# Patient Record
Sex: Male | Born: 1945 | Race: White | Hispanic: No | Marital: Married | Smoking: Former smoker
Health system: Southern US, Community
[De-identification: ages and names within clinical notes are randomized; demographics above are authoritative.]

## PROBLEM LIST (undated history)

## (undated) DIAGNOSIS — M1990 Unspecified osteoarthritis, unspecified site: Secondary | ICD-10-CM

## (undated) DIAGNOSIS — R351 Nocturia: Secondary | ICD-10-CM

## (undated) DIAGNOSIS — N210 Calculus in bladder: Secondary | ICD-10-CM

## (undated) DIAGNOSIS — N201 Calculus of ureter: Secondary | ICD-10-CM

## (undated) DIAGNOSIS — Z973 Presence of spectacles and contact lenses: Secondary | ICD-10-CM

## (undated) DIAGNOSIS — I10 Essential (primary) hypertension: Secondary | ICD-10-CM

## (undated) DIAGNOSIS — N400 Enlarged prostate without lower urinary tract symptoms: Secondary | ICD-10-CM

## (undated) DIAGNOSIS — I4719 Other supraventricular tachycardia: Secondary | ICD-10-CM

## (undated) DIAGNOSIS — Z87442 Personal history of urinary calculi: Secondary | ICD-10-CM

## (undated) DIAGNOSIS — R350 Frequency of micturition: Secondary | ICD-10-CM

## (undated) DIAGNOSIS — I4891 Unspecified atrial fibrillation: Secondary | ICD-10-CM

## (undated) DIAGNOSIS — F329 Major depressive disorder, single episode, unspecified: Secondary | ICD-10-CM

## (undated) DIAGNOSIS — F32A Depression, unspecified: Secondary | ICD-10-CM

## (undated) DIAGNOSIS — Z9189 Other specified personal risk factors, not elsewhere classified: Secondary | ICD-10-CM

## (undated) DIAGNOSIS — C61 Malignant neoplasm of prostate: Secondary | ICD-10-CM

## (undated) DIAGNOSIS — I471 Supraventricular tachycardia: Secondary | ICD-10-CM

## (undated) DIAGNOSIS — I714 Abdominal aortic aneurysm, without rupture, unspecified: Secondary | ICD-10-CM

## (undated) DIAGNOSIS — C3492 Malignant neoplasm of unspecified part of left bronchus or lung: Secondary | ICD-10-CM

## (undated) DIAGNOSIS — E785 Hyperlipidemia, unspecified: Secondary | ICD-10-CM

## (undated) HISTORY — DX: Hyperlipidemia, unspecified: E78.5

## (undated) HISTORY — DX: Abdominal aortic aneurysm, without rupture: I71.4

## (undated) HISTORY — DX: Major depressive disorder, single episode, unspecified: F32.9

## (undated) HISTORY — DX: Abdominal aortic aneurysm, without rupture, unspecified: I71.40

## (undated) HISTORY — DX: Depression, unspecified: F32.A

---

## 1990-12-12 HISTORY — PX: CHOLECYSTECTOMY OPEN: SUR202

## 2005-05-31 ENCOUNTER — Other Ambulatory Visit: Admission: RE | Admit: 2005-05-31 | Discharge: 2005-05-31 | Payer: Self-pay | Admitting: Family Medicine

## 2006-10-17 ENCOUNTER — Other Ambulatory Visit: Admission: RE | Admit: 2006-10-17 | Discharge: 2006-10-17 | Payer: Self-pay | Admitting: Family Medicine

## 2010-06-04 ENCOUNTER — Ambulatory Visit: Payer: Self-pay | Admitting: Vascular Surgery

## 2011-04-26 NOTE — Consult Note (Signed)
NEW PATIENT CONSULTATION   Russell [name]  DOB:  November 17, 1946                                       06/04/2010  [id number]#:[id number]   Patient presents today for evaluation of recent finding of above  abdominal aortic aneurysm.  He is a very active, pleasant gentleman who  was found to have hematuria.  He saw Dr. Bjorn Pippin for a workup of  this, and this included a CT scan which showed a 3.8 cm infrarenal  abdominal aortic aneurysm.  He has no known history of aneurysm, did  have a cerebral aneurysm in his father but otherwise no familial history  of aneurysm.   Past history is significant for depression, hypercholesterolemia.  He  denies any cardiac difficulty.   Surgical history is significant for a history of cholecystectomy.   Medications are listed in his chart.   He has no known allergies.   Review of systems is also documented in his chart.   SOCIAL HISTORY:  He does continue to drink alcohol.  He is a former  smoker who quit 24 years ago.   PHYSICAL EXAMINATION:  A well-nourished white male appearing stated age  of 88.  Blood pressure is 150/81, pulse 75, respirations 16.  He is in  no acute stress.  HEENT is normal.  Chest is clear bilaterally with no  rhonchi or wheezes.  Heart is a regular rate and rhythm without murmurs.  His carotid arteries are normal pulses without bruits bilaterally.  Abdominal exam is soft, nontender.  I do not palpate a palpable mass.  Musculoskeletal shows no major deformities or cyanosis.  Neurologic:  No  focal weakness or paresthesias.  Skin is without ulcers or rashes.   I reviewed his CT scan, and this does show some ectasia and mural  thrombus just at the level of the renal arteries and below.  He does  have a dilatation to 3.8 cm in the mid to distal aorta above the level  of the bifurcation.   I discussed this at length with patient, explaining that he is at  minimal, if any risk, for rupture with his very  small aneurysm.  I did  explain the natural history of aneurysms with their potential growth and  have recommended that we see him again in 1 year for ultrasound follow-  up.  I did review symptoms of aneurysm leak with him.  He knows to  report immediately to [facility] Room should this occur.     Larina Earthly, M.D.  Electronically Signed   TFE/MEDQ  D:  06/04/2010  T:  06/04/2010  Job:  [id number]   cc:   Russell [name], M.D.  Russell [name], M.D.

## 2011-06-07 ENCOUNTER — Ambulatory Visit: Payer: Self-pay | Admitting: Vascular Surgery

## 2011-06-07 ENCOUNTER — Encounter (INDEPENDENT_AMBULATORY_CARE_PROVIDER_SITE_OTHER): Payer: 59

## 2011-06-07 DIAGNOSIS — I714 Abdominal aortic aneurysm, without rupture: Secondary | ICD-10-CM

## 2011-06-13 NOTE — Procedures (Unsigned)
DUPLEX ULTRASOUND OF ABDOMINAL AORTA  INDICATION:  Follow up abdominal aortic aneurysm.  HISTORY: Diabetes:  No. Cardiac:  No. Hypertension:  No. Smoking:  Previous. Connective Tissue Disorder: Family History:  Brother. Previous Surgery:  No.  DUPLEX EXAM:         AP (cm)                   TRANSVERSE (cm) Proximal             1.79 cm                   1.77 cm Mid                  2.93 cm                   2.71 cm Distal               3.46 cm                   3.59 cm Right Iliac          1.01 cm                   0.90 cm Left Iliac           0.93 cm                   0.93 cm  PREVIOUS:  Date: 04/12/2010  (CT)  AP:  3.8  TRANSVERSE:  IMPRESSION: 1. No evidence of significant changes in abdominal aortic aneurysm     from CT done on 04/12/2010. 2. Aortic and common iliac artery velocities appear within normal     limits.  ___________________________________________ Larina Earthly, M.D.  AS/MEDQ  D:  06/07/2011  T:  06/07/2011  Job:  [id number]

## 2012-05-28 ENCOUNTER — Encounter: Payer: Self-pay | Admitting: Vascular Surgery

## 2012-06-11 ENCOUNTER — Encounter: Payer: Self-pay | Admitting: Vascular Surgery

## 2012-06-11 ENCOUNTER — Encounter: Payer: Self-pay | Admitting: Neurosurgery

## 2012-06-12 ENCOUNTER — Ambulatory Visit: Payer: 59 | Admitting: Neurosurgery

## 2012-06-18 ENCOUNTER — Encounter: Payer: Self-pay | Admitting: Neurosurgery

## 2012-06-19 ENCOUNTER — Ambulatory Visit (INDEPENDENT_AMBULATORY_CARE_PROVIDER_SITE_OTHER): Payer: PRIVATE HEALTH INSURANCE | Admitting: Neurosurgery

## 2012-06-19 ENCOUNTER — Encounter: Payer: Self-pay | Admitting: Neurosurgery

## 2012-06-19 ENCOUNTER — Ambulatory Visit (INDEPENDENT_AMBULATORY_CARE_PROVIDER_SITE_OTHER): Payer: PRIVATE HEALTH INSURANCE | Admitting: *Deleted

## 2012-06-19 VITALS — BP 153/83 | HR 66 | Resp 16 | Ht 72.0 in | Wt 197.2 lb

## 2012-06-19 DIAGNOSIS — I714 Abdominal aortic aneurysm, without rupture, unspecified: Secondary | ICD-10-CM

## 2012-06-19 NOTE — Progress Notes (Signed)
[facility] AAA/PAD/PVD Office Note  CC: Annual AAA duplex Referring Physician: Early  History of Present Illness: 66 year old male patient of Dr. Arbie Cookey followed for known AAA. Patient reports no unusual abdominal or back pain. He does state that he has some concern about his blood pressure which is 153/83 today. He will followup with his primary care regarding this issue. Patient reports no other medical diagnoses or recent surgeries, he also has no other vascular issues.  Past Medical History  Diagnosis Date  . AAA (abdominal aortic aneurysm)   . Hyperlipidemia   . Depression     ROS: [x]  Positive   [ ]  Denies    General: [ ]  Weight loss, [ ]  Fever, [ ]  chills Neurologic: [ ]  Dizziness, [ ]  Blackouts, [ ]  Seizure [ ]  Stroke, [ ]  "Mini stroke", [ ]  Slurred speech, [ ]  Temporary blindness; [ ]  weakness in arms or legs, [ ]  Hoarseness Cardiac: [ ]  Chest pain/pressure, [ ]  Shortness of breath at rest [ ]  Shortness of breath with exertion, [ ]  Atrial fibrillation or irregular heartbeat Vascular: [ ]  Pain in legs with walking, [ ]  Pain in legs at rest, [ ]  Pain in legs at night,  [ ]  Non-healing ulcer, [ ]  Blood clot in vein/DVT,   Pulmonary: [ ]  Home oxygen, [ ]  Productive cough, [ ]  Coughing up blood, [ ]  Asthma,  [ ]  Wheezing Musculoskeletal:  [ ]  Arthritis, [ ]  Low back pain, [ ]  Joint pain Hematologic: [ ]  Easy Bruising, [ ]  Anemia; [ ]  Hepatitis Gastrointestinal: [ ]  Blood in stool, [ ]  Gastroesophageal Reflux/heartburn, [ ]  Trouble swallowing Urinary: [ ]  chronic Kidney disease, [ ]  on HD - [ ]  MWF or [ ]  TTHS, [ ]  Burning with urination, [ ]  Difficulty urinating Skin: [ ]  Rashes, [ ]  Wounds Psychological: [ ]  Anxiety, [ ]  Depression   Social History History  Substance Use Topics  . Smoking status: Former Smoker    Types: Cigarettes    Quit date: 05/28/1986  . Smokeless tobacco: Not on file  . Alcohol Use: Yes    Family History Family  History  Problem Relation Age of Onset  . Anuerysm Father     No Known Allergies  Current Outpatient Prescriptions  Medication Sig Dispense Refill  . aspirin 81 MG tablet Take 81 mg by mouth daily.      Marland Kitchen FLUoxetine (PROZAC) 20 MG capsule Take 20 mg by mouth daily.      Marland Kitchen GLUCOSAMINE PO Take by mouth.      . Omega-3 Fatty Acids (FISH OIL) 1000 MG CAPS Take 1,000 mg by mouth daily.      . simvastatin (ZOCOR) 40 MG tablet Take 40 mg by mouth daily.        Physical Examination  Filed Vitals:   06/19/12 0908  BP: 153/83  Pulse: 66  Resp: 16    Body mass index is 26.75 kg/(m^2).  General:  WDWN in NAD Gait: Normal HEENT: WNL Eyes: Pupils equal Pulmonary: normal non-labored breathing , without Rales, rhonchi,  wheezing Cardiac: RRR, without  Murmurs, rubs or gallops; No carotid bruits Abdomen: soft, NT, no masses Skin: no rashes, ulcers noted Vascular Exam/Pulses: 2+ radial pulses bilaterally, femoral pulses are palpable no abdominal mass is palpated  Extremities without ischemic changes, no Gangrene , no cellulitis; no open wounds;  Musculoskeletal: no muscle wasting or atrophy  Neurologic: A&O X 3; Appropriate Affect ; SENSATION: normal; MOTOR FUNCTION:  moving all extremities equally. Speech is fluent/normal  Non-Invasive Vascular Imaging: Duplex of AAA today shows sac size to be 3.7 x 3.6 which is consistent with previous CT scan from 2011.  ASSESSMENT/PLAN: Asymptomatic AAA patient that will followup in one year with repeat AAA duplex. The patient knows signs and symptoms of rupture and knows to report to the nearest emergency department should that occur. The patient's questions were encouraged and answered. Next  Lauree Chandler ANP  Clinic M.D.: [facility] [place]

## 2012-06-19 NOTE — Addendum Note (Signed)
Addended by: Sharee Pimple on: 06/19/2012 10:45 AM   Modules accepted: Orders

## 2012-06-26 NOTE — Procedures (Unsigned)
DUPLEX ULTRASOUND OF ABDOMINAL AORTA  INDICATION:  Follow-up AAA.  HISTORY: Diabetes:  No Cardiac:  No Hypertension:  No Smoking:  Previous Connective Tissue Disorder: Family History:  Brother Previous Surgery:  No  DUPLEX EXAM:         AP (cm)                   TRANSVERSE (cm) Proximal             3.14 cm Mid                  See below Distal               1.54 cm                   1.60 cm Right Iliac          0.86 cm Left Iliac           0.95 cm  PREVIOUS:  Date: 06/07/2011  AP:  3.46  TRANSVERSE:  3.59  IMPRESSION:  Today's aortic diameter measures 3.7 cm x 3.6 cm; however, bowel gas is obstructing what is likely the largest segment of the aorta.  ___________________________________________ Larina Earthly, M.D.  LT/MEDQ  D:  06/19/2012  T:  06/19/2012  Job:  [id number]

## 2013-06-19 ENCOUNTER — Encounter (INDEPENDENT_AMBULATORY_CARE_PROVIDER_SITE_OTHER): Payer: Medicare Other | Admitting: *Deleted

## 2013-06-19 ENCOUNTER — Ambulatory Visit: Payer: PRIVATE HEALTH INSURANCE | Admitting: Neurosurgery

## 2013-06-19 DIAGNOSIS — I714 Abdominal aortic aneurysm, without rupture: Secondary | ICD-10-CM

## 2013-06-21 ENCOUNTER — Other Ambulatory Visit: Payer: Self-pay | Admitting: *Deleted

## 2013-06-21 DIAGNOSIS — I714 Abdominal aortic aneurysm, without rupture: Secondary | ICD-10-CM

## 2013-06-25 ENCOUNTER — Encounter: Payer: Self-pay | Admitting: Vascular Surgery

## 2014-06-30 ENCOUNTER — Encounter: Payer: Self-pay | Admitting: Family

## 2014-07-01 ENCOUNTER — Other Ambulatory Visit (HOSPITAL_COMMUNITY): Payer: Medicare Other

## 2014-07-01 ENCOUNTER — Ambulatory Visit: Payer: Medicare Other | Admitting: Family

## 2015-01-26 ENCOUNTER — Ambulatory Visit (INDEPENDENT_AMBULATORY_CARE_PROVIDER_SITE_OTHER): Payer: Medicare Other | Admitting: Internal Medicine

## 2015-01-26 ENCOUNTER — Other Ambulatory Visit (INDEPENDENT_AMBULATORY_CARE_PROVIDER_SITE_OTHER): Payer: Medicare Other

## 2015-01-26 ENCOUNTER — Encounter (INDEPENDENT_AMBULATORY_CARE_PROVIDER_SITE_OTHER): Payer: Self-pay

## 2015-01-26 ENCOUNTER — Encounter: Payer: Self-pay | Admitting: Internal Medicine

## 2015-01-26 VITALS — BP 152/88 | HR 72 | Temp 97.7°F | Ht 72.0 in | Wt 193.0 lb

## 2015-01-26 DIAGNOSIS — R918 Other nonspecific abnormal finding of lung field: Secondary | ICD-10-CM

## 2015-01-26 DIAGNOSIS — I714 Abdominal aortic aneurysm, without rupture, unspecified: Secondary | ICD-10-CM

## 2015-01-26 LAB — CREATININE, SERUM: Creatinine, Ser: 1.01 mg/dL (ref 0.40–1.50)

## 2015-01-26 LAB — BUN: BUN: 19 mg/dL (ref 6–23)

## 2015-01-26 NOTE — Progress Notes (Signed)
Subjective:     Patient ID: Russell [name], male   DOB: 1946-02-25, 69 y.o.   MRN: [id number]  HPI  89 yowm quit smoking 2009 referred by Dr Irine Seal 01/26/2015 to pulmonary clinic for eval of incidental lung mass on CT abd/ primary care per Theadore Nan   01/26/2015 [facility] office visit/ Wert   Chief Complaint  Patient presents with  . Advice Only    Ref by Dr. Jeffie Pollock. nodule seen on scan; not having any SOB, cough, f/n/v/d/  no symptoms and able to yardwork fine/ no problem with walks / hills Nodule in LLL not seen abd  CT 2011 (last xray per pt)  abd pain from stones resolved   No assoc chronic cough or cp or chest tightness, subjective wheeze overt sinus or hb symptoms. No unusual exp hx or h/o childhood pna/ asthma or knowledge of premature birth.  Sleeping ok without nocturnal  or early am exacerbation  of respiratory  c/o's or need for noct saba. Also denies any obvious fluctuation of symptoms with weather or environmental changes or other aggravating or alleviating factors except as outlined above   Current Medications, Allergies, Complete Past Medical History, Past Surgical History, Family History, and Social History were reviewed in [employer] record.  ROS  The following are not active complaints unless bolded sore throat, dysphagia, dental problems, itching, sneezing,  nasal congestion or excess/ purulent secretions, ear ache,   fever, chills, sweats, unintended wt loss, pleuritic or exertional cp, hemoptysis,  orthopnea pnd or leg swelling, presyncope, palpitations, heartburn, abdominal pain, anorexia, nausea, vomiting, diarrhea  or change in bowel or urinary habits, change in stools or urine, dysuria,hematuria,  rash, arthralgias, visual complaints, headache, numbness weakness or ataxia or problems with walking or coordination,  change in mood/affect or memory.          Review of Systems     Objective:   Physical Exam   Pleasant  amb wm   Wt Readings from Last 3 Encounters:  01/26/15 193 lb (87.544 kg)  06/19/12 197 lb 3.2 oz (89.449 kg)    Vital signs reviewed     HEENT: nl dentition, turbinates, and orophanx. Nl external ear canals without cough reflex   NECK :  without JVD/Nodes/TM/ nl carotid upstrokes bilaterally   LUNGS: no acc muscle use, clear to A and P bilaterally without cough on insp or exp maneuvers   CV:  RRR  no s3 or murmur or increase in P2, no edema   ABD:  soft and nontender with nl excursion in the supine position. No bruits or organomegaly, bowel sounds nl  MS:  warm without deformities, calf tenderness, cyanosis or clubbing  SKIN: warm and dry without lesions    NEURO:  alert, approp, no deficits    I personally reviewed images and agree with radiology impression as follows:  CT abd 01/23/15 3.8 x 3.5 cm LLL nodule plus 4.4 cm infrarenal AAA         Assessment:

## 2015-01-26 NOTE — Assessment & Plan Note (Addendum)
-   See CT abd 01/23/15 3.8 x 3.5 cm LLL nodule plus 4.4 cm infrarenal AAA  - Spirometry 01/26/2015 >  FEV1  3.14 (86%) ratio 83   I had an extended discussion with the patient reviewing all relevant studies completed to date and  lasting 40  minutes of a 60  minute visit on the following ongoing concerns:  This lesion is clearly new since 2011 prev abd CT and I agree with radiology it represents bronchogenic ca until proven otherwise in this former smoker.  On the other hand, it may well prove to be resectable for cure given his excellent lung function  Discussed in detail all the  indications, usual  risks and alternatives  relative to the benefits with patient who agrees to proceed with CT chest first then PET then T surgery next if proves resectable as suspected/ emphasizing the best way to firmly establish dx in this case is excisional bx which may also lead to curative resection in same operation and it this setting a neg bx by any other means would be more likely a false neg than a true negative and the posterior probability is quite high.    See instructions for specific recommendations which were reviewed directly with the patient who was given a copy with highlighter outlining the key components.

## 2015-01-26 NOTE — Assessment & Plan Note (Signed)
See CT abd 01/23/15  =  4 cm infrarenal AAA f/u by Dr Early last seen 06/19/12 with no def change in diameter since then

## 2015-01-26 NOTE — Patient Instructions (Signed)
Please see patient coordinator before you leave today  to schedule CT chest and we will call you with results and arrange PET next

## 2015-01-29 ENCOUNTER — Ambulatory Visit (INDEPENDENT_AMBULATORY_CARE_PROVIDER_SITE_OTHER)
Admission: RE | Admit: 2015-01-29 | Discharge: 2015-01-29 | Disposition: A | Discharge status: Home / Self Care | Payer: Medicare Other | Source: Ambulatory Visit | Attending: Internal Medicine | Admitting: Internal Medicine

## 2015-01-29 DIAGNOSIS — R918 Other nonspecific abnormal finding of lung field: Secondary | ICD-10-CM

## 2015-01-29 MED ORDER — IOHEXOL 300 MG/ML  SOLN
80.0000 mL | Freq: Once | INTRAMUSCULAR | Status: AC | PRN
  Administered 2015-01-29: 80 mL via INTRAVENOUS

## 2015-01-30 ENCOUNTER — Other Ambulatory Visit: Payer: Self-pay | Admitting: Internal Medicine

## 2015-01-30 DIAGNOSIS — R918 Other nonspecific abnormal finding of lung field: Secondary | ICD-10-CM

## 2015-02-04 ENCOUNTER — Other Ambulatory Visit: Payer: Self-pay | Admitting: Internal Medicine

## 2015-02-04 ENCOUNTER — Ambulatory Visit (HOSPITAL_COMMUNITY)
Admission: RE | Admit: 2015-02-04 | Discharge: 2015-02-04 | Disposition: A | Discharge status: Home / Self Care | Payer: Medicare Other | Source: Ambulatory Visit | Attending: Internal Medicine | Admitting: Internal Medicine

## 2015-02-04 DIAGNOSIS — R918 Other nonspecific abnormal finding of lung field: Secondary | ICD-10-CM

## 2015-02-04 LAB — GLUCOSE, CAPILLARY: Glucose-Capillary: 103 mg/dL — ABNORMAL HIGH (ref 70–99)

## 2015-02-04 MED ORDER — FLUDEOXYGLUCOSE F - 18 (FDG) INJECTION
9.5000 | Freq: Once | INTRAVENOUS | Status: AC | PRN
  Administered 2015-02-04: 9.5 via INTRAVENOUS

## 2015-02-04 NOTE — Progress Notes (Signed)
Quick Note:  Spoke with pt and notified of results per Dr. Wert. Pt verbalized understanding and denied any questions.  ______ 

## 2015-02-11 ENCOUNTER — Institutional Professional Consult (permissible substitution) (INDEPENDENT_AMBULATORY_CARE_PROVIDER_SITE_OTHER): Payer: Medicare Other | Admitting: Thoracic Surgery (Cardiothoracic Vascular Surgery)

## 2015-02-11 ENCOUNTER — Other Ambulatory Visit: Payer: Self-pay | Admitting: *Deleted

## 2015-02-11 ENCOUNTER — Encounter: Payer: Self-pay | Admitting: Thoracic Surgery (Cardiothoracic Vascular Surgery)

## 2015-02-11 VITALS — BP 132/73 | HR 72 | Resp 16 | Ht 72.0 in | Wt 190.0 lb

## 2015-02-11 DIAGNOSIS — R319 Hematuria, unspecified: Secondary | ICD-10-CM | POA: Insufficient documentation

## 2015-02-11 DIAGNOSIS — J984 Other disorders of lung: Secondary | ICD-10-CM

## 2015-02-11 DIAGNOSIS — R918 Other nonspecific abnormal finding of lung field: Secondary | ICD-10-CM

## 2015-02-11 DIAGNOSIS — I2510 Atherosclerotic heart disease of native coronary artery without angina pectoris: Secondary | ICD-10-CM

## 2015-02-11 NOTE — Progress Notes (Addendum)
PCP is MCNEILL,WENDY, MD Referring Provider is Tanda Rockers, MD  Urologist: Irine Seal, MD  Chief Complaint  Patient presents with  . Lung Mass    LLLobe....Marland KitchenCT CHEST 01/29/15.Marland KitchenPET 02/04/15...only spirometry done    HPI: 69 year old man sent for consultation regarding a left lower lobe mass.  Mr. 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 He has a history of kidney stones and hematuria. He recently had recurrent hematuria. As part of his workup he had a CT of the abdomen and pelvis. A left lower lobe mass was noted on the CT scan. He then had a CT of the chest which showed a 3.5 cm spiculated mass in the left lower lobe. There was some mild hilar adenopathy. There was pulmonary fibrosis noted. A small nodule in the right upper lobe showed calcification. He then had a PET/CT which showed the left lower lobe mass was hypermetabolic. The right upper lobe nodule was not hypermetabolic. There was no evidence of regional or distant metastases.  He says that he walks on a daily basis. He can walk about a mile without any shortness of breath chest pain, pressure, or tightness. He can walk up and down stairs without difficulty. He denies cough, hemoptysis, wheezing, shortness of breath, peripheral edema. His weight is unchanged over the past 3 in 6 months.  Zubrod Score: At the time of surgery this patient's most appropriate activity status/level should be described as: [x]     0    Normal activity, no symptoms []     1    Restricted in physical strenuous activity but ambulatory, able to do out light work []     2    Ambulatory and capable of self care, unable to do work activities, up and about >50 % of waking hours                              []     3    Only limited self care, in bed greater than 50% of waking hours []     4    Completely disabled, no self care, confined to bed or chair []     5     Moribund    Past Medical History  Diagnosis Date  . AAA (abdominal aortic aneurysm)   . Hyperlipidemia   . Depression     Past Surgical History  Procedure Laterality Date  . Cholecystectomy      Family History  Problem Relation Age of Onset  . Anuerysm Father     Social History History  Substance Use Topics  . Smoking status: Former Smoker -- 1.00 packs/day for 50 years    Types: Cigarettes    Quit date: 12/13/2007  . Smokeless tobacco: Never Used  . Alcohol Use: 0.0 oz/week    0 Standard drinks or equivalent per week    Current Outpatient Prescriptions  Medication Sig Dispense Refill  . aspirin 81 MG tablet Take 81 mg by mouth daily.    Marland Kitchen FLUoxetine (PROZAC) 20 MG capsule Take 20 mg by mouth daily.    Marland Kitchen GLUCOSAMINE PO Take by mouth.    . losartan (COZAAR) 50 MG tablet Take 50 mg by mouth daily.    . Omega-3 Fatty Acids (FISH OIL) 1000 MG CAPS Take 1,000 mg by mouth daily.    . simvastatin (ZOCOR) 40 MG tablet  Take 40 mg by mouth daily.     No current facility-administered medications for this visit.    No Known Allergies  Review of Systems  Constitutional: Negative for fever, chills, activity change, appetite change, fatigue and unexpected weight change.  Respiratory: Negative for cough, shortness of breath, wheezing and stridor.   Cardiovascular: Negative for chest pain and leg swelling.  Genitourinary: Positive for hematuria.  Hematological: Negative for adenopathy. Does not bruise/bleed easily.  Psychiatric/Behavioral: Positive for dysphoric mood.  All other systems reviewed and are negative.   BP 132/73 mmHg  Pulse 72  Resp 16  Ht 6' (1.829 m)  Wt 190 lb (86.183 kg)  BMI 25.76 kg/m2  SpO2 97% Physical Exam  Constitutional: He is oriented to person, place, and time. He appears well-developed and well-nourished. No distress.  HENT:  Head: Normocephalic and atraumatic.  Eyes: EOM are normal. Pupils are equal, round, and reactive to light.  Neck:  Neck supple. No thyromegaly present.  Cardiovascular: Normal rate, regular rhythm, normal heart sounds and intact distal pulses.  Exam reveals no gallop and no friction rub.   No murmur heard. Pulmonary/Chest: Effort normal and breath sounds normal. He has no wheezes. He has no rales.  Abdominal: Soft. There is no tenderness.  Musculoskeletal: He exhibits no edema.  Lymphadenopathy:    He has no cervical adenopathy.  Neurological: He is alert and oriented to person, place, and time. No cranial nerve deficit.  Skin: Skin is warm and dry.  Psychiatric: He has a normal mood and affect.  Vitals reviewed.    Diagnostic Tests: NUCLEAR MEDICINE PET SKULL BASE TO THIGH  TECHNIQUE: 9.5 mCi F-18 FDG was injected intravenously. Full-ring PET imaging was performed from the skull base to thigh after the radiotracer. CT data was obtained and used for attenuation correction and anatomic localization.  FASTING BLOOD GLUCOSE: Value: One hundred three mg/dl  COMPARISON: CT chest 01/29/2015 and CT abdomen pelvis 01/23/2015.  FINDINGS: NECK  No hypermetabolic lymph nodes in the neck. CT images show no acute findings.  CHEST  A 3.4 x 4.0 cm spiculated mass in the superior segment left lower lobe has an SUV max of 15.3. No hypermetabolic mediastinal, hilar or axillary lymph nodes. CT images show no acute findings. Coronary artery calcification. Heart is mildly enlarged. No pericardial effusion. Centrilobular emphysema.  ABDOMEN/PELVIS  No abnormal hypermetabolism in the liver, adrenal glands, spleen or pancreas. No hypermetabolic lymph nodes. There is focal hypermetabolism in the low central portion of the prostate. CT images show a 1.5 cm low-attenuation lesion in the left hepatic lobe, unchanged from 04/12/2010, favoring a cyst. Cholecystectomy. Adrenal glands and right kidney are unremarkable. Left renal stones. A 9 mm stone is again seen in the distal left ureter, at the  left ureterovesical junction. 10 mm stone is seen dependently in the bladder, stable. Spleen, pancreas, stomach and bowel are grossly unremarkable. No free fluid. Atherosclerotic calcification of the arterial vasculature. Infrarenal aortic aneurysm measures 4.3 cm.  SKELETON  No abnormal osseous hypermetabolism. Degenerative changes are seen in the spine.  IMPRESSION: 1. Hypermetabolic spiculated left lower lobe mass without evidence of metastatic disease. 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 2. Focal hypermetabolism in the low central prostate is nonspecific. Consider correlation with PSA, as clinically indicated. 3. Coronary artery calcification. 4. Left renal stones. Stones are again seen in the distal left ureter and bladder. 5. Infrarenal aortic aneurysm, as on 01/23/2015.   Electronically  Signed  By: Lorin Picket M.D.  On: 02/04/2015 10:32  Pulmonary function tests   FVC= 3.77 (78%) FEV1= 3.14(86%) FEV1/FVC= 83%  Impression: 69 year old man with a newly discovered 3.5 cm spiculated mass in the left lower lobe. This is hypermetabolic by PET CT. Given his age, smoking history, and appearance of the lesion this is a lung cancer until proven otherwise.  He has excellent functional status. His FEV1 is 86% predicted. He has coronary calcification on CT scan, but is completely asymptomatic. Overall he is an excellent surgical candidate.  I discussed an algorithm for diagnosis and treatment. I discussed possibility of doing a CT-guided biopsy or bronchoscopic biopsy. If those were positive it would not change management. Also they were negative I would not rely on that and would still recommend surgical resection. Therefore see no reason to do a preoperative biopsy.  I recommended that we proceed directly to left VATS, wedge resection, possible left lower lobectomy. I described the general nature of the  procedure to him including the need for general anesthesia, the incisions to be used, the intraoperative decision making, the expected hospital stay, and overall recovery. I reviewed the indications, risks, benefits, and alternatives. He understands the risk include, but are not limited to death, MI, DVT, PE, bleeding, possible need for transfusion, infection, prolonged air leak, cardiac arrhythmias, as well as possibility of unforeseeable competitions. We also discussed the possibility of cryo-analgesia intercostal nerves to assist with postoperative pain management. He does wish to have that procedure done as well.  He understands and accepts the risks and wishes to proceed.  Plan:  Left VATS, wedge resection, possible left lower lobectomy, cryo-analgesia intercostal nerves on Monday, March 14.  Revonda Standard Roxan Hockey, MD [facility] [phone]

## 2015-02-16 ENCOUNTER — Other Ambulatory Visit: Payer: Self-pay | Admitting: Urology

## 2015-02-16 ENCOUNTER — Encounter (HOSPITAL_BASED_OUTPATIENT_CLINIC_OR_DEPARTMENT_OTHER): Payer: Self-pay | Admitting: *Deleted

## 2015-02-16 ENCOUNTER — Ambulatory Visit: Payer: Self-pay | Admitting: Adult Health

## 2015-02-17 ENCOUNTER — Encounter (HOSPITAL_BASED_OUTPATIENT_CLINIC_OR_DEPARTMENT_OTHER): Payer: Self-pay | Admitting: *Deleted

## 2015-02-17 NOTE — Progress Notes (Addendum)
NPO AFTER MN. ARRIVE AT 0700. NEEDS ISTAT AND EKG. WILL TAKE PROZAC AM DOS W/ SIPS OF WATER AND DO FLEET ENEMA.

## 2015-02-18 NOTE — H&P (Signed)
Active Problems Problems  1. Aneurysm Of The Abdominal Aorta 2. Benign prostatic hyperplasia with urinary obstruction (N40.1,N13.8) 3. Bladder calculus (N21.0) 4. Calculus of left ureter (N20.1) 5. Elevated prostate specific antigen (PSA) (R97.2) 6. Mass of left lung (R91.8) 7. Microscopic hematuria (R31.2) 8. Nephrolithiasis (N20.0) 9. Nocturia (R35.1) 10. Nodular prostate with lower urinary tract symptoms (N40.3) 11. Pyuria (N39.0)  History of Present Illness Mr. Kerwin returns today in f/u. He was to have cystoscopy and a prostate biopsy. He was found on CT to have a lung mass and is slated for a resection on Monday. He had a 1cm bladder stone and 61mm non-obstructing left distal stone on the CT as well.  He is currently without complaints.   Past Medical History Problems  1. History of abdominal aortic aneurysm (Z86.79) 2. History of depression (Z86.59) 3. History of hypercholesterolemia (Z86.39) 4. History of hypertension (Z86.79)  Surgical History Problems  1. History of Cholecystectomy  Current Meds 1. Aspirin 81 MG Oral Tablet;  Therapy: (Recorded:16Feb2011) to Recorded 2. BuPROPion HCl ER (XL) 300 MG Oral Tablet Extended Release 24 Hour;  Therapy: (Recorded:16Feb2011) to Recorded 3. Fish Oil CAPS;  Therapy: (Recorded:16Feb2011) to Recorded 4. FLUoxetine HCl - 20 MG Oral Capsule;  Therapy: (Recorded:16Feb2011) to Recorded 5. Glucosamine CAPS;  Therapy: (Recorded:16Feb2011) to Recorded 6. Levofloxacin 500 MG Oral Tablet; Take 1 Tab the day before the procedure, Take 1 the day  of the procedure and 1 tab the day after procedure;  Therapy: [id number] to (Last Rx:08Feb2016)  Requested for: [id number] Ordered 7. Levofloxacin 500 MG Oral Tablet; Take one tablet daily starting day before procedure;  Therapy: [id number] to (Evaluate:11Feb2016)  Requested for: [id number]; Last  Rx:08Feb2016 Ordered 8. Losartan Potassium 50 MG Oral Tablet;  Therapy: (Recorded:08Feb2016) to  Recorded 9. Multi-Vitamin TABS;  Therapy: (Recorded:08Feb2016) to Recorded 10. Simvastatin 40 MG Oral Tablet;   Therapy: (Recorded:16Feb2011) to Recorded  Allergies No Known Allergies  1. No Known Allergies  Family History Problems  1. Family history of Aneurysm : Father 2. Family history of Bladder Cancer : Brother 3. Family history of Death In The Family Father   died age 66 aneurysm 12. Family history of Death In The Family Mother   died age 21-cause unknown 46. Family history of kidney stones (Z84.1) : Son 6. Family history of Prostate Cancer : Father  Social History Problems  1. Alcohol Use 2. Caffeine Use   1 per day 3. Former smoker [phone])   Smoked 1 ppd for 45 yrs and quit over 5 years ago 4. Marital History - Currently Married 5. Occupation:   [employer] 6. Two children  Review of Systems  Genitourinary: no hematuria.  Cardiovascular: no chest pain.  Respiratory: no shortness of breath.    Physical Exam Constitutional: Well nourished and well developed . No acute distress.  Pulmonary: No respiratory distress and normal respiratory rhythm and effort.  Cardiovascular: Heart rate and rhythm are normal . No peripheral edema.    Results/Data Urine [Data Includes: Last 1 Day]   [id number]  COLOR AMBER   APPEARANCE CLEAR   SPECIFIC GRAVITY 1.025   pH 6.5   GLUCOSE NEG mg/dL  BILIRUBIN NEG   KETONE TRACE mg/dL  BLOOD SMALL   PROTEIN TRACE mg/dL  UROBILINOGEN 1 mg/dL  NITRITE NEG   LEUKOCYTE ESTERASE NEG   SQUAMOUS EPITHELIAL/HPF RARE   WBC 0-2 WBC/hpf  RBC 0-2 RBC/hpf  BACTERIA RARE   CRYSTALS NONE SEEN   CASTS NONE SEEN    The  following images/tracing/specimen were independently visualized:  CT films and report reviewed. Chest Ct report reviewed. Selected Results  AU CT-HEMATURIA PROTOCOL [id number] 12:00AM Irine Seal   Test Name Result Flag Reference  AU CT-HEMATURIA PROTOCOL (Report)    ** RADIOLOGY REPORT BY [facility] **   CLINICAL DATA: Recurrent micro hematuria, initially discovered in 2011. History of renal calculi and BPH. No history of malignancy. Initial encounter.  EXAM: CT ABDOMEN AND PELVIS WITHOUT AND WITH CONTRAST  TECHNIQUE: Multidetector CT imaging of the abdomen and pelvis was performed following the standard protocol before and following the bolus administration of intravenous contrast.  CONTRAST: 125 ml Isovue-300.  COMPARISON: Abdominal pelvic CT 04/12/2010.  FINDINGS: Lower chest: Mild emphysema and subpleural reticulation at both lung bases. There is a macrolobulated and slightly spiculated left lower lobe mass which measures 3.8 x 3.5 cm, best seen on image 5 of series 8. This is highly worrisome for bronchogenic carcinoma.  Hepatobiliary: There are small cysts within the left hepatic lobe, measuring 13 mm on image 13 and 8 mm on image 16 of series 4. No suspicious liver lesions. No biliary dilatation status post cholecystectomy.  Pancreas: Unremarkable. No pancreatic ductal dilatation or surrounding inflammatory changes.  Spleen: Normal in size without focal abnormality.  Adrenals/Urinary Tract: Both adrenal glands appear normal.Pre contrast images demonstrate 2 small calculi in the interpolar region of the left kidney, the larger measuring 4 mm on image 26. There is no evidence of right-sided urinary tract calculus or hydronephrosis. However, there is a minimally obstructing large calculus in the distal left ureter, measuring 12 mm on image 69. There is also a 9 mm bladder calculus on the same image. Post-contrast, both kidneys enhance normally. There is no evidence of enhancing renal mass. Tiny low-density left renal lesion is grossly unchanged. Delayed images result in segmental visualization of the ureters. No urothelial abnormalities are identified. The bladder appears unremarkable.  Stomach/Bowel: No evidence of bowel wall thickening,  distention or surrounding inflammatory change.  Vascular/Lymphatic: There are no enlarged abdominal or pelvic lymph nodes. There is diffuse aortoiliac atherosclerosis with a 4.4 cm fusiform infrarenal abdominal aortic aneurysm. Suspected ostial stenosis of the superior mesenteric artery. There is also prominent calcification near the ostia of both renal arteries.  Reproductive: Mild heterogeneous enlargement of the prostate gland which protrudes into the bladder base. There are dystrophic calcifications centrally within the prostate gland.  Other: No evidence of abdominal wall mass or hernia.  Musculoskeletal: No acute or significant osseous findings.  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 No evidence of metastatic disease. Oncology referral and PET-CT recommended for further evaluation. *Large minimally obstructing distal left ureteral calculus. In addition, there are bladder and small left renal calculi. *4.4 cm infrarenal abdominal aortic aneurysm with possible ostial stenosis of the superior mesenteric and renal arteries. Recommend follow up by [place] (or CT) in 1year. This recommendation follows ACR consensus guidelines: White Paper of the ACR Incidental Findings Committee II on Vascular Findings. Joellyn Rued [id number]; 73:419-379. *Prostatomegaly. * These results will be called to the ordering clinician or representative by the Radiologist Assistant, and communication documented in the PACS or zVision Dashboard.   Electronically Signed  By: Richardean Sale M.D.  On: 01/23/2015 15:36   Procedure  Procedure: Cystoscopy   Indication: Hematuria.  Informed Consent: Risks, benefits, and potential adverse events were discussed and informed consent was obtained from the patient.  Prep: The patient was prepped with betadine.  Antibiotic prophylaxis:  Levofloxacin.  Procedure Note:  Urethral meatus:. No abnormalities.  Anterior  urethra: No abnormalities.  Prostatic urethra: No abnormalities . Estimated length was 3 cm. An enlarged intravesical median lobe was visualized. (small but he has a widely patent prostatic urethra distal to the middle lobe. There is some obstruction. ).  Bladder: Visulization was clear. The ureteral orifices were in the normal anatomic position bilaterally and had clear efflux of urine. The mucosa was smooth without abnormalities. Examination of the bladder demonstrated mild trabeculation. (There is a 1cm stone that looks like calcium oxalate and 2 tiny stones. ) The patient tolerated the procedure well.  Complications: None.    Assessment Assessed  1. Bladder calculus (N21.0) 2. Calculus of left ureter (N20.1) 3. Nodular prostate with lower urinary tract symptoms (N40.3) 4. Elevated prostate specific antigen (PSA) (R97.2)  He has no bladder tumors but has a 28mm bladder stone and an 11mm left distal stone without obstruction.   Plan Health Maintenance  1. UA With REFLEX; [Do Not Release]; Status:Resulted - Requires Verification;   Done:  [id number] 10:54AM Nodular prostate with lower urinary tract symptoms  2. Follow-up Schedule Surgery Office  Follow-up  Status: Hold For - Appointment   Requested for: [phone]  I am going to postpone the prostate biopsy today and will arrange to go to the OR this week for cystoscopy with bladder stone removal, left ureteroscopy with stone extraction and possible stent and prostate [place] and biopsy to try to get all of this done before his lung surgery.  I reviewed the risks of bleeding, infection, ureteral and bladder injury, need for a stent, thrombotic events and anesthetic risks.   I will have him break the levaquin in half and take 250mg  daily until the procedure on Thursday. .   Discussion/Summary CC: Dr. Theadore Nan and Dr. Merilynn Finland.

## 2015-02-18 NOTE — Anesthesia Preprocedure Evaluation (Addendum)
Anesthesia Evaluation  Patient identified by MRN, date of birth, ID band Patient awake    Reviewed: Allergy & Precautions, H&P , NPO status , Patient's Chart, lab work & pertinent test results  Airway Mallampati: II  TM Distance: >3 FB Neck ROM: full    Dental  (+) Dental Advisory Given, Caps All upper front are capped:   Pulmonary neg pulmonary ROS, former smoker,  Stop bang 5. Lung mass  Lower lobe left. breath sounds clear to auscultation  Pulmonary exam normal       Cardiovascular Exercise Tolerance: Good hypertension, Pt. on medications + CAD Rhythm:regular Rate:Normal  AAA 4.4 cm. Coronary artery calcification seen on CT   Neuro/Psych negative neurological ROS  negative psych ROS   GI/Hepatic negative GI ROS, Neg liver ROS,   Endo/Other  negative endocrine ROS  Renal/GU negative Renal ROS  negative genitourinary   Musculoskeletal   Abdominal   Peds  Hematology negative hematology ROS (+)   Anesthesia Other Findings   Reproductive/Obstetrics negative OB ROS                          Anesthesia Physical Anesthesia Plan  ASA: III  Anesthesia Plan: General   Post-op Pain Management:    Induction: Intravenous  Airway Management Planned: LMA  Additional Equipment:   Intra-op Plan:   Post-operative Plan:   Informed Consent: I have reviewed the patients History and Physical, chart, labs and discussed the procedure including the risks, benefits and alternatives for the proposed anesthesia with the patient or authorized representative who has indicated his/her understanding and acceptance.   Dental Advisory Given  Plan Discussed with: CRNA and Surgeon  Anesthesia Plan Comments:         Anesthesia Quick Evaluation

## 2015-02-19 ENCOUNTER — Encounter (HOSPITAL_BASED_OUTPATIENT_CLINIC_OR_DEPARTMENT_OTHER)
Admission: RE | Disposition: A | Discharge status: Home / Self Care | Payer: Self-pay | Source: Ambulatory Visit | Attending: Urology

## 2015-02-19 ENCOUNTER — Inpatient Hospital Stay (HOSPITAL_COMMUNITY): Admission: RE | Admit: 2015-02-19 | Payer: Medicare Other | Source: Ambulatory Visit

## 2015-02-19 ENCOUNTER — Ambulatory Visit (HOSPITAL_BASED_OUTPATIENT_CLINIC_OR_DEPARTMENT_OTHER): Payer: Medicare Other | Admitting: Anesthesiology

## 2015-02-19 ENCOUNTER — Other Ambulatory Visit: Payer: Self-pay

## 2015-02-19 ENCOUNTER — Ambulatory Visit (HOSPITAL_BASED_OUTPATIENT_CLINIC_OR_DEPARTMENT_OTHER)
Admission: RE | Admit: 2015-02-19 | Discharge: 2015-02-19 | Disposition: A | Discharge status: Home / Self Care | Payer: Medicare Other | Source: Ambulatory Visit | Attending: Urology | Admitting: Urology

## 2015-02-19 ENCOUNTER — Encounter (HOSPITAL_BASED_OUTPATIENT_CLINIC_OR_DEPARTMENT_OTHER): Payer: Self-pay | Admitting: *Deleted

## 2015-02-19 DIAGNOSIS — E780 Pure hypercholesterolemia: Secondary | ICD-10-CM | POA: Diagnosis not present

## 2015-02-19 DIAGNOSIS — Z7982 Long term (current) use of aspirin: Secondary | ICD-10-CM | POA: Insufficient documentation

## 2015-02-19 DIAGNOSIS — C61 Malignant neoplasm of prostate: Secondary | ICD-10-CM | POA: Insufficient documentation

## 2015-02-19 DIAGNOSIS — I10 Essential (primary) hypertension: Secondary | ICD-10-CM | POA: Diagnosis not present

## 2015-02-19 DIAGNOSIS — Z8679 Personal history of other diseases of the circulatory system: Secondary | ICD-10-CM | POA: Insufficient documentation

## 2015-02-19 DIAGNOSIS — N201 Calculus of ureter: Secondary | ICD-10-CM | POA: Diagnosis present

## 2015-02-19 DIAGNOSIS — Z87891 Personal history of nicotine dependence: Secondary | ICD-10-CM | POA: Insufficient documentation

## 2015-02-19 DIAGNOSIS — N3289 Other specified disorders of bladder: Secondary | ICD-10-CM | POA: Diagnosis not present

## 2015-02-19 DIAGNOSIS — F329 Major depressive disorder, single episode, unspecified: Secondary | ICD-10-CM | POA: Diagnosis not present

## 2015-02-19 DIAGNOSIS — Z9049 Acquired absence of other specified parts of digestive tract: Secondary | ICD-10-CM | POA: Insufficient documentation

## 2015-02-19 DIAGNOSIS — N403 Nodular prostate with lower urinary tract symptoms: Secondary | ICD-10-CM | POA: Diagnosis not present

## 2015-02-19 DIAGNOSIS — N210 Calculus in bladder: Secondary | ICD-10-CM | POA: Diagnosis not present

## 2015-02-19 DIAGNOSIS — Z792 Long term (current) use of antibiotics: Secondary | ICD-10-CM | POA: Insufficient documentation

## 2015-02-19 HISTORY — DX: Calculus of ureter: N20.1

## 2015-02-19 HISTORY — PX: CYSTOSCOPY/RETROGRADE/URETEROSCOPY/STONE EXTRACTION WITH BASKET: SHX5317

## 2015-02-19 HISTORY — DX: Nocturia: R35.1

## 2015-02-19 HISTORY — DX: Benign prostatic hyperplasia without lower urinary tract symptoms: N40.0

## 2015-02-19 HISTORY — DX: Frequency of micturition: R35.0

## 2015-02-19 HISTORY — DX: Essential (primary) hypertension: I10

## 2015-02-19 HISTORY — PX: PROSTATE BIOPSY: SHX241

## 2015-02-19 HISTORY — DX: Personal history of urinary calculi: Z87.442

## 2015-02-19 HISTORY — DX: Other specified personal risk factors, not elsewhere classified: Z91.89

## 2015-02-19 HISTORY — DX: Presence of spectacles and contact lenses: Z97.3

## 2015-02-19 HISTORY — DX: Calculus in bladder: N21.0

## 2015-02-19 HISTORY — DX: Unspecified osteoarthritis, unspecified site: M19.90

## 2015-02-19 LAB — POCT I-STAT 4, (NA,K, GLUC, HGB,HCT)
Glucose, Bld: 116 mg/dL — ABNORMAL HIGH (ref 70–99)
HCT: 44 % (ref 39.0–52.0)
HEMOGLOBIN: 15 g/dL (ref 13.0–17.0)
Potassium: 4.4 mmol/L (ref 3.5–5.1)
SODIUM: 143 mmol/L (ref 135–145)

## 2015-02-19 SURGERY — CYSTOSCOPY, WITH CALCULUS REMOVAL USING BASKET
Anesthesia: General | Site: Ureter

## 2015-02-19 MED ORDER — SODIUM CHLORIDE 0.9 % IV SOLN
2.0000 g | INTRAVENOUS | Status: DC | PRN
  Administered 2015-02-19: 2 g via INTRAVENOUS

## 2015-02-19 MED ORDER — SODIUM CHLORIDE 0.9 % IR SOLN
Status: DC | PRN
  Administered 2015-02-19: 6000 mL

## 2015-02-19 MED ORDER — CEFTRIAXONE SODIUM 2 G IJ SOLR
INTRAMUSCULAR | Status: AC
  Filled 2015-02-19: qty 2

## 2015-02-19 MED ORDER — PHENYLEPHRINE HCL 10 MG/ML IJ SOLN
INTRAMUSCULAR | Status: DC | PRN
  Administered 2015-02-19: 40 ug via INTRAVENOUS
  Administered 2015-02-19: 80 ug via INTRAVENOUS

## 2015-02-19 MED ORDER — HYDROMORPHONE HCL 1 MG/ML PO LIQD
1.0000 mg | Freq: Four times a day (QID) | ORAL | Status: DC | PRN
  Filled 2015-02-19: qty 1

## 2015-02-19 MED ORDER — SODIUM CHLORIDE 0.9 % IJ SOLN
3.0000 mL | Freq: Two times a day (BID) | INTRAMUSCULAR | Status: DC
  Filled 2015-02-19: qty 3

## 2015-02-19 MED ORDER — DEXAMETHASONE SODIUM PHOSPHATE 4 MG/ML IJ SOLN
INTRAMUSCULAR | Status: DC | PRN
  Administered 2015-02-19: 10 mg via INTRAVENOUS

## 2015-02-19 MED ORDER — LIDOCAINE HCL (CARDIAC) 20 MG/ML IV SOLN
INTRAVENOUS | Status: DC | PRN
  Administered 2015-02-19: 70 mg via INTRAVENOUS

## 2015-02-19 MED ORDER — ACETAMINOPHEN 10 MG/ML IV SOLN
INTRAVENOUS | Status: DC | PRN
  Administered 2015-02-19: 1000 mg via INTRAVENOUS

## 2015-02-19 MED ORDER — LACTATED RINGERS IV SOLN
INTRAVENOUS | Status: DC
  Administered 2015-02-19: 08:00:00 via INTRAVENOUS
  Filled 2015-02-19: qty 1000

## 2015-02-19 MED ORDER — CIPROFLOXACIN IN D5W 400 MG/200ML IV SOLN
400.0000 mg | INTRAVENOUS | Status: AC
  Administered 2015-02-19: 400 mg via INTRAVENOUS
  Filled 2015-02-19: qty 200

## 2015-02-19 MED ORDER — OXYCODONE HCL 5 MG PO TABS
5.0000 mg | ORAL_TABLET | ORAL | Status: DC | PRN
  Filled 2015-02-19: qty 2

## 2015-02-19 MED ORDER — FENTANYL CITRATE 0.05 MG/ML IJ SOLN
INTRAMUSCULAR | Status: AC
  Filled 2015-02-19: qty 4

## 2015-02-19 MED ORDER — FENTANYL CITRATE 0.05 MG/ML IJ SOLN
25.0000 ug | INTRAMUSCULAR | Status: DC | PRN
  Filled 2015-02-19: qty 1

## 2015-02-19 MED ORDER — ONDANSETRON HCL 4 MG/2ML IJ SOLN
INTRAMUSCULAR | Status: DC | PRN
  Administered 2015-02-19: 4 mg via INTRAVENOUS

## 2015-02-19 MED ORDER — ACETAMINOPHEN 650 MG RE SUPP
650.0000 mg | RECTAL | Status: DC | PRN
  Filled 2015-02-19: qty 1

## 2015-02-19 MED ORDER — HYDROCODONE-ACETAMINOPHEN 5-325 MG PO TABS: 1.0000 | ORAL_TABLET | Freq: Four times a day (QID) | ORAL | Status: DC | PRN

## 2015-02-19 MED ORDER — EPHEDRINE SULFATE 50 MG/ML IJ SOLN
INTRAMUSCULAR | Status: DC | PRN
  Administered 2015-02-19: 10 mg via INTRAVENOUS

## 2015-02-19 MED ORDER — SODIUM CHLORIDE 0.9 % IJ SOLN
3.0000 mL | INTRAMUSCULAR | Status: DC | PRN
  Filled 2015-02-19: qty 3

## 2015-02-19 MED ORDER — CIPROFLOXACIN IN D5W 400 MG/200ML IV SOLN
INTRAVENOUS | Status: AC
  Filled 2015-02-19: qty 200

## 2015-02-19 MED ORDER — STERILE WATER FOR IRRIGATION IR SOLN
Status: DC | PRN
  Administered 2015-02-19: 500 mL

## 2015-02-19 MED ORDER — ACETAMINOPHEN 325 MG PO TABS
650.0000 mg | ORAL_TABLET | ORAL | Status: DC | PRN
  Filled 2015-02-19: qty 2

## 2015-02-19 MED ORDER — FENTANYL CITRATE 0.05 MG/ML IJ SOLN
INTRAMUSCULAR | Status: DC | PRN
  Administered 2015-02-19: 50 ug via INTRAVENOUS

## 2015-02-19 MED ORDER — SODIUM CHLORIDE 0.9 % IV SOLN
250.0000 mL | INTRAVENOUS | Status: DC | PRN
  Filled 2015-02-19: qty 250

## 2015-02-19 MED ORDER — LACTATED RINGERS IV SOLN
INTRAVENOUS | Status: DC
  Filled 2015-02-19: qty 1000

## 2015-02-19 MED ORDER — PROPOFOL 10 MG/ML IV BOLUS
INTRAVENOUS | Status: DC | PRN
  Administered 2015-02-19: 150 mg via INTRAVENOUS
  Administered 2015-02-19: 50 mg via INTRAVENOUS

## 2015-02-19 SURGICAL SUPPLY — 39 items
BAG DRAIN URO-CYSTO SKYTR STRL (DRAIN) ×5 IMPLANT
BAG DRN UROCATH (DRAIN) ×3
BASKET LASER NITINOL 1.9FR (BASKET) IMPLANT
BASKET ZERO TIP NITINOL 2.4FR (BASKET) ×3 IMPLANT
BSKT STON RTRVL 120 1.9FR (BASKET)
BSKT STON RTRVL ZERO TP 2.4FR (BASKET) ×3
CANISTER SUCT LVC 12 LTR MEDI- (MISCELLANEOUS) ×3 IMPLANT
CATH URET 5FR 28IN CONE TIP (BALLOONS)
CATH URET 5FR 28IN OPEN ENDED (CATHETERS) ×3 IMPLANT
CATH URET 5FR 70CM CONE TIP (BALLOONS) IMPLANT
CLOTH BEACON ORANGE TIMEOUT ST (SAFETY) ×5 IMPLANT
DRESSING TELFA 8X3 (GAUZE/BANDAGES/DRESSINGS) ×5 IMPLANT
ELECT REM PT RETURN 9FT ADLT (ELECTROSURGICAL)
ELECTRODE REM PT RTRN 9FT ADLT (ELECTROSURGICAL) IMPLANT
FIBER LASER FLEXIVA 1000 (UROLOGICAL SUPPLIES) IMPLANT
FIBER LASER FLEXIVA 200 (UROLOGICAL SUPPLIES) IMPLANT
FIBER LASER FLEXIVA 365 (UROLOGICAL SUPPLIES) IMPLANT
FIBER LASER FLEXIVA 550 (UROLOGICAL SUPPLIES) IMPLANT
GLOVE BIO SURGEON STRL SZ 6.5 (GLOVE) ×2 IMPLANT
GLOVE BIO SURGEONS STRL SZ 6.5 (GLOVE) ×1
GLOVE INDICATOR 6.5 STRL GRN (GLOVE) ×6 IMPLANT
GLOVE SURG SS PI 8.0 STRL IVOR (GLOVE) ×8 IMPLANT
GOWN STRL REUS W/ TWL LRG LVL3 (GOWN DISPOSABLE) ×1 IMPLANT
GOWN STRL REUS W/ TWL XL LVL3 (GOWN DISPOSABLE) ×1 IMPLANT
GOWN STRL REUS W/TWL LRG LVL3 (GOWN DISPOSABLE) ×5
GOWN STRL REUS W/TWL XL LVL3 (GOWN DISPOSABLE) ×5
GUIDEWIRE 0.038 PTFE COATED (WIRE) IMPLANT
GUIDEWIRE ANG ZIPWIRE 038X150 (WIRE) IMPLANT
GUIDEWIRE STR DUAL SENSOR (WIRE) ×5 IMPLANT
IV NS IRRIG 3000ML ARTHROMATIC (IV SOLUTION) ×8 IMPLANT
KIT BALLIN UROMAX 15FX10 (LABEL) IMPLANT
KIT BALLN UROMAX 15FX4 (MISCELLANEOUS) IMPLANT
KIT BALLN UROMAX 26 75X4 (MISCELLANEOUS)
PACK CYSTO (CUSTOM PROCEDURE TRAY) ×5 IMPLANT
SET HIGH PRES BAL DIL (LABEL)
SHEATH ACCESS URETERAL 38CM (SHEATH) IMPLANT
SURGILUBE 2OZ TUBE FLIPTOP (MISCELLANEOUS) ×5 IMPLANT
TOWEL OR 17X24 6PK STRL BLUE (TOWEL DISPOSABLE) ×5 IMPLANT
UNDERPAD 30X30 INCONTINENT (UNDERPADS AND DIAPERS) ×2 IMPLANT

## 2015-02-19 NOTE — Brief Op Note (Signed)
02/19/2015  9:20 AM  PATIENT:  Russell [name]  69 y.o. male  PRE-OPERATIVE DIAGNOSIS:  LEFT DISTAL STONE/BLADDER STONE/INCREASED PSA  POST-OPERATIVE DIAGNOSIS:  LEFT DISTAL STONE/BLADDER STONE/INCREASED PSA  PROCEDURE:  Procedure(s): CYSTO WITH BLADDER STONE REMOVAL LEFT URETEROSCOPY STONE EXTRACTION (Left) BIOPSY TRANSRECTAL ULTRASONIC PROSTATE (TUBP) (N/A)  SURGEON:  Surgeon(s) and Role:    * Irine Seal, MD - Primary  PHYSICIAN ASSISTANT:   ASSISTANTS: none   ANESTHESIA:   none  EBL:  Total I/O In: 250 [I.V.:250] Out: -   BLOOD ADMINISTERED:none  DRAINS: none   LOCAL MEDICATIONS USED:  NONE  SPECIMEN:  Source of Specimen:  12 core prostate biopsy, bladder stone and left ureteral stone  DISPOSITION OF SPECIMEN:  prostate to path and stones to patient to bring to office  COUNTS:  YES  TOURNIQUET:  * No tourniquets in log *  DICTATION: .Other Dictation: Dictation Number [id number]  PLAN OF CARE: Discharge to home after PACU  PATIENT DISPOSITION:  PACU - hemodynamically stable.   Delay start of Pharmacological VTE agent (>24hrs) due to surgical blood loss or risk of bleeding: not applicable

## 2015-02-19 NOTE — Discharge Instructions (Addendum)
CYSTOSCOPY HOME CARE INSTRUCTIONS  Activity: Rest for the remainder of the day.  Do not drive or operate equipment today.  You may resume normal activities in one to two days as instructed by your physician.   Meals: Drink plenty of liquids and eat light foods such as gelatin or soup this evening.  You may return to a normal meal plan tomorrow.  Return to Work: You may return to work in one to two days or as instructed by your physician.  Special Instructions / Symptoms: Call your physician if any of these symptoms occur:   -persistent or heavy bleeding  -bleeding which continues after first few urination  -large blood clots that are difficult to pass  -urine stream diminishes or stops completely  -fever equal to or higher than 101 degrees Farenheit.  -cloudy urine with a strong, foul odor  -severe pain  Females should always wipe from front to back after elimination.  You may feel some burning pain when you urinate.  This should disappear with time.  Applying moist heat to the lower abdomen or a hot tub bath may help relieve the pain. \  Patient Signature:  ________________________________________________________  Nurse's Signature:  ________________________________________________________   Post Anesthesia Home Care Instructions  Activity: Get plenty of rest for the remainder of the day. A responsible adult should stay with you for 24 hours following the procedure.  For the next 24 hours, DO NOT: -Drive a car -Paediatric nurse -Drink alcoholic beverages -Take any medication unless instructed by your physician -Make any legal decisions or sign important papers.  Meals: Start with liquid foods such as gelatin or soup. Progress to regular foods as tolerated. Avoid greasy, spicy, heavy foods. If nausea and/or vomiting occur, drink only clear liquids until the nausea and/or vomiting subsides. Call your physician if vomiting continues.  Special Instructions/Symptoms: Your  throat may feel dry or sore from the anesthesia or the breathing tube placed in your throat during surgery. If this causes discomfort, gargle with warm salt water. The discomfort should disappear within 24 hours. [facility] [phone] Post Ureteroscopy With or Without Stent Instructions  Definitions:  Ureter: The duct that transports urine from the kidney to the bladder. Stent:   A plastic hollow tube that is placed into the ureter, from the kidney to the                 bladder to prevent the ureter from swelling shut.  GENERAL INSTRUCTIONS:  Despite the fact that no skin incisions were used, the area around the ureter and bladder is raw and irritated. The stent is a foreign body which will further irritate the bladder wall. This irritation is manifested by increased frequency of urination, both day and night, and by an increase in the urge to urinate. In some, the urge to urinate is present almost always. Sometimes the urge is strong enough that you may not be able to stop yourself from urinating. The only real cure is to remove the stent and then give time for the bladder wall to heal which can't be done until the danger of the ureter swelling shut has passed, which varies.  You may see some blood in your urine while the stent is in place and a few days afterwards. Do not be alarmed, even if the urine was clear for a while. Get off your feet and drink lots of fluids until clearing occurs. If you start to pass clots or don't improve, call us.  DIET:  You may return to your normal diet immediately. Because of the raw surface of your bladder, alcohol, spicy foods, acid type foods and drinks with caffeine may cause irritation or frequency and should be used in moderation. To keep your urine flowing freely and to avoid constipation, drink plenty of fluids during the day ( 8-10 glasses ). Tip: Avoid cranberry juice because it is very acidic.  ACTIVITY: Your physical activity  doesn't need to be restricted. However, if you are very active, you may see some blood in your urine. We suggest that you reduce your activity under these circumstances until the bleeding has stopped.  BOWELS: It is important to keep your bowels regular during the postoperative period. Straining with bowel movements can cause bleeding. A bowel movement every other day is reasonable. Use a mild laxative if needed, such as Milk of Magnesia 2-3 tablespoons, or 2 Dulcolax tablets. Call if you continue to have problems. If you have been taking narcotics for pain, before, during or after your surgery, you may be constipated. Take a laxative if necessary.   MEDICATION: You should resume your pre-surgery medications unless told not to. In addition you will often be given an antibiotic to prevent infection. These should be taken as prescribed until the bottles are finished unless you are having an unusual reaction to one of the drugs.  PROBLEMS YOU SHOULD REPORT TO [place]:  Fevers over 100.5 Fahrenheit.  Heavy bleeding, or clots ( See above notes about blood in urine ).  Inability to urinate.  Drug reactions ( hives, rash, nausea, vomiting, diarrhea ).  Severe burning or pain with urination that is not improving.  FOLLOW-UP: You will need a follow-up appointment to monitor your progress. Call for this appointment at the number listed above. Usually the first appointment will be about three to fourteen days after your surgery.

## 2015-02-19 NOTE — Anesthesia Postprocedure Evaluation (Signed)
  Anesthesia Post-op Note  Patient: Russell [name]  Procedure(s) Performed: Procedure(s) (LRB): CYSTO WITH BLADDER STONE REMOVAL/LEFT URETEROSCOPY STONE EXTRACTION  (Left) BIOPSY TRANSRECTAL ULTRASONIC PROSTATE (TUBP) (N/A)  Patient Location: PACU  Anesthesia Type: General  Level of Consciousness: awake and alert   Airway and Oxygen Therapy: Patient Spontanous Breathing  Post-op Pain: mild  Post-op Assessment: Post-op Vital signs reviewed, Patient's Cardiovascular Status Stable, Respiratory Function Stable, Patent Airway and No signs of Nausea or vomiting  Last Vitals:  Filed Vitals:   02/19/15 0957  BP:   Pulse: 68  Temp:   Resp: 11    Post-op Vital Signs: stable   Complications: No apparent anesthesia complications

## 2015-02-19 NOTE — Transfer of Care (Signed)
Immediate Anesthesia Transfer of Care Note  Patient: CHARLETON DEYOUNG  Procedure(s) Performed: Procedure(s): CYSTO WITH BLADDER STONE REMOVAL/LEFT URETEROSCOPY STONE EXTRACTION WITH POSSIBLE STENT (Left) BIOPSY TRANSRECTAL ULTRASONIC PROSTATE (TUBP) (N/A) HOLMIUM LASER APPLICATION (N/A)  Patient Location: PACU  Anesthesia Type:General  Level of Consciousness: awake and oriented  Airway & Oxygen Therapy: Patient Spontanous Breathing and Patient connected to nasal cannula oxygen  Post-op Assessment: Report given to RN  Post vital signs: Reviewed and stable  Last Vitals:  Filed Vitals:   02/19/15 0810  BP: 150/72  Pulse: 83  Temp: 36.3 C  Resp: 16    Complications: No apparent anesthesia complications

## 2015-02-19 NOTE — Interval H&P Note (Signed)
History and Physical Interval Note:  02/19/2015 8:22 AM  Russell [name]  has presented today for surgery, with the diagnosis of LEFT DISTAL STONE/BLADDER STONE/INCREASED PSA  The various methods of treatment have been discussed with the patient and family. After consideration of risks, benefits and other options for treatment, the patient has consented to  Procedure(s): CYSTO WITH BLADDER STONE REMOVAL/LEFT URETEROSCOPY STONE EXTRACTION WITH POSSIBLE STENT (Left) BIOPSY TRANSRECTAL ULTRASONIC PROSTATE (TUBP) (N/A) HOLMIUM LASER APPLICATION (N/A) as a surgical intervention .  The patient's history has been reviewed, patient examined, no change in status, stable for surgery.  I have reviewed the patient's chart and labs.  Questions were answered to the patient's satisfaction.     Dacia Capers J

## 2015-02-19 NOTE — Anesthesia Procedure Notes (Signed)
Procedure Name: LMA Insertion Date/Time: 02/19/2015 8:49 AM Performed by: Bethena Roys T Pre-anesthesia Checklist: Patient identified, Emergency Drugs available, Suction available and Patient being monitored Patient Re-evaluated:Patient Re-evaluated prior to inductionOxygen Delivery Method: Circle System Utilized Preoxygenation: Pre-oxygenation with 100% oxygen Intubation Type: IV induction Ventilation: Mask ventilation without difficulty LMA: LMA inserted LMA Size: 5.0 Number of attempts: 1 Airway Equipment and Method: bite block Placement Confirmation: positive ETCO2 Dental Injury: Teeth and Oropharynx as per pre-operative assessment

## 2015-02-20 ENCOUNTER — Encounter (HOSPITAL_COMMUNITY)
Admission: RE | Admit: 2015-02-20 | Discharge: 2015-02-20 | Disposition: A | Discharge status: Home / Self Care | Payer: Medicare Other | Source: Ambulatory Visit | Attending: Thoracic Surgery (Cardiothoracic Vascular Surgery) | Admitting: Thoracic Surgery (Cardiothoracic Vascular Surgery)

## 2015-02-20 ENCOUNTER — Encounter (HOSPITAL_COMMUNITY): Payer: Self-pay

## 2015-02-20 ENCOUNTER — Encounter (HOSPITAL_BASED_OUTPATIENT_CLINIC_OR_DEPARTMENT_OTHER): Payer: Self-pay | Admitting: Urology

## 2015-02-20 VITALS — BP 142/52 | HR 71 | Temp 98.7°F | Resp 16 | Ht 72.0 in | Wt 193.3 lb

## 2015-02-20 DIAGNOSIS — Z0183 Encounter for blood typing: Secondary | ICD-10-CM | POA: Insufficient documentation

## 2015-02-20 DIAGNOSIS — R918 Other nonspecific abnormal finding of lung field: Secondary | ICD-10-CM | POA: Insufficient documentation

## 2015-02-20 DIAGNOSIS — J984 Other disorders of lung: Secondary | ICD-10-CM

## 2015-02-20 DIAGNOSIS — Z01818 Encounter for other preprocedural examination: Secondary | ICD-10-CM | POA: Insufficient documentation

## 2015-02-20 DIAGNOSIS — Z01812 Encounter for preprocedural laboratory examination: Secondary | ICD-10-CM | POA: Insufficient documentation

## 2015-02-20 LAB — CBC
HEMATOCRIT: 39.5 % (ref 39.0–52.0)
Hemoglobin: 13.6 g/dL (ref 13.0–17.0)
MCH: 30.6 pg (ref 26.0–34.0)
MCHC: 34.4 g/dL (ref 30.0–36.0)
MCV: 89 fL (ref 78.0–100.0)
Platelets: 185 10*3/uL (ref 150–400)
RBC: 4.44 MIL/uL (ref 4.22–5.81)
RDW: 12.7 % (ref 11.5–15.5)
WBC: 10.6 10*3/uL — ABNORMAL HIGH (ref 4.0–10.5)

## 2015-02-20 LAB — BLOOD GAS, ARTERIAL
ACID-BASE EXCESS: 0.8 mmol/L (ref 0.0–2.0)
Bicarbonate: 24.9 mEq/L — ABNORMAL HIGH (ref 20.0–24.0)
Drawn by: 206361
FIO2: 0.21 %
O2 SAT: 93.3 %
PCO2 ART: 39.7 mmHg (ref 35.0–45.0)
Patient temperature: 98.6
TCO2: 26.1 mmol/L (ref 0–100)
pH, Arterial: 7.413 (ref 7.350–7.450)
pO2, Arterial: 68.7 mmHg — ABNORMAL LOW (ref 80.0–100.0)

## 2015-02-20 LAB — COMPREHENSIVE METABOLIC PANEL
ALK PHOS: 41 U/L (ref 39–117)
ALT: 30 U/L (ref 0–53)
ANION GAP: 8 (ref 5–15)
AST: 25 U/L (ref 0–37)
Albumin: 4 g/dL (ref 3.5–5.2)
BUN: 20 mg/dL (ref 6–23)
CO2: 25 mmol/L (ref 19–32)
CREATININE: 1.12 mg/dL (ref 0.50–1.35)
Calcium: 9 mg/dL (ref 8.4–10.5)
Chloride: 108 mmol/L (ref 96–112)
GFR calc Af Amer: 75 mL/min — ABNORMAL LOW (ref >90)
GFR calc non Af Amer: 65 mL/min — ABNORMAL LOW (ref >90)
Glucose, Bld: 105 mg/dL — ABNORMAL HIGH (ref 70–99)
POTASSIUM: 4.3 mmol/L (ref 3.5–5.1)
Sodium: 141 mmol/L (ref 135–145)
Total Bilirubin: 0.7 mg/dL (ref 0.3–1.2)
Total Protein: 6.3 g/dL (ref 6.0–8.3)

## 2015-02-20 LAB — URINALYSIS, ROUTINE W REFLEX MICROSCOPIC
GLUCOSE, UA: NEGATIVE mg/dL
KETONES UR: 15 mg/dL — AB
Nitrite: NEGATIVE
Protein, ur: 30 mg/dL — AB
Specific Gravity, Urine: 1.024 (ref 1.005–1.030)
Urobilinogen, UA: 1 mg/dL (ref 0.0–1.0)
pH: 5.5 (ref 5.0–8.0)

## 2015-02-20 LAB — PROTIME-INR
INR: 1.11 (ref 0.00–1.49)
Prothrombin Time: 14.4 seconds (ref 11.6–15.2)

## 2015-02-20 LAB — ABO/RH: ABO/RH(D): A POS

## 2015-02-20 LAB — URINE MICROSCOPIC-ADD ON

## 2015-02-20 LAB — APTT: aPTT: 31 seconds (ref 24–37)

## 2015-02-20 LAB — SURGICAL PCR SCREEN
MRSA, PCR: NEGATIVE
Staphylococcus aureus: NEGATIVE

## 2015-02-20 NOTE — Op Note (Signed)
NAME:  ANNIE, ROSEBOOM            ACCOUNT NO.:  [id number]  MEDICAL RECORD NO.:  [id number]  LOCATION:                                 FACILITY:  PHYSICIAN:  Marshall Cork. Jeffie Pollock, M.D.    DATE OF BIRTH:  [birth year]  DATE OF PROCEDURE:  02/19/2015 DATE OF DISCHARGE:  02/19/2015                              OPERATIVE REPORT   PROCEDURE: 1. Transrectal ultrasound of the prostate. 2. Transrectal ultrasound-guided prostate biopsy. 3. Cystoscopy with removal of bladder stone. 4. Left ureteroscopic stone extraction.  PREOPERATIVE DIAGNOSES: 1. Elevated PSA. 2. Bladder stone. 3. Left distal ureteral stone.  POSTOPERATIVE DIAGNOSES: 1. Elevated PSA. 2. Bladder stone. 3. Left distal ureteral stone.  SURGEON:  Marshall Cork. Jeffie Pollock, M.D.  ANESTHESIA:  General.  SPECIMEN: 1. A 12-core prostate biopsy, sent to Pathology. 2. Bladder stone and ureteral stones given to patient.  BLOOD LOSS:  Minimal.  DRAINS:  None.  COMPLICATIONS:  None.  INDICATIONS:  Mr. Blanda is a 69 year old white male with a history of an elevated PSA and microhematuria and he was found to have a bladder stone and a left distal ureteral stone on CT scan evaluation.  It was felt that prostate biopsy and removal of the bladder and ureteral stones were indicated.  He had been on Levaquin preoperatively.  FINDINGS AND PROCEDURE:  He was taken to the operating room, where he was given IV Cipro and Rocephin.  A general anesthetic was induced.  He was placed in the lithotomy position.  The 10 megahertz transrectal ultrasound probe was assembled and inserted and scanning was performed.  This revealed normal-appearing seminal vesicles.  The prostate ultrasound demonstrated an asymmetric peripheral zone with normal echo texture on the right.  On the left, there were some thickening of the peripheral zone with a modeled hypoechogenicity that extended from the base into the mid prostate.  The transitional zone was somewhat  asymmetric as well with the bulk of the tissue noted on the right side.  It had a very gated echotexture that was normal. There were calcifications at the junction of the peripheral and transitional zones.  The prostatic volume was 51.48 mL with a length of 4.72, height of 3.77 and a width of 5.53 cm.  After completion of the diagnostic scan, a 12-core biopsy was obtained in the standard configuration with lateral and medial cores taken on the right and left sides from the base, mid and apical prostate.  There were no complications and minimal bleeding during the biopsy portion of the procedure.  After completion of the biopsy, the patient was prepped with Betadine solution and draped in the usual sterile fashion.  The 22-French cystoscope with 30-degree lens was passed.  Examination revealed a normal urethra.  The external sphincter was intact.  The prostatic urethra was approximately 3-4 cm in length.  The lateral lobes were not terribly large, but there was a middle lobe that cause some obstruction. Examination of the bladder revealed mild trabeculation.  There was an approximately 10 mm stone in the base of the bladder that had been previously noted on CT and office cystoscopy, as well as some tiny less than 1 mm stones.  The stone was  grasped with a basket and removed intact without difficulty.  There were no other bladder wall abnormalities.  The right ureteral orifice was unremarkable.  The left ureteral orifice was slightly dilated consistent with this distal stone.  A guidewire was then passed up the left ureteral orifice to the kidney and the cystoscope was removed.  A 12-French introducer sheath inner core was then passed over the wire to the level of the stone without difficulty.  The 6.5-French semirigid ureteroscope was then passed alongside the wire.  The stone was visualized.  It was grasped with a Nitinol basket and surprisingly despite its size approximately  11 mm in length by about 8 mm in width, I was able to remove it intact without injury to the ureteral mucosa.  After the stone was removed, inspection revealed no significant mucosal tears or other abnormalities and it was not felt the stent was indicated.  The cystoscope was reinserted over the wire.  The wire was removed and the bladder was drained.  The patient was then taken down from lithotomy position.  His anesthetic was reversed.  He was moved to the recovery room in stable condition.  There were no complications.     Marshall Cork. Jeffie Pollock, M.D.     JJW/MEDQ  D:  02/19/2015  T:  02/20/2015  Job:  [id number]

## 2015-02-20 NOTE — Pre-Procedure Instructions (Signed)
AVANTAE BITHER  02/20/2015   Your procedure is scheduled on:  02/23/15  Report to [facility] Admitting at 830 AM.  Call this number if you have problems the morning of surgery: [phone]   Remember:   Do not eat food or drink liquids after midnight.   Take these medicines the morning of surgery with A SIP OF WATER: hydrocodone,wellbutrin   Do not wear jewelry, make-up or nail polish.  Do not wear lotions, powders, or perfumes. You may wear deodorant.  Do not shave 48 hours prior to surgery. Men may shave face and neck.  Do not bring valuables to the hospital.  [facility] is not responsible                  for any belongings or valuables.               Contacts, dentures or bridgework may not be worn into surgery.  Leave suitcase in the car. After surgery it may be brought to your room.  For patients admitted to the hospital, discharge time is determined by your                treatment team.               Patients discharged the day of surgery will not be allowed to drive  home.  Name and phone number of your driver: family  Special Instructions: Shower using CHG 2 nights before surgery and the night before surgery.  If you shower the day of surgery use CHG.  Use special wash - you have one bottle of CHG for all showers.  You should use approximately 1/3 of the bottle for each shower.   Please read over the following fact sheets that you were given: Pain Booklet, Coughing and Deep Breathing, Blood Transfusion Information, MRSA Information and Surgical Site Infection Prevention

## 2015-02-22 MED ORDER — DEXTROSE 5 % IV SOLN
1.5000 g | INTRAVENOUS | Status: AC
  Administered 2015-02-23: 1.5 g via INTRAVENOUS
  Filled 2015-02-22: qty 1.5

## 2015-02-23 ENCOUNTER — Encounter (HOSPITAL_COMMUNITY): Payer: Self-pay

## 2015-02-23 ENCOUNTER — Inpatient Hospital Stay (HOSPITAL_COMMUNITY): Payer: Medicare Other | Admitting: Anesthesiology

## 2015-02-23 ENCOUNTER — Encounter (HOSPITAL_COMMUNITY)
Admission: RE | Disposition: A | Discharge status: Home / Self Care | Payer: Self-pay | Source: Ambulatory Visit | Attending: Thoracic Surgery (Cardiothoracic Vascular Surgery)

## 2015-02-23 ENCOUNTER — Inpatient Hospital Stay (HOSPITAL_COMMUNITY): Payer: Medicare Other

## 2015-02-23 ENCOUNTER — Inpatient Hospital Stay (HOSPITAL_COMMUNITY)
Admission: RE | Admit: 2015-02-23 | Discharge: 2015-03-01 | DRG: 164 | Disposition: A | Discharge status: Home / Self Care | Payer: Medicare Other | Source: Ambulatory Visit | Attending: Thoracic Surgery (Cardiothoracic Vascular Surgery) | Admitting: Thoracic Surgery (Cardiothoracic Vascular Surgery)

## 2015-02-23 DIAGNOSIS — I97618 Postprocedural hemorrhage and hematoma of a circulatory system organ or structure following other circulatory system procedure: Secondary | ICD-10-CM

## 2015-02-23 DIAGNOSIS — Z87891 Personal history of nicotine dependence: Secondary | ICD-10-CM | POA: Diagnosis not present

## 2015-02-23 DIAGNOSIS — N400 Enlarged prostate without lower urinary tract symptoms: Secondary | ICD-10-CM | POA: Diagnosis present

## 2015-02-23 DIAGNOSIS — I719 Aortic aneurysm of unspecified site, without rupture: Secondary | ICD-10-CM | POA: Diagnosis present

## 2015-02-23 DIAGNOSIS — E785 Hyperlipidemia, unspecified: Secondary | ICD-10-CM | POA: Diagnosis present

## 2015-02-23 DIAGNOSIS — I2510 Atherosclerotic heart disease of native coronary artery without angina pectoris: Secondary | ICD-10-CM | POA: Diagnosis present

## 2015-02-23 DIAGNOSIS — J984 Other disorders of lung: Secondary | ICD-10-CM

## 2015-02-23 DIAGNOSIS — K5900 Constipation, unspecified: Secondary | ICD-10-CM | POA: Diagnosis not present

## 2015-02-23 DIAGNOSIS — I480 Paroxysmal atrial fibrillation: Secondary | ICD-10-CM | POA: Diagnosis not present

## 2015-02-23 DIAGNOSIS — J939 Pneumothorax, unspecified: Secondary | ICD-10-CM

## 2015-02-23 DIAGNOSIS — N200 Calculus of kidney: Secondary | ICD-10-CM | POA: Diagnosis present

## 2015-02-23 DIAGNOSIS — D62 Acute posthemorrhagic anemia: Secondary | ICD-10-CM | POA: Diagnosis not present

## 2015-02-23 DIAGNOSIS — E876 Hypokalemia: Secondary | ICD-10-CM | POA: Diagnosis not present

## 2015-02-23 DIAGNOSIS — I129 Hypertensive chronic kidney disease with stage 1 through stage 4 chronic kidney disease, or unspecified chronic kidney disease: Secondary | ICD-10-CM | POA: Diagnosis present

## 2015-02-23 DIAGNOSIS — E872 Acidosis: Secondary | ICD-10-CM | POA: Diagnosis present

## 2015-02-23 DIAGNOSIS — J95830 Postprocedural hemorrhage and hematoma of a respiratory system organ or structure following a respiratory system procedure: Secondary | ICD-10-CM | POA: Diagnosis not present

## 2015-02-23 DIAGNOSIS — F329 Major depressive disorder, single episode, unspecified: Secondary | ICD-10-CM | POA: Diagnosis present

## 2015-02-23 DIAGNOSIS — C3432 Malignant neoplasm of lower lobe, left bronchus or lung: Principal | ICD-10-CM | POA: Diagnosis present

## 2015-02-23 DIAGNOSIS — Z7982 Long term (current) use of aspirin: Secondary | ICD-10-CM

## 2015-02-23 DIAGNOSIS — R918 Other nonspecific abnormal finding of lung field: Secondary | ICD-10-CM

## 2015-02-23 DIAGNOSIS — I4891 Unspecified atrial fibrillation: Secondary | ICD-10-CM

## 2015-02-23 DIAGNOSIS — I714 Abdominal aortic aneurysm, without rupture, unspecified: Secondary | ICD-10-CM | POA: Diagnosis present

## 2015-02-23 DIAGNOSIS — Y838 Other surgical procedures as the cause of abnormal reaction of the patient, or of later complication, without mention of misadventure at the time of the procedure: Secondary | ICD-10-CM | POA: Diagnosis not present

## 2015-02-23 DIAGNOSIS — C61 Malignant neoplasm of prostate: Secondary | ICD-10-CM | POA: Diagnosis present

## 2015-02-23 DIAGNOSIS — J942 Hemothorax: Secondary | ICD-10-CM | POA: Diagnosis not present

## 2015-02-23 DIAGNOSIS — Z902 Acquired absence of lung [part of]: Secondary | ICD-10-CM

## 2015-02-23 DIAGNOSIS — N189 Chronic kidney disease, unspecified: Secondary | ICD-10-CM | POA: Diagnosis present

## 2015-02-23 DIAGNOSIS — J9382 Other air leak: Secondary | ICD-10-CM | POA: Diagnosis not present

## 2015-02-23 HISTORY — PX: LOBECTOMY: SHX5089

## 2015-02-23 HISTORY — PX: VIDEO ASSISTED THORACOSCOPY (VATS)/WEDGE RESECTION: SHX6174

## 2015-02-23 HISTORY — PX: VIDEO ASSISTED THORACOSCOPY (VATS)/ LOBECTOMY: SHX6169

## 2015-02-23 HISTORY — PX: LEAD REMOVAL: SHX5944

## 2015-02-23 LAB — GLUCOSE, CAPILLARY: GLUCOSE-CAPILLARY: 150 mg/dL — AB (ref 70–99)

## 2015-02-23 LAB — CBC
HCT: 35 % — ABNORMAL LOW (ref 39.0–52.0)
HEMATOCRIT: 29.7 % — AB (ref 39.0–52.0)
HEMOGLOBIN: 12.1 g/dL — AB (ref 13.0–17.0)
Hemoglobin: 10.2 g/dL — ABNORMAL LOW (ref 13.0–17.0)
MCH: 30.9 pg (ref 26.0–34.0)
MCH: 30.7 pg (ref 26.0–34.0)
MCHC: 34.6 g/dL (ref 30.0–36.0)
MCHC: 34.3 g/dL (ref 30.0–36.0)
MCV: 89.3 fL (ref 78.0–100.0)
MCV: 89.5 fL (ref 78.0–100.0)
Platelets: 212 10*3/uL (ref 150–400)
Platelets: 165 10*3/uL (ref 150–400)
RBC: 3.92 MIL/uL — ABNORMAL LOW (ref 4.22–5.81)
RBC: 3.32 MIL/uL — ABNORMAL LOW (ref 4.22–5.81)
RDW: 12.6 % (ref 11.5–15.5)
RDW: 12.5 % (ref 11.5–15.5)
WBC: 19.3 10*3/uL — ABNORMAL HIGH (ref 4.0–10.5)
WBC: 18.5 10*3/uL — ABNORMAL HIGH (ref 4.0–10.5)

## 2015-02-23 LAB — PROTIME-INR
INR: 1.36 (ref 0.00–1.49)
PROTHROMBIN TIME: 16.9 s — AB (ref 11.6–15.2)

## 2015-02-23 LAB — APTT: aPTT: 30 seconds (ref 24–37)

## 2015-02-23 LAB — PREPARE RBC (CROSSMATCH)

## 2015-02-23 SURGERY — VIDEO ASSISTED THORACOSCOPY (VATS)/ LOBECTOMY
Anesthesia: General | Site: Chest | Laterality: Left

## 2015-02-23 SURGERY — VIDEO ASSISTED THORACOSCOPY (VATS)/WEDGE RESECTION
Anesthesia: General | Site: Chest | Laterality: Left

## 2015-02-23 MED ORDER — GLYCOPYRROLATE 0.2 MG/ML IJ SOLN
INTRAMUSCULAR | Status: DC | PRN
  Administered 2015-02-23: 0.6 mg via INTRAVENOUS

## 2015-02-23 MED ORDER — NEOSTIGMINE METHYLSULFATE 10 MG/10ML IV SOLN
INTRAVENOUS | Status: DC | PRN
  Administered 2015-02-23: 3 mg via INTRAVENOUS

## 2015-02-23 MED ORDER — OXYCODONE HCL 5 MG PO TABS: 5.0000 mg | ORAL_TABLET | Freq: Once | ORAL | Status: AC | PRN

## 2015-02-23 MED ORDER — ACETAMINOPHEN 10 MG/ML IV SOLN
INTRAVENOUS | Status: AC
  Filled 2015-02-23: qty 100

## 2015-02-23 MED ORDER — GLYCOPYRROLATE 0.2 MG/ML IJ SOLN
INTRAMUSCULAR | Status: AC
  Filled 2015-02-23: qty 3

## 2015-02-23 MED ORDER — SODIUM CHLORIDE 0.9 % IV SOLN
Freq: Once | INTRAVENOUS | Status: AC
  Administered 2015-02-23: 500 mL via INTRAVENOUS

## 2015-02-23 MED ORDER — FENTANYL CITRATE 0.05 MG/ML IJ SOLN
INTRAMUSCULAR | Status: AC
  Filled 2015-02-23: qty 5

## 2015-02-23 MED ORDER — ARTIFICIAL TEARS OP OINT
TOPICAL_OINTMENT | OPHTHALMIC | Status: AC
  Filled 2015-02-23: qty 3.5

## 2015-02-23 MED ORDER — MEPERIDINE HCL 25 MG/ML IJ SOLN: 6.2500 mg | INTRAMUSCULAR | Status: DC | PRN

## 2015-02-23 MED ORDER — NEOSTIGMINE METHYLSULFATE 10 MG/10ML IV SOLN
INTRAVENOUS | Status: AC
  Filled 2015-02-23: qty 2

## 2015-02-23 MED ORDER — FENTANYL 10 MCG/ML IV SOLN
INTRAVENOUS | Status: DC
  Administered 2015-02-23: via INTRAVENOUS
  Filled 2015-02-23: qty 50

## 2015-02-23 MED ORDER — DEXTROSE 5 % IV SOLN
INTRAVENOUS | Status: AC
  Filled 2015-02-23: qty 1.5

## 2015-02-23 MED ORDER — SODIUM CHLORIDE 0.9 % IJ SOLN: 9.0000 mL | INTRAMUSCULAR | Status: DC | PRN

## 2015-02-23 MED ORDER — DEXTROSE 5 % IV SOLN
1.5000 g | INTRAVENOUS | Status: DC | PRN
  Administered 2015-02-23: 1.5 g via INTRAVENOUS

## 2015-02-23 MED ORDER — HYDROMORPHONE HCL 1 MG/ML IJ SOLN
INTRAMUSCULAR | Status: AC
  Filled 2015-02-23: qty 1

## 2015-02-23 MED ORDER — ONDANSETRON HCL 4 MG/2ML IJ SOLN
INTRAMUSCULAR | Status: AC
  Filled 2015-02-23: qty 2

## 2015-02-23 MED ORDER — SUCCINYLCHOLINE CHLORIDE 20 MG/ML IJ SOLN
INTRAMUSCULAR | Status: DC | PRN
  Administered 2015-02-23: 120 mg via INTRAVENOUS

## 2015-02-23 MED ORDER — PROPOFOL 10 MG/ML IV BOLUS
INTRAVENOUS | Status: AC
  Filled 2015-02-23: qty 20

## 2015-02-23 MED ORDER — DIPHENHYDRAMINE HCL 12.5 MG/5ML PO ELIX
12.5000 mg | ORAL_SOLUTION | Freq: Four times a day (QID) | ORAL | Status: DC | PRN
  Filled 2015-02-23: qty 5

## 2015-02-23 MED ORDER — ROCURONIUM BROMIDE 100 MG/10ML IV SOLN
INTRAVENOUS | Status: DC | PRN
  Administered 2015-02-23: 10 mg via INTRAVENOUS
  Administered 2015-02-23: 40 mg via INTRAVENOUS

## 2015-02-23 MED ORDER — ROCURONIUM BROMIDE 100 MG/10ML IV SOLN
INTRAVENOUS | Status: DC | PRN
  Administered 2015-02-23: 10 mg via INTRAVENOUS
  Administered 2015-02-23: 30 mg via INTRAVENOUS
  Administered 2015-02-23 (×3): 10 mg via INTRAVENOUS

## 2015-02-23 MED ORDER — ONDANSETRON HCL 4 MG/2ML IJ SOLN
INTRAMUSCULAR | Status: DC | PRN
  Administered 2015-02-23: 4 mg via INTRAVENOUS

## 2015-02-23 MED ORDER — NEOSTIGMINE METHYLSULFATE 10 MG/10ML IV SOLN
INTRAVENOUS | Status: AC
  Filled 2015-02-23: qty 1

## 2015-02-23 MED ORDER — FENTANYL CITRATE 0.05 MG/ML IJ SOLN
INTRAMUSCULAR | Status: DC | PRN
  Administered 2015-02-23: 125 ug via INTRAVENOUS
  Administered 2015-02-23: 100 ug via INTRAVENOUS
  Administered 2015-02-23: 25 ug via INTRAVENOUS
  Administered 2015-02-23: 100 ug via INTRAVENOUS
  Administered 2015-02-23: 150 ug via INTRAVENOUS

## 2015-02-23 MED ORDER — SUCCINYLCHOLINE CHLORIDE 20 MG/ML IJ SOLN
INTRAMUSCULAR | Status: AC
Start: 2015-02-23 — End: 2015-02-23
  Filled 2015-02-23: qty 1

## 2015-02-23 MED ORDER — HEMOSTATIC AGENTS (NO CHARGE) OPTIME
TOPICAL | Status: DC | PRN
  Administered 2015-02-23: 1 via TOPICAL

## 2015-02-23 MED ORDER — LIDOCAINE HCL (CARDIAC) 20 MG/ML IV SOLN
INTRAVENOUS | Status: AC
  Filled 2015-02-23: qty 5

## 2015-02-23 MED ORDER — NEOSTIGMINE METHYLSULFATE 10 MG/10ML IV SOLN
INTRAVENOUS | Status: DC | PRN
Start: 2015-02-23 — End: 2015-02-23
  Administered 2015-02-23: 4 mg via INTRAVENOUS

## 2015-02-23 MED ORDER — FENTANYL CITRATE 0.05 MG/ML IJ SOLN
INTRAMUSCULAR | Status: DC | PRN
  Administered 2015-02-23: 50 ug via INTRAVENOUS
  Administered 2015-02-23: 100 ug via INTRAVENOUS

## 2015-02-23 MED ORDER — MIDAZOLAM HCL 2 MG/2ML IJ SOLN
INTRAMUSCULAR | Status: AC
  Administered 2015-02-23: 1 mg
  Filled 2015-02-23: qty 2

## 2015-02-23 MED ORDER — LACTATED RINGERS IV SOLN
INTRAVENOUS | Status: DC | PRN
  Administered 2015-02-23: 12:00:00 via INTRAVENOUS

## 2015-02-23 MED ORDER — NALOXONE HCL 0.4 MG/ML IJ SOLN
INTRAMUSCULAR | Status: AC
  Filled 2015-02-23: qty 1

## 2015-02-23 MED ORDER — FENTANYL CITRATE 0.05 MG/ML IJ SOLN
INTRAMUSCULAR | Status: AC
  Administered 2015-02-23: 100 ug
  Filled 2015-02-23: qty 2

## 2015-02-23 MED ORDER — ARTIFICIAL TEARS OP OINT
TOPICAL_OINTMENT | OPHTHALMIC | Status: DC | PRN
Start: 2015-02-23 — End: 2015-02-23
  Administered 2015-02-23: 1 via OPHTHALMIC

## 2015-02-23 MED ORDER — 0.9 % SODIUM CHLORIDE (POUR BTL) OPTIME
TOPICAL | Status: DC | PRN
  Administered 2015-02-23: 1000 mL

## 2015-02-23 MED ORDER — LACTATED RINGERS IV SOLN
INTRAVENOUS | Status: DC | PRN
  Administered 2015-02-23 (×2): via INTRAVENOUS

## 2015-02-23 MED ORDER — PHENYLEPHRINE HCL 10 MG/ML IJ SOLN
10.0000 mg | INTRAVENOUS | Status: DC | PRN
  Administered 2015-02-23: 40 ug/min via INTRAVENOUS

## 2015-02-23 MED ORDER — HYDROMORPHONE HCL 1 MG/ML IJ SOLN
0.2500 mg | INTRAMUSCULAR | Status: DC | PRN
  Administered 2015-02-23 (×2): 0.5 mg via INTRAVENOUS
  Administered 2015-02-23: 0.25 mg via INTRAVENOUS

## 2015-02-23 MED ORDER — ONDANSETRON HCL 4 MG/2ML IJ SOLN
4.0000 mg | Freq: Four times a day (QID) | INTRAMUSCULAR | Status: DC | PRN
  Filled 2015-02-23: qty 2

## 2015-02-23 MED ORDER — EPHEDRINE SULFATE 50 MG/ML IJ SOLN
INTRAMUSCULAR | Status: DC | PRN
  Administered 2015-02-23: 10 mg via INTRAVENOUS
  Administered 2015-02-23: 5 mg via INTRAVENOUS

## 2015-02-23 MED ORDER — DEXTROSE-NACL 5-0.9 % IV SOLN
INTRAVENOUS | Status: DC
  Administered 2015-02-24 (×2): 125 mL/h via INTRAVENOUS
  Administered 2015-02-25: 04:00:00 via INTRAVENOUS

## 2015-02-23 MED ORDER — PHENYLEPHRINE HCL 10 MG/ML IJ SOLN
10.0000 mg | INTRAMUSCULAR | Status: DC | PRN
  Administered 2015-02-23: 40 ug/min via INTRAVENOUS

## 2015-02-23 MED ORDER — HYDROMORPHONE HCL 1 MG/ML IJ SOLN
0.2500 mg | INTRAMUSCULAR | Status: DC | PRN
  Administered 2015-02-23 (×2): 0.25 mg via INTRAVENOUS
  Administered 2015-02-23: 0.5 mg via INTRAVENOUS

## 2015-02-23 MED ORDER — ONDANSETRON HCL 4 MG/2ML IJ SOLN
4.0000 mg | Freq: Four times a day (QID) | INTRAMUSCULAR | Status: DC | PRN
Start: 2015-02-23 — End: 2015-02-28
  Filled 2015-02-23: qty 2

## 2015-02-23 MED ORDER — MIDAZOLAM HCL 2 MG/2ML IJ SOLN
INTRAMUSCULAR | Status: AC
  Filled 2015-02-23: qty 2

## 2015-02-23 MED ORDER — LIDOCAINE HCL (CARDIAC) 20 MG/ML IV SOLN
INTRAVENOUS | Status: DC | PRN
  Administered 2015-02-23: 80 mg via INTRAVENOUS

## 2015-02-23 MED ORDER — ACETAMINOPHEN 10 MG/ML IV SOLN
INTRAVENOUS | Status: DC | PRN
  Administered 2015-02-23: 1000 mg via INTRAVENOUS

## 2015-02-23 MED ORDER — 0.9 % SODIUM CHLORIDE (POUR BTL) OPTIME
TOPICAL | Status: DC | PRN
  Administered 2015-02-23: 3000 mL

## 2015-02-23 MED ORDER — OXYCODONE HCL 5 MG/5ML PO SOLN: 5.0000 mg | Freq: Once | ORAL | Status: AC | PRN

## 2015-02-23 MED ORDER — DIPHENHYDRAMINE HCL 50 MG/ML IJ SOLN
12.5000 mg | Freq: Four times a day (QID) | INTRAMUSCULAR | Status: DC | PRN
  Filled 2015-02-23: qty 0.25

## 2015-02-23 MED ORDER — ROCURONIUM BROMIDE 50 MG/5ML IV SOLN
INTRAVENOUS | Status: AC
  Filled 2015-02-23: qty 1

## 2015-02-23 MED ORDER — BUPIVACAINE HCL (PF) 0.5 % IJ SOLN
INTRAMUSCULAR | Status: AC
  Filled 2015-02-23: qty 10

## 2015-02-23 MED ORDER — BUPIVACAINE 0.5 % ON-Q PUMP SINGLE CATH 400 ML
400.0000 mL | INJECTION | Status: DC
  Filled 2015-02-23: qty 400

## 2015-02-23 MED ORDER — LACTATED RINGERS IV SOLN
INTRAVENOUS | Status: DC
  Administered 2015-02-23 (×2): via INTRAVENOUS

## 2015-02-23 MED ORDER — NALOXONE HCL 0.4 MG/ML IJ SOLN
0.4000 mg | INTRAMUSCULAR | Status: DC | PRN
  Administered 2015-02-23: 0.08 mg via INTRAVENOUS
  Filled 2015-02-23: qty 1

## 2015-02-23 MED ORDER — FENTANYL CITRATE 0.05 MG/ML IJ SOLN
INTRAMUSCULAR | Status: AC
Start: 2015-02-23 — End: 2015-02-23
  Filled 2015-02-23: qty 5

## 2015-02-23 MED ORDER — LACTATED RINGERS IV SOLN
INTRAVENOUS | Status: DC | PRN
  Administered 2015-02-23: 19:00:00 via INTRAVENOUS

## 2015-02-23 MED ORDER — PROPOFOL 10 MG/ML IV BOLUS
INTRAVENOUS | Status: DC | PRN
  Administered 2015-02-23: 160 mg via INTRAVENOUS

## 2015-02-23 MED ORDER — SODIUM CHLORIDE 0.9 % IV SOLN
INTRAVENOUS | Status: DC | PRN
  Administered 2015-02-23 (×2): via INTRAVENOUS

## 2015-02-23 MED ORDER — DEXTROSE-NACL 5-0.45 % IV SOLN: INTRAVENOUS | Status: DC

## 2015-02-23 MED ORDER — GLYCOPYRROLATE 0.2 MG/ML IJ SOLN
INTRAMUSCULAR | Status: AC
  Filled 2015-02-23: qty 2

## 2015-02-23 MED ORDER — ETOMIDATE 2 MG/ML IV SOLN
INTRAVENOUS | Status: DC | PRN
  Administered 2015-02-23: 16 mg via INTRAVENOUS

## 2015-02-23 MED ORDER — GLYCOPYRROLATE 0.2 MG/ML IJ SOLN
INTRAMUSCULAR | Status: DC | PRN
  Administered 2015-02-23: 0.4 mg via INTRAVENOUS

## 2015-02-23 MED ORDER — PROMETHAZINE HCL 25 MG/ML IJ SOLN: 6.2500 mg | INTRAMUSCULAR | Status: DC | PRN

## 2015-02-23 MED ORDER — NALOXONE HCL 0.4 MG/ML IJ SOLN
0.4000 mg | INTRAMUSCULAR | Status: DC | PRN
  Filled 2015-02-23: qty 1

## 2015-02-23 MED ORDER — PHENYLEPHRINE HCL 10 MG/ML IJ SOLN
INTRAMUSCULAR | Status: DC | PRN
  Administered 2015-02-23: 80 ug via INTRAVENOUS

## 2015-02-23 MED ORDER — BUPIVACAINE HCL (PF) 0.5 % IJ SOLN: INTRAMUSCULAR | Status: DC | PRN

## 2015-02-23 MED ORDER — ALBUMIN HUMAN 5 % IV SOLN
INTRAVENOUS | Status: DC | PRN
  Administered 2015-02-23: 15:00:00 via INTRAVENOUS

## 2015-02-23 MED ORDER — FENTANYL 10 MCG/ML IV SOLN
INTRAVENOUS | Status: AC
  Administered 2015-02-24: 10 ug via INTRAVENOUS
  Administered 2015-02-24: 30 ug via INTRAVENOUS
  Administered 2015-02-24: 10 ug via INTRAVENOUS
  Administered 2015-02-24: 80 ug via INTRAVENOUS
  Administered 2015-02-24: 160 ug via INTRAVENOUS
  Administered 2015-02-24: 60 ug via INTRAVENOUS
  Administered 2015-02-24: 10 ug via INTRAVENOUS
  Administered 2015-02-25: 120 ug via INTRAVENOUS
  Administered 2015-02-25 (×3): 50 ug via INTRAVENOUS
  Administered 2015-02-25: 90 ug via INTRAVENOUS
  Administered 2015-02-25: 100 ug via INTRAVENOUS
  Administered 2015-02-26: 50 ug via INTRAVENOUS
  Administered 2015-02-26: 80 ug via INTRAVENOUS
  Administered 2015-02-26: 90 ug via INTRAVENOUS
  Administered 2015-02-26: 80 ug via INTRAVENOUS
  Administered 2015-02-26: 150 ug via INTRAVENOUS
  Administered 2015-02-27: 02:00:00 via INTRAVENOUS
  Administered 2015-02-27: 80 ug via INTRAVENOUS
  Administered 2015-02-27: 110 ug via INTRAVENOUS
  Administered 2015-02-27: 100 ug via INTRAVENOUS
  Filled 2015-02-23 (×4): qty 50

## 2015-02-23 MED ORDER — OXYCODONE HCL 5 MG PO TABS: 5.0000 mg | ORAL_TABLET | ORAL | Status: DC | PRN

## 2015-02-23 MED ORDER — LABETALOL HCL 5 MG/ML IV SOLN
INTRAVENOUS | Status: AC
  Filled 2015-02-23: qty 4

## 2015-02-23 MED ORDER — ONDANSETRON HCL 4 MG/2ML IJ SOLN: 4.0000 mg | Freq: Once | INTRAMUSCULAR | Status: AC | PRN

## 2015-02-23 SURGICAL SUPPLY — 80 items
APPLIER CLIP 5 13 M/L LIGAMAX5 (MISCELLANEOUS) ×3
APPLIER CLIP ROT 10 11.4 M/L (STAPLE)
APR CLP MED LRG 11.4X10 (STAPLE)
APR CLP MED LRG 5 ANG JAW (MISCELLANEOUS) ×1
BAG SPEC RTRVL LRG 6X4 10 (ENDOMECHANICALS)
CANISTER SUCTION 2500CC (MISCELLANEOUS) ×3 IMPLANT
CATH KIT ON Q 5IN SLV (PAIN MANAGEMENT) IMPLANT
CATH THORACIC 28FR (CATHETERS) ×2 IMPLANT
CATH THORACIC 28FR RT ANG (CATHETERS) IMPLANT
CATH THORACIC 36FR (CATHETERS) IMPLANT
CATH THORACIC 36FR RT ANG (CATHETERS) IMPLANT
CLIP APPLIE 5 13 M/L LIGAMAX5 (MISCELLANEOUS) IMPLANT
CLIP APPLIE ROT 10 11.4 M/L (STAPLE) IMPLANT
CLIP TI MEDIUM 6 (CLIP) ×3 IMPLANT
CONN ST 1/4X3/8  BEN (MISCELLANEOUS) ×2
CONN ST 1/4X3/8 BEN (MISCELLANEOUS) IMPLANT
CONN Y 3/8X3/8X3/8  BEN (MISCELLANEOUS) ×2
CONN Y 3/8X3/8X3/8 BEN (MISCELLANEOUS) IMPLANT
CONT SPEC 4OZ CLIKSEAL STRL BL (MISCELLANEOUS) ×6 IMPLANT
COVER SURGICAL LIGHT HANDLE (MISCELLANEOUS) IMPLANT
DRAIN CHANNEL 28F RND 3/8 FF (WOUND CARE) ×2 IMPLANT
DRAPE LAPAROSCOPIC ABDOMINAL (DRAPES) ×3 IMPLANT
DRAPE WARM FLUID 44X44 (DRAPE) ×3 IMPLANT
ELECT BLADE 6.5 EXT (BLADE) ×3 IMPLANT
ELECT REM PT RETURN 9FT ADLT (ELECTROSURGICAL) ×3
ELECTRODE REM PT RTRN 9FT ADLT (ELECTROSURGICAL) ×1 IMPLANT
GAUZE SPONGE 4X4 12PLY STRL (GAUZE/BANDAGES/DRESSINGS) ×3 IMPLANT
GLOVE SURG SIGNA 7.5 PF LTX (GLOVE) ×6 IMPLANT
GOWN STRL REUS W/ TWL LRG LVL3 (GOWN DISPOSABLE) ×2 IMPLANT
GOWN STRL REUS W/ TWL XL LVL3 (GOWN DISPOSABLE) ×1 IMPLANT
GOWN STRL REUS W/TWL LRG LVL3 (GOWN DISPOSABLE) ×6
GOWN STRL REUS W/TWL XL LVL3 (GOWN DISPOSABLE) ×3
HEMOSTAT SURGICEL 2X14 (HEMOSTASIS) ×4 IMPLANT
KIT BASIN OR (CUSTOM PROCEDURE TRAY) ×3 IMPLANT
KIT ROOM TURNOVER OR (KITS) ×3 IMPLANT
KIT SUCTION CATH 14FR (SUCTIONS) IMPLANT
NS IRRIG 1000ML POUR BTL (IV SOLUTION) ×6 IMPLANT
PACK CHEST (CUSTOM PROCEDURE TRAY) ×3 IMPLANT
PAD ARMBOARD 7.5X6 YLW CONV (MISCELLANEOUS) ×6 IMPLANT
POUCH ENDO CATCH II 15MM (MISCELLANEOUS) IMPLANT
POUCH SPECIMEN RETRIEVAL 10MM (ENDOMECHANICALS) IMPLANT
SCISSORS ENDO CVD 5DCS (MISCELLANEOUS) IMPLANT
SEALANT PROGEL (MISCELLANEOUS) IMPLANT
SEALANT SURG COSEAL 4ML (VASCULAR PRODUCTS) IMPLANT
SEALANT SURG COSEAL 8ML (VASCULAR PRODUCTS) IMPLANT
SOLUTION ANTI FOG 6CC (MISCELLANEOUS) ×3 IMPLANT
SPECIMEN JAR MEDIUM (MISCELLANEOUS) IMPLANT
SPONGE INTESTINAL PEANUT (DISPOSABLE) IMPLANT
SUT PROLENE 4 0 RB 1 (SUTURE)
SUT PROLENE 4-0 RB1 .5 CRCL 36 (SUTURE) IMPLANT
SUT SILK  1 MH (SUTURE) ×4
SUT SILK 1 MH (SUTURE) ×2 IMPLANT
SUT SILK 1 TIES 10X30 (SUTURE) ×3 IMPLANT
SUT SILK 2 0 SH (SUTURE) IMPLANT
SUT SILK 2 0SH CR/8 30 (SUTURE) IMPLANT
SUT SILK 3 0 SH 30 (SUTURE) IMPLANT
SUT SILK 3 0SH CR/8 30 (SUTURE) ×2 IMPLANT
SUT VIC AB 1 CTX 36 (SUTURE) ×6
SUT VIC AB 1 CTX36XBRD ANBCTR (SUTURE) IMPLANT
SUT VIC AB 2-0 CTX 36 (SUTURE) ×4 IMPLANT
SUT VIC AB 2-0 UR6 27 (SUTURE) IMPLANT
SUT VIC AB 3-0 MH 27 (SUTURE) IMPLANT
SUT VIC AB 3-0 SH 27 (SUTURE) ×3
SUT VIC AB 3-0 SH 27X BRD (SUTURE) IMPLANT
SUT VIC AB 3-0 X1 27 (SUTURE) ×5 IMPLANT
SUT VICRYL 2 TP 1 (SUTURE) IMPLANT
SWAB COLLECTION DEVICE MRSA (MISCELLANEOUS) IMPLANT
SYSTEM SAHARA CHEST DRAIN ATS (WOUND CARE) ×3 IMPLANT
TAPE CLOTH 4X10 WHT NS (GAUZE/BANDAGES/DRESSINGS) ×3 IMPLANT
TIP APPLICATOR SPRAY EXTEND 16 (VASCULAR PRODUCTS) IMPLANT
TOWEL OR 17X24 6PK STRL BLUE (TOWEL DISPOSABLE) ×3 IMPLANT
TOWEL OR 17X26 10 PK STRL BLUE (TOWEL DISPOSABLE) ×3 IMPLANT
TRAP SPECIMEN MUCOUS 40CC (MISCELLANEOUS) IMPLANT
TRAY FOLEY CATH 16FRSI W/METER (SET/KITS/TRAYS/PACK) ×3 IMPLANT
TROCAR BLADELESS 5MM (ENDOMECHANICALS) ×2 IMPLANT
TROCAR XCEL BLADELESS 5X75MML (TROCAR) ×3 IMPLANT
TUBE ANAEROBIC SPECIMEN COL (MISCELLANEOUS) IMPLANT
TUNNELER SHEATH ON-Q 11GX8 DSP (PAIN MANAGEMENT) IMPLANT
WATER STERILE IRR 1000ML POUR (IV SOLUTION) ×6 IMPLANT
YANKAUER SUCT BULB TIP NO VENT (SUCTIONS) ×2 IMPLANT

## 2015-02-23 SURGICAL SUPPLY — 97 items
ADH SKN CLS APL DERMABOND .7 (GAUZE/BANDAGES/DRESSINGS)
APL SKNCLS STERI-STRIP NONHPOA (GAUZE/BANDAGES/DRESSINGS) ×1
BAG SPEC RTRVL LRG 6X4 10 (ENDOMECHANICALS)
BENZOIN TINCTURE PRP APPL 2/3 (GAUZE/BANDAGES/DRESSINGS) ×3 IMPLANT
CANISTER SUCTION 2500CC (MISCELLANEOUS) ×6 IMPLANT
CATH KIT ON Q 5IN SLV (PAIN MANAGEMENT) IMPLANT
CATH THORACIC 28FR (CATHETERS) ×2 IMPLANT
CATH THORACIC 36FR (CATHETERS) IMPLANT
CATH THORACIC 36FR RT ANG (CATHETERS) IMPLANT
CLIP TI MEDIUM 6 (CLIP) ×3 IMPLANT
CONN ST 1/4X3/8  BEN (MISCELLANEOUS)
CONN ST 1/4X3/8 BEN (MISCELLANEOUS) IMPLANT
CONN Y 3/8X3/8X3/8  BEN (MISCELLANEOUS)
CONN Y 3/8X3/8X3/8 BEN (MISCELLANEOUS) IMPLANT
CONT SPEC 4OZ CLIKSEAL STRL BL (MISCELLANEOUS) ×26 IMPLANT
COVER SURGICAL LIGHT HANDLE (MISCELLANEOUS) ×3 IMPLANT
DERMABOND ADVANCED (GAUZE/BANDAGES/DRESSINGS)
DERMABOND ADVANCED .7 DNX12 (GAUZE/BANDAGES/DRESSINGS) IMPLANT
DRAIN CHANNEL 28F RND 3/8 FF (WOUND CARE) ×2 IMPLANT
DRAIN CHANNEL 32F RND 10.7 FF (WOUND CARE) IMPLANT
DRAPE LAPAROSCOPIC ABDOMINAL (DRAPES) ×3 IMPLANT
DRAPE WARM FLUID 44X44 (DRAPE) ×3 IMPLANT
ELECT BLADE 6.5 EXT (BLADE) ×3 IMPLANT
ELECT REM PT RETURN 9FT ADLT (ELECTROSURGICAL) ×3
ELECTRODE REM PT RTRN 9FT ADLT (ELECTROSURGICAL) ×1 IMPLANT
GAUZE SPONGE 4X4 12PLY STRL (GAUZE/BANDAGES/DRESSINGS) ×3 IMPLANT
GLOVE BIO SURGEON STRL SZ 6.5 (GLOVE) ×1 IMPLANT
GLOVE BIO SURGEONS STRL SZ 6.5 (GLOVE) ×1
GLOVE BIOGEL PI IND STRL 6 (GLOVE) IMPLANT
GLOVE BIOGEL PI INDICATOR 6 (GLOVE) ×2
GLOVE SURG SIGNA 7.5 PF LTX (GLOVE) ×6 IMPLANT
GLOVE SURG SS PI 7.0 STRL IVOR (GLOVE) ×2 IMPLANT
GOWN STRL REUS W/ TWL LRG LVL3 (GOWN DISPOSABLE) ×2 IMPLANT
GOWN STRL REUS W/ TWL XL LVL3 (GOWN DISPOSABLE) ×2 IMPLANT
GOWN STRL REUS W/TWL LRG LVL3 (GOWN DISPOSABLE) ×6
GOWN STRL REUS W/TWL XL LVL3 (GOWN DISPOSABLE) ×9
HANDLE STAPLE ENDO GIA SHORT (STAPLE) ×2
HEMOSTAT SURGICEL 2X14 (HEMOSTASIS) ×2 IMPLANT
KIT BASIN OR (CUSTOM PROCEDURE TRAY) ×3 IMPLANT
KIT ROOM TURNOVER OR (KITS) ×3 IMPLANT
KIT SUCTION CATH 14FR (SUCTIONS) ×3 IMPLANT
LIQUID BAND (GAUZE/BANDAGES/DRESSINGS) ×2 IMPLANT
NS IRRIG 1000ML POUR BTL (IV SOLUTION) ×12 IMPLANT
PACK CHEST (CUSTOM PROCEDURE TRAY) ×3 IMPLANT
PAD ARMBOARD 7.5X6 YLW CONV (MISCELLANEOUS) ×6 IMPLANT
POUCH ENDO CATCH II 15MM (MISCELLANEOUS) ×2 IMPLANT
POUCH SPECIMEN RETRIEVAL 10MM (ENDOMECHANICALS) IMPLANT
PROBE CRYO2-ABLATION MALLABLE (MISCELLANEOUS) ×2 IMPLANT
RELOAD EGIA 45 MED/THCK PURPLE (STAPLE) ×10 IMPLANT
RELOAD EGIA 45 TAN VASC (STAPLE) ×2 IMPLANT
RELOAD EGIA 60 MED/THCK PURPLE (STAPLE) ×6 IMPLANT
RELOAD EGIA TRIS TAN 45 CVD (STAPLE) ×15 IMPLANT
RELOAD STAPLE 45 TAN MED CVD (STAPLE) IMPLANT
RELOAD STAPLE 60 MED/THCK ART (STAPLE) IMPLANT
SCISSORS ENDO CVD 5DCS (MISCELLANEOUS) IMPLANT
SEALANT PROGEL (MISCELLANEOUS) ×2 IMPLANT
SEALANT SURG COSEAL 4ML (VASCULAR PRODUCTS) IMPLANT
SEALANT SURG COSEAL 8ML (VASCULAR PRODUCTS) IMPLANT
SOLUTION ANTI FOG 6CC (MISCELLANEOUS) ×3 IMPLANT
SPECIMEN JAR MEDIUM (MISCELLANEOUS) ×3 IMPLANT
SPONGE GAUZE 4X4 12PLY STER LF (GAUZE/BANDAGES/DRESSINGS) ×2 IMPLANT
SPONGE INTESTINAL PEANUT (DISPOSABLE) ×2 IMPLANT
STAPLER ENDO GIA 12 SHRT THIN (STAPLE) IMPLANT
STAPLER ENDO GIA 12MM SHORT (STAPLE) ×1 IMPLANT
SUT PROLENE 4 0 RB 1 (SUTURE)
SUT PROLENE 4-0 RB1 .5 CRCL 36 (SUTURE) IMPLANT
SUT SILK  1 MH (SUTURE) ×4
SUT SILK 1 MH (SUTURE) ×1 IMPLANT
SUT SILK 1 TIES 10X30 (SUTURE) ×1 IMPLANT
SUT SILK 2 0 SH (SUTURE) IMPLANT
SUT SILK 2 0SH CR/8 30 (SUTURE) IMPLANT
SUT SILK 3 0 SH 30 (SUTURE) IMPLANT
SUT SILK 3 0SH CR/8 30 (SUTURE) IMPLANT
SUT VIC AB 0 CTX 27 (SUTURE) IMPLANT
SUT VIC AB 1 CTX 27 (SUTURE) ×2 IMPLANT
SUT VIC AB 2-0 CT1 27 (SUTURE)
SUT VIC AB 2-0 CT1 TAPERPNT 27 (SUTURE) IMPLANT
SUT VIC AB 2-0 CTX 36 (SUTURE) IMPLANT
SUT VIC AB 3-0 MH 27 (SUTURE) IMPLANT
SUT VIC AB 3-0 SH 27 (SUTURE)
SUT VIC AB 3-0 SH 27X BRD (SUTURE) IMPLANT
SUT VIC AB 3-0 X1 27 (SUTURE) ×4 IMPLANT
SUT VICRYL 0 UR6 27IN ABS (SUTURE) ×6 IMPLANT
SUT VICRYL 2 TP 1 (SUTURE) IMPLANT
SWAB COLLECTION DEVICE MRSA (MISCELLANEOUS) IMPLANT
SYSTEM SAHARA CHEST DRAIN ATS (WOUND CARE) ×3 IMPLANT
TAPE CLOTH SURG 4X10 WHT LF (GAUZE/BANDAGES/DRESSINGS) ×2 IMPLANT
TIP APPLICATOR SPRAY EXTEND 16 (VASCULAR PRODUCTS) IMPLANT
TOWEL OR 17X24 6PK STRL BLUE (TOWEL DISPOSABLE) ×3 IMPLANT
TOWEL OR 17X26 10 PK STRL BLUE (TOWEL DISPOSABLE) ×6 IMPLANT
TRAP SPECIMEN MUCOUS 40CC (MISCELLANEOUS) IMPLANT
TRAY FOLEY CATH 16FRSI W/METER (SET/KITS/TRAYS/PACK) ×3 IMPLANT
TROCAR XCEL BLADELESS 5X75MML (TROCAR) ×3 IMPLANT
TROCAR XCEL NON-BLD 5MMX100MML (ENDOMECHANICALS) IMPLANT
TUBE ANAEROBIC SPECIMEN COL (MISCELLANEOUS) IMPLANT
TUNNELER SHEATH ON-Q 11GX8 DSP (PAIN MANAGEMENT) IMPLANT
WATER STERILE IRR 1000ML POUR (IV SOLUTION) ×4 IMPLANT

## 2015-02-23 NOTE — Brief Op Note (Addendum)
02/23/2015  9:05 PM  PATIENT:  Russell [name]  69 y.o. male  PRE-OPERATIVE DIAGNOSIS:  Postoperative Bleeding  POST-OPERATIVE DIAGNOSIS:  Postoperative Bleeding  PROCEDURE:   Left Video Assisted Thoracoscopy Exploration Left Chest for post operative bleeding.   FINDINGS: Most prominent bleeding was from chest tube site. Multiple other minor bleeding sites.  SURGEON:  Surgeon(s) and Role:    * Melrose Nakayama, MD - Primary  PHYSICIAN ASSISTANT: Lars Pinks PA-C  ANESTHESIA:   general  EBL:  Total I/O In: 9024 [I.V.:2500; Blood:1516] Out: 1975 [Urine:475; Blood:1500]  BLOOD ADMINISTERED:3 CC PRBC and 2 FFP  DRAINS: 28 Blake and 28 [place] straigh chest tube placed in the left pleural space   COUNTS CORRECT:  YES  PLAN OF CARE: Admit to inpatient   PATIENT DISPOSITION:  PACU - hemodynamically stable.   Delay start of Pharmacological VTE agent (>24hrs) due to surgical blood loss or risk of bleeding: yes

## 2015-02-23 NOTE — Progress Notes (Signed)
REport to [employer]

## 2015-02-23 NOTE — Progress Notes (Signed)
      [address]       [place] [address]             [phone]        Russell [name] has been bleeding since coming to the PACU.  He initially put out about 800 ml. The bleeding then slowed significantly and it appeared we would be able to avoid a return to the OR. However over the past 30 minutes his bleeding has increased dramatically (400 ml in 30 minutes) and we need to take him back for a re-exploration.  OR notified.  Mrs. Georgiou informed of the situation

## 2015-02-23 NOTE — Anesthesia Preprocedure Evaluation (Signed)
Anesthesia Evaluation  Patient identified by MRN, date of birth, ID band Patient awake    Reviewed: Allergy & Precautions, H&P , NPO status , Patient's Chart, lab work & pertinent test results  Airway Mallampati: II  TM Distance: >3 FB Neck ROM: full    Dental  (+) Dental Advisory Given, Caps All upper front are capped:   Pulmonary neg pulmonary ROS, former smoker,  Stop bang 5. Lung mass  Lower lobe left. breath sounds clear to auscultation  Pulmonary exam normal       Cardiovascular Exercise Tolerance: Good hypertension, Pt. 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 Coronary artery calcification seen on CT   Neuro/Psych PSYCHIATRIC DISORDERS Depression negative neurological ROS     GI/Hepatic negative GI ROS, Neg liver ROS,   Endo/Other  negative endocrine ROS  Renal/GU Renal disease     Musculoskeletal  (+) Arthritis -,   Abdominal   Peds  Hematology negative hematology ROS (+)   Anesthesia Other Findings   Reproductive/Obstetrics negative OB ROS                             Anesthesia Physical  Anesthesia Plan  ASA: III  Anesthesia Plan: General   Post-op Pain Management:    Induction: Intravenous  Airway Management Planned: Oral ETT and Double Lumen EBT  Additional Equipment: Arterial line and CVP  Intra-op Plan:   Post-operative Plan: Extubation in OR  Informed Consent: I have reviewed the patients History and Physical, chart, labs and discussed the procedure including the risks, benefits and alternatives for the proposed anesthesia with the patient or authorized representative who has indicated his/her understanding and acceptance.   Dental Advisory Given  Plan Discussed with: CRNA  Anesthesia Plan Comments:         Anesthesia Quick Evaluation

## 2015-02-23 NOTE — Transfer of Care (Signed)
Immediate Anesthesia Transfer of Care Note  Patient: Russell [name]  Procedure(s) Performed: Procedure(s): Video assisted thorocoscopy, exploration left chest for post operative bleeding. (Left)  Patient Location: PACU  Anesthesia Type:General  Level of Consciousness: sedated  Airway & Oxygen Therapy: Patient connected to face mask oxygen  Post-op Assessment: Report given to RN, Post -op Vital signs reviewed and stable and Patient moving all extremities X 4  Post vital signs: Reviewed and stable  Last Vitals:  Filed Vitals:   02/23/15 2212  BP: 112/48  Pulse: 72  Temp:   Resp: 16    Complications: No apparent anesthesia complications

## 2015-02-23 NOTE — Transfer of Care (Signed)
Immediate Anesthesia Transfer of Care Note  Patient: Russell [name]  Procedure(s) Performed: Procedure(s): LEFT VIDEO ASSISTED THORACOSCOPY  (Left)  LEFT LOWER LOBECTOMY WITH NODE DISSECTION (Left) CRYO INTERCOSTAL NERVE BLOCK (Left)  Patient Location: PACU  Anesthesia Type:General  Level of Consciousness: awake, sedated, patient cooperative and confused  Airway & Oxygen Therapy: Patient Spontanous Breathing and Patient connected to face mask oxygen  Post-op Assessment: Report given to RN, Post -op Vital signs reviewed and stable and Patient moving all extremities  Post vital signs: Reviewed and stable  Last Vitals:  Filed Vitals:   02/23/15 1107  BP: 146/60  Pulse:   Temp:   Resp:     Complications: No apparent anesthesia complications

## 2015-02-23 NOTE — Progress Notes (Signed)
700cc bloody drng quickly draining into pleuravac. Dr.Hendrickson at bedside and aware. CBC ordered. 500 cc NS IVFB ordered. Will give and cont to monitor.

## 2015-02-23 NOTE — Anesthesia Preprocedure Evaluation (Signed)
Anesthesia Evaluation  Patient identified by MRN, date of birth, ID band Patient awake    Reviewed: Allergy & Precautions, NPO status , Patient's Chart, lab work & pertinent test results  Airway Mallampati: II  TM Distance: >3 FB   Mouth opening: Limited Mouth Opening  Dental  (+) Teeth Intact, Dental Advisory Given   Pulmonary former smoker,  breath sounds clear to auscultation        Cardiovascular hypertension, Pt. on medications + CAD Rhythm:Regular Rate:Normal     Neuro/Psych    GI/Hepatic   Endo/Other    Renal/GU      Musculoskeletal   Abdominal   Peds  Hematology   Anesthesia Other Findings   Reproductive/Obstetrics                             Anesthesia Physical Anesthesia Plan  ASA: III and emergent  Anesthesia Plan: General   Post-op Pain Management:    Induction: Intravenous  Airway Management Planned: Double Lumen EBT  Additional Equipment:   Intra-op Plan:   Post-operative Plan: Possible Post-op intubation/ventilation  Informed Consent: I have reviewed the patients History and Physical, chart, labs and discussed the procedure including the risks, benefits and alternatives for the proposed anesthesia with the patient or authorized representative who has indicated his/her understanding and acceptance.   Dental advisory given  Plan Discussed with: CRNA, Anesthesiologist and Surgeon  Anesthesia Plan Comments:         Anesthesia Quick Evaluation

## 2015-02-23 NOTE — Anesthesia Procedure Notes (Signed)
Procedure Name: Intubation Date/Time: 02/23/2015 12:20 PM Performed by: Izora Gala Pre-anesthesia Checklist: Patient identified, Emergency Drugs available, Suction available, Patient being monitored and Timeout performed Patient Re-evaluated:Patient Re-evaluated prior to inductionOxygen Delivery Method: Circle system utilized Preoxygenation: Pre-oxygenation with 100% oxygen Intubation Type: IV induction Ventilation: Two handed mask ventilation required and Oral airway inserted - appropriate to patient size Laryngoscope Size: Mac and 3 Grade View: Grade I Endobronchial tube: Double lumen EBT, EBT position confirmed by auscultation and EBT position confirmed by fiberoptic bronchoscope and 39 Fr Number of attempts: 1 Airway Equipment and Method: Stylet and Fiberoptic brochoscope (DLT placement confirmed with fiberoptic) Placement Confirmation: ETT inserted through vocal cords under direct vision,  positive ETCO2,  CO2 detector and breath sounds checked- equal and bilateral Secured at: 32 cm Tube secured with: Tape Dental Injury: Teeth and Oropharynx as per pre-operative assessment  Comments: Intubation by Ainsley Spinner SRNA

## 2015-02-23 NOTE — Anesthesia Postprocedure Evaluation (Signed)
  Anesthesia Post-op Note  Patient: Russell [name]  Procedure(s) Performed: Procedure(s): LEFT VIDEO ASSISTED THORACOSCOPY  (Left)  LEFT LOWER LOBECTOMY WITH NODE DISSECTION (Left) CRYO INTERCOSTAL NERVE BLOCK (Left)  Patient Location: PACU  Anesthesia Type:General  Level of Consciousness: awake, alert  and oriented  Airway and Oxygen Therapy: Patient Spontanous Breathing and Patient connected to nasal cannula oxygen  Post-op Pain: moderate  Post-op Assessment: Post-op Vital signs reviewed  Post-op Vital Signs: Reviewed  Last Vitals:  Filed Vitals:   02/23/15 1845  BP:   Pulse: 70  Temp:   Resp: 18    Complications: No apparent anesthesia complications but has post op bleeding and will return to OR for exploration.

## 2015-02-23 NOTE — Interval H&P Note (Signed)
History and Physical Interval Note:  02/23/2015 11:23 AM  Russell [name]  has presented today for surgery, with the diagnosis of LLL MASS  The various methods of treatment have been discussed with the patient and family. After consideration of risks, benefits and other options for treatment, the patient has consented to  Procedure(s): VIDEO ASSISTED THORACOSCOPY (VATS)/WEDGE RESECTION (Left) POSSIBLE LEFT LOWER LOBECTOMY (Left) CRYO INTERCOSTAL NERVE BLOCK (N/A) as a surgical intervention .  The patient's history has been reviewed, patient examined, no change in status, stable for surgery.  I have reviewed the patient's chart and labs.  Questions were answered to the patient's satisfaction.     Melrose Nakayama

## 2015-02-23 NOTE — Anesthesia Procedure Notes (Signed)
Procedure Name: Intubation Date/Time: 02/23/2015 7:21 PM Performed by: Valetta Fuller Pre-anesthesia Checklist: Patient identified, Emergency Drugs available, Suction available and Patient being monitored Patient Re-evaluated:Patient Re-evaluated prior to inductionOxygen Delivery Method: Circle system utilized Preoxygenation: Pre-oxygenation with 100% oxygen Intubation Type: IV induction Ventilation: Mask ventilation without difficulty Laryngoscope Size: Miller and 2 Grade View: Grade I Endobronchial tube: Double lumen EBT, Left, EBT position confirmed by auscultation and EBT position confirmed by fiberoptic bronchoscope and 39 Fr Number of attempts: 1 Airway Equipment and Method: Stylet Placement Confirmation: ETT inserted through vocal cords under direct vision,  positive ETCO2 and breath sounds checked- equal and bilateral Tube secured with: Tape Dental Injury: Teeth and Oropharynx as per pre-operative assessment

## 2015-02-23 NOTE — Progress Notes (Signed)
Dr. Roxan Hockey also aware of BP when ordering NS 500cc

## 2015-02-23 NOTE — H&P (View-Only) (Signed)
PCP is MCNEILL,WENDY, MD Referring Provider is Tanda Rockers, MD  Urologist: Irine Seal, MD  Chief Complaint  Patient presents with  . Lung Mass    LLLobe....Marland KitchenCT CHEST 01/29/15.Marland KitchenPET 02/04/15...only spirometry done    HPI: 69 year old man sent for consultation regarding a left lower lobe mass.  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 He has a history of kidney stones and hematuria. He recently had recurrent hematuria. As part of his workup he had a CT of the abdomen and pelvis. A left lower lobe mass was noted on the CT scan. He then had a CT of the chest which showed a 3.5 cm spiculated mass in the left lower lobe. There was some mild hilar adenopathy. There was pulmonary fibrosis noted. A small nodule in the right upper lobe showed calcification. He then had a PET/CT which showed the left lower lobe mass was hypermetabolic. The right upper lobe nodule was not hypermetabolic. There was no evidence of regional or distant metastases.  He says that he walks on a daily basis. He can walk about a mile without any shortness of breath chest pain, pressure, or tightness. He can walk up and down stairs without difficulty. He denies cough, hemoptysis, wheezing, shortness of breath, peripheral edema. His weight is unchanged over the past 3 in 6 months.  Zubrod Score: At the time of surgery this patient's most appropriate activity status/level should be described as: [x]     0    Normal activity, no symptoms []     1    Restricted in physical strenuous activity but ambulatory, able to do out light work []     2    Ambulatory and capable of self care, unable to do work activities, up and about >50 % of waking hours                              []     3    Only limited self care, in bed greater than 50% of waking hours []     4    Completely disabled, no self care, confined to bed or chair []     5     Moribund    Past Medical History  Diagnosis Date  . AAA (abdominal aortic aneurysm)   . Hyperlipidemia   . Depression     Past Surgical History  Procedure Laterality Date  . Cholecystectomy      Family History  Problem Relation Age of Onset  . Anuerysm Father     Social History History  Substance Use Topics  . Smoking status: Former Smoker -- 1.00 packs/day for 50 years    Types: Cigarettes    Quit date: 12/13/2007  . Smokeless tobacco: Never Used  . Alcohol Use: 0.0 oz/week    0 Standard drinks or equivalent per week    Current Outpatient Prescriptions  Medication Sig Dispense Refill  . aspirin 81 MG tablet Take 81 mg by mouth daily.    Marland Kitchen FLUoxetine (PROZAC) 20 MG capsule Take 20 mg by mouth daily.    Marland Kitchen GLUCOSAMINE PO Take by mouth.    . losartan (COZAAR) 50 MG tablet Take 50 mg by mouth daily.    . Omega-3 Fatty Acids (FISH OIL) 1000 MG CAPS Take 1,000 mg by mouth daily.    . simvastatin (ZOCOR) 40 MG tablet  Take 40 mg by mouth daily.     No current facility-administered medications for this visit.    No Known Allergies  Review of Systems  Constitutional: Negative for fever, chills, activity change, appetite change, fatigue and unexpected weight change.  Respiratory: Negative for cough, shortness of breath, wheezing and stridor.   Cardiovascular: Negative for chest pain and leg swelling.  Genitourinary: Positive for hematuria.  Hematological: Negative for adenopathy. Does not bruise/bleed easily.  Psychiatric/Behavioral: Positive for dysphoric mood.  All other systems reviewed and are negative.   BP 132/73 mmHg  Pulse 72  Resp 16  Ht 6' (1.829 m)  Wt 190 lb (86.183 kg)  BMI 25.76 kg/m2  SpO2 97% Physical Exam  Constitutional: He is oriented to person, place, and time. He appears well-developed and well-nourished. No distress.  HENT:  Head: Normocephalic and atraumatic.  Eyes: EOM are normal. Pupils are equal, round, and reactive to light.  Neck:  Neck supple. No thyromegaly present.  Cardiovascular: Normal rate, regular rhythm, normal heart sounds and intact distal pulses.  Exam reveals no gallop and no friction rub.   No murmur heard. Pulmonary/Chest: Effort normal and breath sounds normal. He has no wheezes. He has no rales.  Abdominal: Soft. There is no tenderness.  Musculoskeletal: He exhibits no edema.  Lymphadenopathy:    He has no cervical adenopathy.  Neurological: He is alert and oriented to person, place, and time. No cranial nerve deficit.  Skin: Skin is warm and dry.  Psychiatric: He has a normal mood and affect.  Vitals reviewed.    Diagnostic Tests: NUCLEAR MEDICINE PET SKULL BASE TO THIGH  TECHNIQUE: 9.5 mCi F-18 FDG was injected intravenously. Full-ring PET imaging was performed from the skull base to thigh after the radiotracer. CT data was obtained and used for attenuation correction and anatomic localization.  FASTING BLOOD GLUCOSE: Value: One hundred three mg/dl  COMPARISON: CT chest 01/29/2015 and CT abdomen pelvis 01/23/2015.  FINDINGS: NECK  No hypermetabolic lymph nodes in the neck. CT images show no acute findings.  CHEST  A 3.4 x 4.0 cm spiculated mass in the superior segment left lower lobe has an SUV max of 15.3. No hypermetabolic mediastinal, hilar or axillary lymph nodes. CT images show no acute findings. Coronary artery calcification. Heart is mildly enlarged. No pericardial effusion. Centrilobular emphysema.  ABDOMEN/PELVIS  No abnormal hypermetabolism in the liver, adrenal glands, spleen or pancreas. No hypermetabolic lymph nodes. There is focal hypermetabolism in the low central portion of the prostate. CT images show a 1.5 cm low-attenuation lesion in the left hepatic lobe, unchanged from 04/12/2010, favoring a cyst. Cholecystectomy. Adrenal glands and right kidney are unremarkable. Left renal stones. A 9 mm stone is again seen in the distal left ureter, at the  left ureterovesical junction. 10 mm stone is seen dependently in the bladder, stable. Spleen, pancreas, stomach and bowel are grossly unremarkable. No free fluid. Atherosclerotic calcification of the arterial vasculature. Infrarenal aortic aneurysm measures 4.3 cm.  SKELETON  No abnormal osseous hypermetabolism. Degenerative changes are seen in the spine.  IMPRESSION: 1. Hypermetabolic spiculated left lower lobe mass without evidence of metastatic disease. 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 2. Focal hypermetabolism in the low central prostate is nonspecific. Consider correlation with PSA, as clinically indicated. 3. Coronary artery calcification. 4. Left renal stones. Stones are again seen in the distal left ureter and bladder. 5. Infrarenal aortic aneurysm, as on 01/23/2015.   Electronically  Signed  By: Russell [name] M.D.  On: 02/04/2015 10:32  Pulmonary function tests   FVC= 3.77 (78%) FEV1= 3.14(86%) FEV1/FVC= 83%  Impression: 68 year old man with a newly discovered 3.5 cm spiculated mass in the left lower lobe. This is hypermetabolic by PET CT. Given his age, smoking history, and appearance of the lesion this is a lung cancer until proven otherwise.  He has excellent functional status. His FEV1 is 86% predicted. He has coronary calcification on CT scan, but is completely asymptomatic. Overall he is an excellent surgical candidate.  I discussed an algorithm for diagnosis and treatment. I discussed possibility of doing a CT-guided biopsy or bronchoscopic biopsy. If those were positive it would not change management. Also they were negative I would not rely on that and would still recommend surgical resection. Therefore see no reason to do a preoperative biopsy.  I recommended that we proceed directly to left VATS, wedge resection, possible left lower lobectomy. I described the general nature of the  procedure to him including the need for general anesthesia, the incisions to be used, the intraoperative decision making, the expected hospital stay, and overall recovery. I reviewed the indications, risks, benefits, and alternatives. He understands the risk include, but are not limited to death, MI, DVT, PE, bleeding, possible need for transfusion, infection, prolonged air leak, cardiac arrhythmias, as well as possibility of unforeseeable competitions. We also discussed the possibility of cryo-analgesia intercostal nerves to assist with postoperative pain management. He does wish to have that procedure done as well.  He understands and accepts the risks and wishes to proceed.  Plan:  Left VATS, wedge resection, possible left lower lobectomy, cryo-analgesia intercostal nerves on Monday, March 14.  Russell Standard Roxan Hockey, MD [facility] [phone]

## 2015-02-23 NOTE — Brief Op Note (Addendum)
02/23/2015  4:07 PM  PATIENT:  Russell [name]  69 y.o. 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  PROCEDURE: LEFT VIDEO ASSISTED THORACOSCOPY THORACOSCOPIC LEFT LOWER LOBECTOMY WITH MEDIASTINAL NODE DISSECTION, CRYOABLATION INTERCOSTAL NERVE BLOCK   SURGEON:  Surgeon(s) and Role:    * Melrose Nakayama, MD - Primary  PHYSICIAN ASSISTANT: Lars Pinks PA-C  ANESTHESIA:   general  EBL:  Total I/O In: 1250 [I.V.:1000; IV Piggyback:250] Out: 4128 [Urine:525; Blood:500]  BLOOD ADMINISTERED:none  DRAINS: 36 Blake and 28 straight French chest tube placed in the left pleural space   SPECIMEN:  Source of Specimen:  LLL, multiple lymph nodes  DISPOSITION OF SPECIMEN:  PATHOLOGY  COUNTS CORRECT:  YES  PLAN OF CARE: Admit to inpatient   PATIENT DISPOSITION:  PACU - hemodynamically stable.   Delay start of Pharmacological VTE agent (>24hrs) due to surgical blood loss or risk of bleeding: yes  FINDINGS:  ~ 4 cm mass left lower lobe. No way to perform wedge resection. Multiple enlarged nodes.  Frozen showed Non-small cell lung cancer, bronchial margin negative.

## 2015-02-23 NOTE — OR Nursing (Signed)
Patient arrived emergently to OR.  Foley intact, compression hose on, Pleuravac noted with approx 1800cc blood.

## 2015-02-24 ENCOUNTER — Inpatient Hospital Stay (HOSPITAL_COMMUNITY): Payer: Medicare Other

## 2015-02-24 ENCOUNTER — Encounter (HOSPITAL_COMMUNITY): Payer: Self-pay | Admitting: Thoracic Surgery (Cardiothoracic Vascular Surgery)

## 2015-02-24 LAB — GLUCOSE, CAPILLARY
Glucose-Capillary: 142 mg/dL — ABNORMAL HIGH (ref 70–99)
Glucose-Capillary: 119 mg/dL — ABNORMAL HIGH (ref 70–99)
Glucose-Capillary: 148 mg/dL — ABNORMAL HIGH (ref 70–99)
Glucose-Capillary: 131 mg/dL — ABNORMAL HIGH (ref 70–99)

## 2015-02-24 LAB — BASIC METABOLIC PANEL
ANION GAP: 3 — AB (ref 5–15)
BUN: 17 mg/dL (ref 6–23)
CHLORIDE: 107 mmol/L (ref 96–112)
CO2: 27 mmol/L (ref 19–32)
CREATININE: 0.94 mg/dL (ref 0.50–1.35)
Calcium: 7.3 mg/dL — ABNORMAL LOW (ref 8.4–10.5)
GFR calc Af Amer: >90 mL/min (ref >90)
GFR calc non Af Amer: 83 mL/min — ABNORMAL LOW (ref >90)
GLUCOSE: 129 mg/dL — AB (ref 70–99)
Potassium: 4.5 mmol/L (ref 3.5–5.1)
Sodium: 137 mmol/L (ref 135–145)

## 2015-02-24 LAB — POCT I-STAT 3, ART BLOOD GAS (G3+)
Acid-base deficit: 2 mmol/L (ref 0.0–2.0)
BICARBONATE: 24.3 meq/L — AB (ref 20.0–24.0)
O2 Saturation: 96 %
PH ART: 7.314 — AB (ref 7.350–7.450)
Patient temperature: 98.2
TCO2: 26 mmol/L (ref 0–100)
pCO2 arterial: 47.6 mmHg — ABNORMAL HIGH (ref 35.0–45.0)
pO2, Arterial: 92 mmHg (ref 80.0–100.0)

## 2015-02-24 LAB — PREPARE FRESH FROZEN PLASMA
Unit division: 0
Unit division: 0

## 2015-02-24 LAB — CBC
HCT: 32.1 % — ABNORMAL LOW (ref 39.0–52.0)
HEMOGLOBIN: 10.9 g/dL — AB (ref 13.0–17.0)
MCH: 29.6 pg (ref 26.0–34.0)
MCHC: 34 g/dL (ref 30.0–36.0)
MCV: 87.2 fL (ref 78.0–100.0)
PLATELETS: 153 10*3/uL (ref 150–400)
RBC: 3.68 MIL/uL — AB (ref 4.22–5.81)
RDW: 13.2 % (ref 11.5–15.5)
WBC: 12.5 10*3/uL — ABNORMAL HIGH (ref 4.0–10.5)

## 2015-02-24 MED ORDER — ACETAMINOPHEN 500 MG PO TABS
1000.0000 mg | ORAL_TABLET | Freq: Four times a day (QID) | ORAL | Status: AC
  Administered 2015-02-24 – 2015-02-28 (×15): 1000 mg via ORAL
  Filled 2015-02-24 (×19): qty 2

## 2015-02-24 MED ORDER — ACETAMINOPHEN 160 MG/5ML PO SOLN
1000.0000 mg | Freq: Four times a day (QID) | ORAL | Status: AC
  Filled 2015-02-24: qty 40

## 2015-02-24 MED ORDER — OMEGA-3-ACID ETHYL ESTERS 1 G PO CAPS
1.0000 g | ORAL_CAPSULE | Freq: Every day | ORAL | Status: DC
  Administered 2015-02-24 – 2015-03-01 (×6): 1 g via ORAL
  Filled 2015-02-24 (×6): qty 1

## 2015-02-24 MED ORDER — BUPROPION HCL 75 MG PO TABS
150.0000 mg | ORAL_TABLET | Freq: Two times a day (BID) | ORAL | Status: DC
  Administered 2015-02-24 – 2015-03-01 (×11): 150 mg via ORAL
  Filled 2015-02-24 (×12): qty 2

## 2015-02-24 MED ORDER — PANTOPRAZOLE SODIUM 40 MG PO TBEC
40.0000 mg | DELAYED_RELEASE_TABLET | Freq: Every day | ORAL | Status: DC
  Administered 2015-02-24 – 2015-02-28 (×5): 40 mg via ORAL
  Filled 2015-02-24 (×5): qty 1

## 2015-02-24 MED ORDER — FISH OIL 1000 MG PO CAPS: 1000.0000 mg | ORAL_CAPSULE | Freq: Every day | ORAL | Status: DC

## 2015-02-24 MED ORDER — INSULIN ASPART 100 UNIT/ML ~~LOC~~ SOLN
0.0000 [IU] | Freq: Four times a day (QID) | SUBCUTANEOUS | Status: DC
  Administered 2015-02-24 – 2015-02-26 (×5): 2 [IU] via SUBCUTANEOUS

## 2015-02-24 MED ORDER — SIMVASTATIN 40 MG PO TABS
40.0000 mg | ORAL_TABLET | Freq: Every evening | ORAL | Status: DC
  Administered 2015-02-24: 40 mg via ORAL
  Filled 2015-02-24 (×2): qty 1

## 2015-02-24 MED ORDER — BISACODYL 5 MG PO TBEC
10.0000 mg | DELAYED_RELEASE_TABLET | Freq: Every day | ORAL | Status: DC
  Administered 2015-02-24 – 2015-02-27 (×3): 10 mg via ORAL
  Filled 2015-02-24 (×3): qty 2

## 2015-02-24 MED ORDER — DIPHENHYDRAMINE HCL 50 MG/ML IJ SOLN: 12.5000 mg | Freq: Four times a day (QID) | INTRAMUSCULAR | Status: DC | PRN

## 2015-02-24 MED ORDER — DEXTROSE 5 % IV SOLN
1.5000 g | Freq: Two times a day (BID) | INTRAVENOUS | Status: AC
  Administered 2015-02-24 (×2): 1.5 g via INTRAVENOUS
  Filled 2015-02-24 (×2): qty 1.5

## 2015-02-24 MED ORDER — OXYCODONE HCL 5 MG PO TABS
5.0000 mg | ORAL_TABLET | ORAL | Status: DC | PRN
  Administered 2015-02-25: 10 mg via ORAL
  Administered 2015-03-01: 5 mg via ORAL
  Filled 2015-02-24: qty 1
  Filled 2015-02-24: qty 2

## 2015-02-24 MED ORDER — CETYLPYRIDINIUM CHLORIDE 0.05 % MT LIQD
7.0000 mL | Freq: Two times a day (BID) | OROMUCOSAL | Status: DC
  Administered 2015-02-24 – 2015-02-28 (×8): 7 mL via OROMUCOSAL

## 2015-02-24 MED ORDER — ONDANSETRON HCL 4 MG/2ML IJ SOLN: 4.0000 mg | Freq: Four times a day (QID) | INTRAMUSCULAR | Status: DC | PRN

## 2015-02-24 MED ORDER — FLUOXETINE HCL 20 MG PO CAPS
20.0000 mg | ORAL_CAPSULE | Freq: Every morning | ORAL | Status: DC
  Administered 2015-02-24 – 2015-03-01 (×6): 20 mg via ORAL
  Filled 2015-02-24 (×6): qty 1

## 2015-02-24 MED ORDER — POTASSIUM CHLORIDE 10 MEQ/50ML IV SOLN
10.0000 meq | Freq: Every day | INTRAVENOUS | Status: DC | PRN
  Filled 2015-02-24 (×3): qty 50

## 2015-02-24 MED ORDER — ASPIRIN EC 81 MG PO TBEC
81.0000 mg | DELAYED_RELEASE_TABLET | Freq: Every day | ORAL | Status: DC
  Administered 2015-02-25 – 2015-03-01 (×5): 81 mg via ORAL
  Filled 2015-02-24 (×5): qty 1

## 2015-02-24 MED ORDER — PHENYLEPHRINE HCL 10 MG/ML IJ SOLN
0.0000 ug/min | INTRAVENOUS | Status: DC
  Administered 2015-02-24: 10 ug/min via INTRAVENOUS
  Administered 2015-02-24: 20 ug/min via INTRAVENOUS
  Filled 2015-02-24 (×3): qty 1

## 2015-02-24 MED ORDER — METOCLOPRAMIDE HCL 5 MG/ML IJ SOLN
10.0000 mg | Freq: Four times a day (QID) | INTRAMUSCULAR | Status: AC
  Administered 2015-02-24 (×4): 10 mg via INTRAVENOUS
  Filled 2015-02-24 (×4): qty 2

## 2015-02-24 MED ORDER — SENNOSIDES-DOCUSATE SODIUM 8.6-50 MG PO TABS
1.0000 | ORAL_TABLET | Freq: Every day | ORAL | Status: DC
Start: 2015-02-24 — End: 2015-03-01
  Administered 2015-02-24 – 2015-02-27 (×4): 1 via ORAL
  Filled 2015-02-24 (×6): qty 1

## 2015-02-24 MED ORDER — TRAMADOL HCL 50 MG PO TABS
50.0000 mg | ORAL_TABLET | Freq: Four times a day (QID) | ORAL | Status: DC | PRN
  Administered 2015-02-24 – 2015-02-25 (×2): 100 mg via ORAL
  Filled 2015-02-24 (×2): qty 2

## 2015-02-24 MED ORDER — DIPHENHYDRAMINE HCL 12.5 MG/5ML PO ELIX
12.5000 mg | ORAL_SOLUTION | Freq: Four times a day (QID) | ORAL | Status: DC | PRN
  Filled 2015-02-24: qty 5

## 2015-02-24 NOTE — Op Note (Signed)
NAME:  Russell [name], Russell [name]            ACCOUNT NO.:  [id number]  MEDICAL RECORD NO.:  [id number]  LOCATION:  2S01C                        FACILITY:  [facility]  PHYSICIAN:  Revonda Standard. Roxan Hockey, M.D.DATE OF BIRTH:  1946/10/19  DATE OF PROCEDURE:  02/23/2015 DATE OF DISCHARGE:                              OPERATIVE REPORT   PREOPERATIVE DIAGNOSIS:  Postoperative bleeding following thoracoscopic left lower lobectomy. Hemorrhagic shock.  POSTOPERATIVE DIAGNOSIS:  Postoperative bleeding following thoracoscopic left lower lobectomy. Hemorrhagic shock.  PROCEDURE:  Left video-assisted thoracoscopy for re-exploration for bleeding, evacuation of hemothorax, control of multiple bleeding sites.  SURGEON:  Revonda Standard. Roxan Hockey, MD  ASSISTANT:  Lars Pinks, PA-C  ANESTHESIA:  General.  FINDINGS:  Large, partially clotted, hemothorax .  Primary bleeding site appeared to be the anterior chest tube site.  Multiple other minor sites including incision and sites of lymph node dissection.   No active bleeding at time of closure.  CLINICAL NOTE:  Russell [name] is a 69 year old gentleman, who earlier in the day had undergone a left video-assisted thoracoscopic lower lobectomy.  In the early postoperative period, he had bleeding from his chest tube.  This initially appeared to slow, but then he had marked bleeding and hypotension.  He and his wife were advised that he needed to go back to the operating room for control of bleeding.  The indications, risks, benefits, and alternatives were discussed with the patient, but primarily with his wife, since the patient was in the post anesthetic phase.  She accepted the risks and gave consent.  OPERATIVE NOTE:  Russell [name] was brought back to the operating room emergently on February 23, 2015.  He had induction of general anesthesia. Intravenous antibiotics were administered.  Sequential compressive devices were in place for DVT prophylaxis.  He was  placed in a right lateral decubitus position and the left chest was prepped and draped in the usual sterile fashion after removing the chest tubes.  A port was inserted through the posterior stab incision site where the [place] drain had been, and then there was a large hemothorax.  The utility incision was reopened.  There was some oozing from the incision, but no major bleeding.  The hemothorax was evacuated.  There was a total of approximately 1500 mL of blood and clot in the chest. The aortopulmonary window lymph node site was not oozing but not bleeding significantly. The lung was retracted anteriorly exposing the pulmonary artery staple lines.  Those were intact and there was no bleeding. There was some bleeding from the edges of the bronchial staple line consistent with bronchial arterial bleeding.  These areas were cauterized taking care to avoid the bronchus itself.  A clip was applied on 1 side.  There was some bleeding from the Subcarinal lymph node station as well.  All of these bleeding sites appeared rather minor.  There was no major definitive bleeding noted in these areas. The scope then was redirected to view the anterior chest tube site and there was significant bleeding from this and by far was the most notable bleeding that was encountered during the procedure.  This incision was lengthened.  The muscle was divided exposing the intercostal muscle and this area was  cauterized.  Surgicel was applied.  After controlling the bleeding at this site, there was no more ongoing bleeding of note seen anywhere in the chest.  The chest was copiously irrigated with 1 L of saline.  Over the next 30-45 minutes, all the sites were reinspected on multiple occasions and no significant bleeding was noted.  A new incision was made for placement of the anterior chest tube.  This was anterior to the previous incision.  After the tube was placed, the site was visualized with the thoracoscope  and there was no bleeding.  A 28- [place] Blake drain was placed through the posterior port site.  The thoracoscope was moved over to the incision and there was no bleeding around the [place] drain.  The utility incision was closed in standard fashion as was the original chest tube site that had bled.  The chest tubes were placed to suction.  The patient was placed back in the supine position. The patient was observed in the OR with no significant bleeding, so was felt safe to move him. He was taken to the postanesthetic care unit intubated and in stable condition. All sponge, needle, and instrument counts were correct at the end of the procedure.     Revonda Standard Roxan Hockey, M.D.     SCH/MEDQ  D:  02/23/2015  T:  02/24/2015  Job:  [id number]

## 2015-02-24 NOTE — Anesthesia Postprocedure Evaluation (Signed)
  Anesthesia Post-op Note  Patient: Russell [name]  Procedure(s) Performed: Procedure(s): Video assisted thorocoscopy, exploration left chest for post operative bleeding. (Left)  Patient Location: PACU  Anesthesia Type: General   Level of Consciousness: awake, alert  and oriented  Airway and Oxygen Therapy: Patient Spontanous Breathing  Post-op Pain: mild  Post-op Assessment: Post-op Vital signs reviewed  Post-op Vital Signs: Reviewed  Last Vitals:  Filed Vitals:   02/23/15 2345  BP:   Pulse: 84  Temp:   Resp: 19    Complications: No apparent anesthesia complications. However, pt became somnolent with shallow breathing that required Narcan 0.08mg  to improve ventilation.

## 2015-02-24 NOTE — Progress Notes (Addendum)
1 Day Post-Op Procedure(s) (LRB): Video assisted thorocoscopy, exploration left chest for post operative bleeding. (Left) Subjective: Some incisional pain Denies nausea  Objective: Vital signs in last 24 hours: Temp:  [97.5 F (36.4 C)-98.5 F (36.9 C)] 98.5 F (36.9 C) (03/15 0400) Pulse Rate:  [65-96] 82 (03/15 0800) Cardiac Rhythm:  [-] Normal sinus rhythm (03/15 0800) Resp:  [8-29] 20 (03/15 0800) BP: (75-168)/(40-76) 84/48 mmHg (03/15 0800) SpO2:  [90 %-100 %] 95 % (03/15 0800) Arterial Line BP: (73-191)/(35-76) 77/35 mmHg (03/15 0800) FiO2 (%):  [30 %-50 %] 30 % (03/15 0742) Weight:  [193 lb 5 oz (87.686 kg)-193 lb 8 oz (87.771 kg)] 193 lb 8 oz (87.771 kg) (03/15 0500)  Hemodynamic parameters for last 24 hours:    Intake/Output from previous day: 03/14 0701 - 03/15 0700 In: 6555.6 [I.V.:4789.6; Blood:1516; IV Piggyback:250] Out: 5730 [Urine:2040; Blood:2000; Chest Tube:1690] Intake/Output this shift: Total I/O In: 175 [I.V.:125; IV Piggyback:50] Out: 80 [Urine:40; Chest Tube:40]  General appearance: alert, cooperative and no distress Neurologic: intact Heart: regular rate and rhythm Lungs: diminished breath sounds bibasilar and L>R Abdomen: distended, nontender, hypoactive BS small air leak serosanguinous drainage  Lab Results:  Recent Labs  02/23/15 1957 02/24/15 0400  WBC 18.5* 12.5*  HGB 10.2* 10.9*  HCT 29.7* 32.1*  PLT 165 153   BMET:  Recent Labs  02/24/15 0400  NA 137  K 4.5  CL 107  CO2 27  GLUCOSE 129*  BUN 17  CREATININE 0.94  CALCIUM 7.3*    PT/INR:  Recent Labs  02/23/15 1957  LABPROT 16.9*  INR 1.36   ABG    Component Value Date/Time   PHART 7.314* 02/24/2015 0405   HCO3 24.3* 02/24/2015 0405   TCO2 26 02/24/2015 0405   ACIDBASEDEF 2.0 02/24/2015 0405   O2SAT 96.0 02/24/2015 0405   CBG (last 3)   Recent Labs  02/23/15 1645 02/24/15 0153 02/24/15 0540  GLUCAP 150* 142* 119*    Assessment/Plan: S/P  Procedure(s) (LRB): Video assisted thorocoscopy, exploration left chest for post operative bleeding. (Left) POD # 1  CV- BP a little low. His Hgb is OK and is in SR  He has a mild respiratory acidosis and has some gastric dilatation on x-ray which may be contributing  Will use neo drip to maintain MAP of 60  RESP- s/p lobectomy, good sats with O2  IS, OOB  RENAL- lytes and creatinine OK  Will need diuresis when BP allows  ENDO- CBG OK  GI- gastric dilatation- will limit to sips of clears, reglan x 24 hours  Acute blood loss anemia- required transfusion during re-exploration  Hct= 32- follow  DVT prophylaxis- SCD, will hold enoxaparin due to bleeding yesterday  mobilize   LOS: 1 day    Melrose Nakayama 02/24/2015

## 2015-02-24 NOTE — Op Note (Signed)
NAME:  Russell [name], Russell [name]            ACCOUNT NO.:  [id number]  MEDICAL RECORD NO.:  [id number]  LOCATION:  2S01C                        FACILITY:  [facility]  PHYSICIAN:  Revonda Standard. Roxan Hockey, M.D.DATE OF BIRTH:  02-27-1946  DATE OF PROCEDURE:  02/23/2015 DATE OF DISCHARGE:                              OPERATIVE REPORT   PREOPERATIVE DIAGNOSIS:  Left lower lobe mass.  POSTOPERATIVE DIAGNOSIS:  Non-small cell carcinoma.  PROCEDURE:   Left video-assisted thoracoscopy Thoracoscopic left lower lobectomy Mediastinal lymph node dissection Cryoanalgesia of intercostal nerves.  SURGEON:  Revonda Standard. Roxan Hockey, M.D.  ASSISTANT:  Lars Pinks, PA-C  ANESTHESIA:  General.  FINDINGS:  A 3.5 to 4 cm mass in the posterior aspect left lower lobe.  No way to perform a wedge resection without leaving residual nonfunctional lung.  Therefore, left lower lobectomy performed. Multiple enlarged lymph nodes.  Frozen section revealed non-small cell carcinoma, bronchial margins negative for tumor.  CLINICAL NOTE:  Russell [name] is a 69 year old gentleman with a history of tobacco abuse.  He recently had a CT scan for workup of hematuria. He was noted to have a left lower lobe mass.  A CT of the chest was done and it showed a 3.5 cm spiculated mass in the left lower lobe.  There was mild hilar adenopathy.  There was a small nodule in the right upper lobe which was calcified.  A PET-CT showed the left lower lobe mass was hypermetabolic.  There was no evidence of regional or distant metastasis.  The patient was advised to undergo surgical resection.  The indications, risks, benefits, and alternatives were discussed in detail with the patient.  He understood and accepted the risks and agreed to proceed.  OPERATIVE NOTE:  Russell [name] was brought to the preoperative holding area on February 23, 2015.  Anesthesia Service placed a central line and an arterial blood pressure monitoring line.  He was taken  to the operating room, anesthetized, and intubated with a double-lumen endotracheal tube. Intravenous antibiotics were administered.  A Foley catheter was placed.  Sequential compressive devices were placed on the calves for DVT prophylaxis.  He was placed in a right lateral decubitus position. The left chest was prepped and draped in the usual sterile fashion. Single lung ventilation of the right lung was initiated and was tolerated well throughout the procedure.  An incision was made in the seventh intercostal space in the midaxillary line.  It was carried through the skin and subcutaneous tissue.  The chest was entered bluntly using a 5 mm port.  The thoracoscope was advanced into the chest.  There was good isolation of the left lung. There was no pleural effusion and no abnormality of the parietal pleura was noted.  A small utility incision was made in the fifth interspace anterolaterally.  The serratus fibers were split.  The intercostal muscles were divided with cautery.  No rib spreading was performed during the procedure.  Cryoanalgesia of the intercostal nerves then was performed beginning at the third interspace and progressing to the seventh interspace.  A 2 cm segment of the cryoprobe was placed over the nerve posterolaterally and was taken to -70 degrees Celsius for 2 minutes.  This process was repeated  at each interspace.  The fissure was inspected.  It was incomplete.  The mass was present in the lower lobe likely arising in the superior segment, but could not rule out involvement of the posterior basilar segment as well.  There was some dimpling of the visceral pleura.  This was highly suspicious for a lung cancer.  There was no way to perform a wedge resection incorporating the entire mass without likelihood of leaving residual nonfunctional lobe.  Therefore, the decision was made to proceed with a left lower lobectomy.  The inferior ligament was divided.  A small  node was identified and resected.  All lymph nodes that were encountered during the operation were resected and sent for pathology as separate specimens.  The pleural reflection was divided anteriorly and posteriorly at the hilum.  The inferior pulmonary vein was dissected out.  Adjacent nodes were resected.  The inferior vein was encircled and then divided with an endoscopic vascular stapler.  Next, attention was turned to the fissure.  An attempt was made to dissect out the pulmonary artery in the fissure.  However, the fissure was incomplete and there really was no clear plane in many areas.  The portion of the fissure between the lingula and the lower lobe was divided with an endoscopic stapler dissecting out from the hilum.  The lower lobe bronchus was clearly identifiable, but it took a long time to identify the pulmonary artery.  Multiple nodes had to be taken to identify the PA.  Once the pulmonary artery was identified, the dissection was carried out above the pulmonary artery dividing the incomplete fissure with sequential firings of an endoscopic GIA stapler.  There was a relatively large node between the bronchus and the lower lobe PA.  This was dissected out.  On the posterior aspect of the pulmonary artery, there was a large node between the basilar segmental branches and the superior segmental branch.  This node also was dissected out.  Once that was done, the superior segmental artery was encircled and divided with an endoscopic vascular stapler.  The basilar segmental branches now were able to be dissected out and encircled.  These were also divided with an endoscopic vascular stapler.  There were multiple enlarged nodes around the bronchus.  Some of these nodes had been taken during the dissection. The remainder were taken with the specimen.  The stapler was placed across the bronchus and closed, but not fired.  A test inflation of the upper lobe showed good aeration of  all segments of the upper lobe. The stapler then was fired transecting the bronchus.  The left lower lobe was placed into an endoscopic retrieval bag and removed through the working incision.  It was sent for frozen section of the mass and bronchus.  While awaiting the results of the frozen section, the pleura overlying the aortopulmonary window was incised.  A relatively large level 5 lymph node was encountered.  This was taken.  Next, the lung was retracted anteriorly.  The level 7 nodes were dissected out and again there was a relatively enlarged level 7 node as well as a smaller node that appeared more normal in size.  Surgicel was applied to these areas after removing the lymph nodes. The chest was copiously irrigated with warm saline.  Hemostasis was achieved.  A 28-French Blake drain was placed via the original port incision and directed posteriorly.  A 28-French chest tube was placed through a separate stab incision more anteriorly and directed anteriorly into  the apex.  Both were secured at the skin with #1 silk sutures.  A test inflation of the left upper lobe revealed some air leak along the fissure.  ProGEL was applied to this area.  The frozen section returned showing non-small cell carcinoma with a clear bronchial margin.  Dual lung ventilation was resumed.  The incision was closed with a running #1 Vicryl fascial suture followed by a 2-0 Vicryl subcutaneous suture and a 3-0 Vicryl subcuticular suture.  All sponge, needle, and instrument counts were correct at the end of the procedure.  The patient was taken from the operating room to the postanesthetic care unit, extubated and in good condition.     Revonda Standard Roxan Hockey, M.D.     SCH/MEDQ  D:  02/23/2015  T:  02/24/2015  Job:  [id number]

## 2015-02-24 NOTE — Progress Notes (Signed)
Patient ID: Russell [name], male   DOB: [birth year], 69 y.o.   MRN: [id number]  SICU Evening Rounds:  Hemodynamically stable on neo 20 mcg.  sats 97%  Urine output good  CT output low.

## 2015-02-24 NOTE — Clinical Documentation Improvement (Signed)
Possible Clinical Conditions?   Acute Blood Loss Anemia Acute on chronic blood loss anemia Expected Acute Blood Loss Anemia Precipitous drop in Hematocrit Other Condition Cannot Clinically Determine   Supporting Information: Risk Factors: (As per notes) VATS with postop bleeding  Signs and Symptoms: "He initially put out about 800 ml. The bleeding then slowed significantly and it appeared we would be able to avoid a return to the OR. However over the past 30 minutes his bleeding has increased dramatically (400 ml in 30 minutes) and we need to take him back for a re-exploration."  "700cc bloody drng quickly draining into pleuravac"   Diagnostics: Component     Latest Ref Rng 02/23/2015 02/23/2015 02/24/2015         5:36 PM  7:57 PM   WBC     4.0 - 10.5 K/uL 19.3 (H) 18.5 (H) 12.5 (H)  RBC     4.22 - 5.81 MIL/uL 3.92 (L) 3.32 (L) 3.68 (L)  Hemoglobin     13.0 - 17.0 g/dL 12.1 (L) 10.2 (L) 10.9 (L)  HCT     39.0 - 52.0 % 35.0 (L) 29.7 (L) 32.1 (L)   Thank You, Alessandra Grout, RN, BSN, CCDS,Clinical Documentation Specialist:  [phone]  [phone]=[facility]- Health Information Management

## 2015-02-24 NOTE — Progress Notes (Signed)
Utilization Review Completed.  

## 2015-02-25 ENCOUNTER — Inpatient Hospital Stay (HOSPITAL_COMMUNITY): Payer: Medicare Other

## 2015-02-25 LAB — GLUCOSE, CAPILLARY
GLUCOSE-CAPILLARY: 119 mg/dL — AB (ref 70–99)
GLUCOSE-CAPILLARY: 102 mg/dL — AB (ref 70–99)
Glucose-Capillary: 146 mg/dL — ABNORMAL HIGH (ref 70–99)
Glucose-Capillary: 113 mg/dL — ABNORMAL HIGH (ref 70–99)

## 2015-02-25 LAB — COMPREHENSIVE METABOLIC PANEL
ALBUMIN: 2.6 g/dL — AB (ref 3.5–5.2)
ALT: 17 U/L (ref 0–53)
AST: 17 U/L (ref 0–37)
Alkaline Phosphatase: 28 U/L — ABNORMAL LOW (ref 39–117)
Anion gap: 3 — ABNORMAL LOW (ref 5–15)
BUN: 8 mg/dL (ref 6–23)
CALCIUM: 7.4 mg/dL — AB (ref 8.4–10.5)
CHLORIDE: 109 mmol/L (ref 96–112)
CO2: 26 mmol/L (ref 19–32)
Creatinine, Ser: 0.84 mg/dL (ref 0.50–1.35)
GFR calc Af Amer: >90 mL/min (ref >90)
GFR calc non Af Amer: 87 mL/min — ABNORMAL LOW (ref >90)
Glucose, Bld: 166 mg/dL — ABNORMAL HIGH (ref 70–99)
Potassium: 3.1 mmol/L — ABNORMAL LOW (ref 3.5–5.1)
Sodium: 138 mmol/L (ref 135–145)
Total Bilirubin: 0.7 mg/dL (ref 0.3–1.2)
Total Protein: 4.4 g/dL — ABNORMAL LOW (ref 6.0–8.3)

## 2015-02-25 LAB — CBC
HEMATOCRIT: 28.5 % — AB (ref 39.0–52.0)
HEMOGLOBIN: 9.9 g/dL — AB (ref 13.0–17.0)
MCH: 30.4 pg (ref 26.0–34.0)
MCHC: 34.7 g/dL (ref 30.0–36.0)
MCV: 87.4 fL (ref 78.0–100.0)
Platelets: 133 10*3/uL — ABNORMAL LOW (ref 150–400)
RBC: 3.26 MIL/uL — ABNORMAL LOW (ref 4.22–5.81)
RDW: 13.3 % (ref 11.5–15.5)
WBC: 10.9 10*3/uL — AB (ref 4.0–10.5)

## 2015-02-25 LAB — POCT I-STAT 4, (NA,K, GLUC, HGB,HCT)
GLUCOSE: 126 mg/dL — AB (ref 70–99)
HEMATOCRIT: 27 % — AB (ref 39.0–52.0)
Hemoglobin: 9.2 g/dL — ABNORMAL LOW (ref 13.0–17.0)
POTASSIUM: 4 mmol/L (ref 3.5–5.1)
SODIUM: 139 mmol/L (ref 135–145)

## 2015-02-25 MED ORDER — ENOXAPARIN SODIUM 40 MG/0.4ML ~~LOC~~ SOLN
40.0000 mg | SUBCUTANEOUS | Status: DC
  Administered 2015-02-25 – 2015-02-27 (×3): 40 mg via SUBCUTANEOUS
  Filled 2015-02-25 (×4): qty 0.4

## 2015-02-25 MED ORDER — AMIODARONE HCL IN DEXTROSE 360-4.14 MG/200ML-% IV SOLN
60.0000 mg/h | INTRAVENOUS | Status: AC
  Administered 2015-02-25 (×2): 60 mg/h via INTRAVENOUS
  Filled 2015-02-25: qty 200

## 2015-02-25 MED ORDER — AMIODARONE LOAD VIA INFUSION
150.0000 mg | Freq: Once | INTRAVENOUS | Status: AC
  Administered 2015-02-25: 150 mg via INTRAVENOUS
  Filled 2015-02-25: qty 83.34

## 2015-02-25 MED ORDER — ATORVASTATIN CALCIUM 20 MG PO TABS
20.0000 mg | ORAL_TABLET | Freq: Every day | ORAL | Status: DC
  Administered 2015-02-25 – 2015-02-28 (×4): 20 mg via ORAL
  Filled 2015-02-25 (×5): qty 1

## 2015-02-25 MED ORDER — AMIODARONE HCL IN DEXTROSE 360-4.14 MG/200ML-% IV SOLN
30.0000 mg/h | INTRAVENOUS | Status: AC
  Administered 2015-02-25 – 2015-02-26 (×2): 30 mg/h via INTRAVENOUS
  Filled 2015-02-25 (×12): qty 200

## 2015-02-25 MED ORDER — POTASSIUM CHLORIDE 10 MEQ/50ML IV SOLN
10.0000 meq | INTRAVENOUS | Status: AC
  Administered 2015-02-25 (×5): 10 meq via INTRAVENOUS

## 2015-02-25 NOTE — Progress Notes (Signed)
2 Days Post-Op Procedure(s) (LRB): Video assisted thorocoscopy, exploration left chest for post operative bleeding. (Left) Subjective: Feels better today Pain less No nausea  Objective: Vital signs in last 24 hours: Temp:  [97.4 F (36.3 C)-98.8 F (37.1 C)] 97.4 F (36.3 C) (03/16 0721) Pulse Rate:  [61-130] 88 (03/16 0815) Cardiac Rhythm:  [-] Atrial fibrillation (03/16 0815) Resp:  [12-33] 12 (03/16 0815) BP: (79-132)/(32-71) 92/52 mmHg (03/16 0800) SpO2:  [86 %-100 %] 95 % (03/16 0815) Arterial Line BP: (60-146)/(34-87) 71/49 mmHg (03/16 0815)  Hemodynamic parameters for last 24 hours:    Intake/Output from previous day: 03/15 0701 - 03/16 0700 In: 4232 [P.O.:1080; I.V.:3102; IV Piggyback:50] Out: 0375 [Urine:3590; Chest Tube:720] Intake/Output this shift: Total I/O In: 112.1 [I.V.:112.1] Out: -   General appearance: alert and no distress Neurologic: intact Heart: irregularly irregular rhythm Lungs: diminished breath sounds bibasilar and rhonchi bilaterally Abdomen: soft , +BS no air leak, serous drainage from CT  Lab Results:  Recent Labs  02/24/15 0400 02/25/15 0430  WBC 12.5* 10.9*  HGB 10.9* 9.9*  HCT 32.1* 28.5*  PLT 153 133*   BMET:  Recent Labs  02/24/15 0400 02/25/15 0430  NA 137 138  K 4.5 3.1*  CL 107 109  CO2 27 26  GLUCOSE 129* 166*  BUN 17 8  CREATININE 0.94 0.84  CALCIUM 7.3* 7.4*    PT/INR:  Recent Labs  02/23/15 1957  LABPROT 16.9*  INR 1.36   ABG    Component Value Date/Time   PHART 7.314* 02/24/2015 0405   HCO3 24.3* 02/24/2015 0405   TCO2 26 02/24/2015 0405   ACIDBASEDEF 2.0 02/24/2015 0405   O2SAT 96.0 02/24/2015 0405   CBG (last 3)   Recent Labs  02/24/15 1757 02/24/15 2329 02/25/15 0557  GLUCAP 131* 119* 146*    Assessment/Plan: S/P Procedure(s) (LRB): Video assisted thorocoscopy, exploration left chest for post operative bleeding. (Left) POD # 2  CV- BP still relatively low, back on very low dose  neo this AM  Went into atrial fibrillation overnight  Currently in rate controlled a fib with controlled rate on amiodarone drip  RESP- no air leak- dc CT  Continue IS, bronchdilators  RENAL- creatinine OK  Hypokalemia- replete K  ENDO- CBG well controlled  DVT prophylaxis- SCD, ambulating well, I think it is safe to add enoxaparin at this point particularly in light of atrial fib  Anemia secondary to ABL- suspect the drop from yesterday is just re-equilibration  Mobilize- ambulated twice yesterday and has ambulated already today   LOS: 2 days    Melrose Nakayama 02/25/2015

## 2015-02-25 NOTE — Progress Notes (Signed)
Patient ID: Russell [name], male   DOB: [birth year], 69 y.o.   MRN: [id number] EVENING ROUNDS NOTE :     [address]       [place] [address]             [phone]                 2 Days Post-Op Procedure(s) (LRB): Video assisted thorocoscopy, exploration left chest for post operative bleeding. (Left)  Total Length of Stay:  LOS: 2 days  BP 122/51 mmHg  Pulse 80  Temp(Src) 97.7 F (36.5 C) (Oral)  Resp 16  Ht 6' (1.829 m)  Wt 193 lb 8 oz (87.771 kg)  BMI 26.24 kg/m2  SpO2 97%  .Intake/Output      03/15 0701 - 03/16 0700 03/16 0701 - 03/17 0700   P.O. 1080 600   I.V. (mL/kg) 3102 (35.3) 449.6 (5.1)   Blood     IV Piggyback 50 250   Total Intake(mL/kg) 4232 (48.2) 1299.6 (14.8)   Urine (mL/kg/hr) 3590 (1.7) 300 (0.3)   Blood     Chest Tube 720 (0.3)    Total Output 4310 300   Net -78 +999.6          . amiodarone 30 mg/hr (02/25/15 1600)  . dextrose 5 % and 0.9% NaCl 50 mL/hr at 02/25/15 0826  . phenylephrine (NEO-SYNEPHRINE) Adult infusion Stopped (02/25/15 1345)     Lab Results  Component Value Date   WBC 10.9* 02/25/2015   HGB 9.9* 02/25/2015   HCT 28.5* 02/25/2015   PLT 133* 02/25/2015   GLUCOSE 166* 02/25/2015   ALT 17 02/25/2015   AST 17 02/25/2015   NA 138 02/25/2015   K 3.1* 02/25/2015   CL 109 02/25/2015   CREATININE 0.84 02/25/2015   BUN 8 02/25/2015   CO2 26 02/25/2015   INR 1.36 02/23/2015   Stable day, no air leak 310 on bladder scan, foley out  Grace Isaac MD  Beeper [phone] Office [phone] 02/25/2015 5:01 PM

## 2015-02-26 ENCOUNTER — Inpatient Hospital Stay (HOSPITAL_COMMUNITY): Payer: Medicare Other

## 2015-02-26 LAB — BASIC METABOLIC PANEL
Anion gap: 5 (ref 5–15)
BUN: 10 mg/dL (ref 6–23)
CHLORIDE: 105 mmol/L (ref 96–112)
CO2: 27 mmol/L (ref 19–32)
Calcium: 8 mg/dL — ABNORMAL LOW (ref 8.4–10.5)
Creatinine, Ser: 0.81 mg/dL (ref 0.50–1.35)
GFR calc Af Amer: >90 mL/min (ref >90)
GFR, EST NON AFRICAN AMERICAN: 88 mL/min — AB (ref >90)
GLUCOSE: 147 mg/dL — AB (ref 70–99)
Potassium: 3.7 mmol/L (ref 3.5–5.1)
Sodium: 137 mmol/L (ref 135–145)

## 2015-02-26 LAB — CBC
HCT: 29.1 % — ABNORMAL LOW (ref 39.0–52.0)
Hemoglobin: 9.9 g/dL — ABNORMAL LOW (ref 13.0–17.0)
MCH: 30.1 pg (ref 26.0–34.0)
MCHC: 34 g/dL (ref 30.0–36.0)
MCV: 88.4 fL (ref 78.0–100.0)
Platelets: 151 10*3/uL (ref 150–400)
RBC: 3.29 MIL/uL — ABNORMAL LOW (ref 4.22–5.81)
RDW: 13.4 % (ref 11.5–15.5)
WBC: 11.6 10*3/uL — AB (ref 4.0–10.5)

## 2015-02-26 LAB — GLUCOSE, CAPILLARY
GLUCOSE-CAPILLARY: 142 mg/dL — AB (ref 70–99)
GLUCOSE-CAPILLARY: 110 mg/dL — AB (ref 70–99)
Glucose-Capillary: 125 mg/dL — ABNORMAL HIGH (ref 70–99)

## 2015-02-26 MED ORDER — INSULIN ASPART 100 UNIT/ML ~~LOC~~ SOLN
0.0000 [IU] | Freq: Three times a day (TID) | SUBCUTANEOUS | Status: DC
  Administered 2015-02-26: 2 [IU] via SUBCUTANEOUS

## 2015-02-26 MED ORDER — SODIUM CHLORIDE 0.9 % IV SOLN
INTRAVENOUS | Status: DC
  Administered 2015-02-26: 10:00:00 via INTRAVENOUS

## 2015-02-26 MED ORDER — POTASSIUM CHLORIDE CRYS ER 20 MEQ PO TBCR
20.0000 meq | EXTENDED_RELEASE_TABLET | Freq: Two times a day (BID) | ORAL | Status: AC
  Administered 2015-02-26 – 2015-02-28 (×6): 20 meq via ORAL
  Filled 2015-02-26 (×7): qty 1

## 2015-02-26 MED ORDER — AMIODARONE IV BOLUS ONLY 150 MG/100ML
150.0000 mg | Freq: Once | INTRAVENOUS | Status: AC
  Administered 2015-02-26: 150 mg via INTRAVENOUS

## 2015-02-26 MED ORDER — METOPROLOL TARTRATE 12.5 MG HALF TABLET
12.5000 mg | ORAL_TABLET | Freq: Two times a day (BID) | ORAL | Status: DC
  Administered 2015-02-26 – 2015-03-01 (×7): 12.5 mg via ORAL
  Filled 2015-02-26 (×8): qty 1

## 2015-02-26 MED ORDER — FUROSEMIDE 40 MG PO TABS
40.0000 mg | ORAL_TABLET | Freq: Every day | ORAL | Status: AC
  Administered 2015-02-26 – 2015-02-28 (×3): 40 mg via ORAL
  Filled 2015-02-26 (×3): qty 1

## 2015-02-26 NOTE — Progress Notes (Signed)
Patient ID: Russell [name], male   DOB: Jan 25, 1946, 69 y.o.   MRN: [id number]       I came by to speak to Russell [name] and spent approximately 10 minutes discussing his prostate biopsy results.      He has a T2a N0 M0 Gleason 8 prostate cancer on his recent biopsy with 10/12 cores positive including 2 with Gleason 8 and the remainder both 7(4+3) and 6.  His PSA was 5.5.     He had a CT and a PET that showed no evidence of prostate cancer mets.     He would generally need to be considered for either radical prostatectomy or combination EXRT and androgen ablation for management of the high risk prostate cancer, but the management of the lung cancer will need to take precedence at this time.       He should f/u with me in 2-3 weeks.

## 2015-02-26 NOTE — Care Management Note (Signed)
    Page 1 of 1   02/26/2015     2:43:55 PM CARE MANAGEMENT NOTE 02/26/2015  Patient:  Russell [name], Russell [name]   Account Number:  [id number]  Date Initiated:  02/26/2015  Documentation initiated by:  Cheryll Keisler  Subjective/Objective Assessment:   s/p VATS/LL Lobectomy; lives with spouse    PCP  Cari Caraway     Anticipated DC Date:  03/01/2015   Anticipated DC Plan:  [facility]  CM consult      Medicare Important Message given?  YES (If response is "NO", the following Medicare IM given date fields will be blank) Date Medicare IM given:  02/26/2015 Medicare IM given by:  Danzel Marszalek Date Additional Medicare IM given:   Additional Medicare IM given by:    Per UR Regulation:  Reviewed for med. necessity/level of care/duration of stay  Comments:  02/26/15 [place] to chair, states he feels much better today, has ambulated with staff.  On amiodarone gtt for Afib w/RVR.

## 2015-02-26 NOTE — Progress Notes (Signed)
3 Days Post-Op Procedure(s) (LRB): Video assisted thorocoscopy, exploration left chest for post operative bleeding. (Left) Subjective: Feels better today  Objective: Vital signs in last 24 hours: Temp:  [97.6 F (36.4 C)-98 F (36.7 C)] 97.9 F (36.6 C) (03/17 1133) Pulse Rate:  [73-123] 101 (03/17 1100) Cardiac Rhythm:  [-] Atrial fibrillation (03/17 1000) Resp:  [11-27] 14 (03/17 1100) BP: (92-142)/(43-104) 122/64 mmHg (03/17 1100) SpO2:  [93 %-100 %] 95 % (03/17 1100)  Hemodynamic parameters for last 24 hours:    Intake/Output from previous day: 03/16 0701 - 03/17 0700 In: 2680.1 [P.O.:980; I.V.:1450.1; IV Piggyback:250] Out: 2120 [Urine:1600; Chest Tube:520] Intake/Output this shift: Total I/O In: 190.1 [I.V.:190.1] Out: 300 [Urine:200; Chest Tube:100]  General appearance: alert, cooperative and no distress Neurologic: intact Heart: regular rate and rhythm Lungs: diminished breath sounds left base Abdomen: normal findings: soft, non-tender no air leak, serous drainage from CT  Lab Results:  Recent Labs  02/25/15 0430 02/25/15 1701 02/26/15 0500  WBC 10.9*  --  11.6*  HGB 9.9* 9.2* 9.9*  HCT 28.5* 27.0* 29.1*  PLT 133*  --  151   BMET:  Recent Labs  02/25/15 0430 02/25/15 1701 02/26/15 0500  NA 138 139 137  K 3.1* 4.0 3.7  CL 109  --  105  CO2 26  --  27  GLUCOSE 166* 126* 147*  BUN 8  --  10  CREATININE 0.84  --  0.81  CALCIUM 7.4*  --  8.0*    PT/INR:  Recent Labs  02/23/15 1957  LABPROT 16.9*  INR 1.36   ABG    Component Value Date/Time   PHART 7.314* 02/24/2015 0405   HCO3 24.3* 02/24/2015 0405   TCO2 26 02/24/2015 0405   ACIDBASEDEF 2.0 02/24/2015 0405   O2SAT 96.0 02/24/2015 0405   CBG (last 3)   Recent Labs  02/25/15 1306 02/25/15 1723 02/26/15 0507  GLUCAP 113* 102* 142*    Assessment/Plan: S/P Procedure(s) (LRB): Video assisted thorocoscopy, exploration left chest for post operative bleeding. (Left) Plan for  transfer to step-down: see transfer orders   POD # 3 thoracoscopic LLL, MLND, Re-exploration for bleeding  CV- in and out of a fib on amiodarone drip  Will rebolus amiodarone  His BP is better now- will start low dose metoprolol as well  RESP_ doing well with IS  Keep CT until < 250 ml per day  RENAL- creatinine OK  ENDO- CBG well controlled  DVT prophylaxis- SCD + enoxaparin   LOS: 3 days    Russell [name] 02/26/2015

## 2015-02-26 NOTE — Progress Notes (Signed)
  I have received the prostate path report from last week and he has a Gleason 8 prostate cancer.   I will try to get by and discuss with the patient and his wife this evening if possible.

## 2015-02-27 ENCOUNTER — Inpatient Hospital Stay (HOSPITAL_COMMUNITY): Payer: Medicare Other

## 2015-02-27 ENCOUNTER — Encounter (HOSPITAL_COMMUNITY): Payer: Self-pay | Admitting: *Deleted

## 2015-02-27 DIAGNOSIS — I4891 Unspecified atrial fibrillation: Secondary | ICD-10-CM

## 2015-02-27 DIAGNOSIS — I480 Paroxysmal atrial fibrillation: Secondary | ICD-10-CM

## 2015-02-27 DIAGNOSIS — I714 Abdominal aortic aneurysm, without rupture: Secondary | ICD-10-CM

## 2015-02-27 DIAGNOSIS — J984 Other disorders of lung: Secondary | ICD-10-CM

## 2015-02-27 LAB — TYPE AND SCREEN
ABO/RH(D): A POS
ANTIBODY SCREEN: NEGATIVE
UNIT DIVISION: 0
UNIT DIVISION: 0
Unit division: 0
Unit division: 0
Unit division: 0
Unit division: 0

## 2015-02-27 LAB — GLUCOSE, CAPILLARY
GLUCOSE-CAPILLARY: 106 mg/dL — AB (ref 70–99)
GLUCOSE-CAPILLARY: 112 mg/dL — AB (ref 70–99)
Glucose-Capillary: 107 mg/dL — ABNORMAL HIGH (ref 70–99)
Glucose-Capillary: 104 mg/dL — ABNORMAL HIGH (ref 70–99)

## 2015-02-27 LAB — BASIC METABOLIC PANEL
Anion gap: 4 — ABNORMAL LOW (ref 5–15)
BUN: 11 mg/dL (ref 6–23)
CHLORIDE: 103 mmol/L (ref 96–112)
CO2: 28 mmol/L (ref 19–32)
Calcium: 7.8 mg/dL — ABNORMAL LOW (ref 8.4–10.5)
Creatinine, Ser: 0.92 mg/dL (ref 0.50–1.35)
GFR calc Af Amer: >90 mL/min (ref >90)
GFR, EST NON AFRICAN AMERICAN: 83 mL/min — AB (ref >90)
GLUCOSE: 115 mg/dL — AB (ref 70–99)
Potassium: 3.7 mmol/L (ref 3.5–5.1)
SODIUM: 135 mmol/L (ref 135–145)

## 2015-02-27 MED ORDER — AMIODARONE HCL 200 MG PO TABS
400.0000 mg | ORAL_TABLET | Freq: Two times a day (BID) | ORAL | Status: DC
  Administered 2015-02-27 – 2015-03-01 (×5): 400 mg via ORAL
  Filled 2015-02-27 (×6): qty 2

## 2015-02-27 MED ORDER — POTASSIUM CHLORIDE 10 MEQ/50ML IV SOLN
10.0000 meq | INTRAVENOUS | Status: AC
  Administered 2015-02-27 (×3): 10 meq via INTRAVENOUS
  Filled 2015-02-27: qty 50

## 2015-02-27 MED ORDER — SODIUM CHLORIDE 0.9 % IJ SOLN
10.0000 mL | INTRAMUSCULAR | Status: DC | PRN
  Administered 2015-02-27: 20 mL
  Administered 2015-02-28: 30 mL
  Filled 2015-02-27: qty 40

## 2015-02-27 NOTE — Consult Note (Signed)
CARDIOLOGY CONSULT NOTE       Patient ID: Russell [name] MRN: [id number] DOB/AGE: 1946-03-18 69 y.o.  Admit date: 02/23/2015 Referring Physician:  Roxan Hockey Primary Physician: Cari Caraway, MD Primary Cardiologist:  New/Emri Sample Reason for Consultation: Afib  Active Problems:   Mass of lower lobe of left lung   HPI:  69 yo s/p left video assisted thoracotomy for lung cancer.  Brought back to OR for bleeding.  Chest tubes out now and Hct stable.  Also has prostate cancer being seen by Dr Jeffie Pollock.  Recent left distal Uretal stone removal and biopsy.  Did have microscopic hematuria.  Hct now 29.1.  Went into afib evening of 3/15 now over 48hrs.  CHADVASC score 3 ( HTN, vascular disease AAA 3.9 cm age) Started on  Amiodarone and beta blocker.  Asymptomatic with no dyspnea , chest pain or palpitations.  No previous history of arrhythmia or cardiac issues.  Chest tubes out with mild left apical residual pneumo and LLL Consolidation after hemothorax evacuation in OR.  Unable to perform wedge resection for 4cm LUL mass and nodes enlarged Will likely need adjuvant chemo/radiation and still may need prostatectomy for  Prostate CA.    ROS All other systems reviewed and negative except as noted above  Past Medical History  Diagnosis Date  . Hyperlipidemia   . Depression   . Hypertension   . AAA (abdominal aortic aneurysm)     INFRARENAL AAA , 4.4cm , per last CT 01-23-2015--  monitoted by dr early  . Mass of lower lobe of left lung   . Left ureteral calculus   . Bladder stone   . Elevated PSA   . Frequency of urination   . Nocturia   . BPH (benign prostatic hypertrophy)   . History of kidney stones   . Arthritis   . Wears glasses   . At risk for sleep apnea     STOP-BANG= 5        SENT TO PCP 02-17-2015  . Cancer   . Chronic kidney disease     kidney stones    Family History  Problem Relation Age of Onset  . Anuerysm Father     History   Social History  . Marital  Status: Married    Spouse Name: N/A  . Number of Children: N/A  . Years of Education: N/A   Occupational History  . Not on file.   Social History Main Topics  . Smoking status: Former Smoker -- 1.00 packs/day for 46 years    Types: Cigarettes    Quit date: 12/13/2007  . Smokeless tobacco: Never Used  . Alcohol Use: 1.2 oz/week    2 Cans of beer, 0 Standard drinks or equivalent per week  . Drug Use: No  . Sexual Activity: Not on file   Other Topics Concern  . Not on file   Social History Narrative    Past Surgical History  Procedure Laterality Date  . Cholecystectomy open  1992  . Cystoscopy/retrograde/ureteroscopy/stone extraction with basket Left 02/19/2015    Procedure: CYSTO WITH BLADDER STONE REMOVAL/LEFT URETEROSCOPY STONE EXTRACTION ;  Surgeon: Irine Seal, MD;  Location: [facility];  Service: Urology;  Laterality: Left;  . Prostate biopsy N/A 02/19/2015    Procedure: BIOPSY TRANSRECTAL ULTRASONIC PROSTATE (TUBP);  Surgeon: Irine Seal, MD;  Location: [facility];  Service: Urology;  Laterality: N/A;  . Video assisted thoracoscopy (vats)/wedge resection Left 02/23/2015    Procedure: LEFT VIDEO ASSISTED THORACOSCOPY ;  Surgeon: Melrose Nakayama, MD;  Location: [place];  Service: Thoracic;  Laterality: Left;  . Lobectomy Left 02/23/2015    Procedure:  LEFT LOWER LOBECTOMY WITH NODE DISSECTION;  Surgeon: Melrose Nakayama, MD;  Location: [place];  Service: Thoracic;  Laterality: Left;  . Lead removal Left 02/23/2015    Procedure: CRYO INTERCOSTAL NERVE BLOCK;  Surgeon: Melrose Nakayama, MD;  Location: [place];  Service: Thoracic;  Laterality: Left;  . Video assisted thoracoscopy (vats)/ lobectomy Left 02/23/2015    Procedure: Video assisted thorocoscopy, exploration left chest for post operative bleeding.;  Surgeon: Melrose Nakayama, MD;  Location: [place];  Service: Thoracic;  Laterality: Left;     . acetaminophen  1,000 mg Oral 4 times per  day   Or  . acetaminophen (TYLENOL) oral liquid 160 mg/5 mL  1,000 mg Oral 4 times per day  . amiodarone  400 mg Oral BID  . antiseptic oral rinse  7 mL Mouth Rinse BID  . aspirin EC  81 mg Oral Daily  . atorvastatin  20 mg Oral q1800  . bisacodyl  10 mg Oral Daily  . buPROPion  150 mg Oral BID  . enoxaparin (LOVENOX) injection  40 mg Subcutaneous Q24H  . fentaNYL   Intravenous 6 times per day  . FLUoxetine  20 mg Oral q morning - 10a  . furosemide  40 mg Oral Daily  . insulin aspart  0-15 Units Subcutaneous TID WC  . metoprolol tartrate  12.5 mg Oral BID  . omega-3 acid ethyl esters  1 g Oral Daily  . pantoprazole  40 mg Oral Daily  . potassium chloride  10 mEq Intravenous Q1 Hr x 3  . potassium chloride  20 mEq Oral BID  . senna-docusate  1 tablet Oral QHS   . sodium chloride 10 mL/hr at 02/26/15 1800  . amiodarone 30 mg/hr (02/26/15 1800)  . phenylephrine (NEO-SYNEPHRINE) Adult infusion Stopped (02/25/15 1345)    Physical Exam: Blood pressure 127/65, pulse 85, temperature 98.1 F (36.7 C), temperature source Oral, resp. rate 14, height 6' (1.829 m), weight 193 lb 8 oz (87.771 kg), SpO2 93 %.   Affect appropriate Pale chronically ill white male  HEENT: normal Neck supple with no adenopathy JVP normal no bruits no thyromegaly Lungs s/P left thoracotomy with dressing in place no wheezing and good diaphragmatic motion Heart:  S1/S2 no murmur, no rub, gallop or click PMI normal Abdomen: benighn, BS positve, no tenderness, palpable abdominal aorta no pain  no bruit.  No HSM or HJR Distal pulses intact with no bruits No edema Neuro non-focal Skin warm and dry No muscular weakness   Labs:   Lab Results  Component Value Date   WBC 11.6* 02/26/2015   HGB 9.9* 02/26/2015   HCT 29.1* 02/26/2015   MCV 88.4 02/26/2015   PLT 151 02/26/2015    Recent Labs Lab 02/25/15 0430  02/27/15 0415  NA 138  < > 135  K 3.1*  < > 3.7  CL 109  < > 103  CO2 26  < > 28  BUN 8  < >  11  CREATININE 0.84  < > 0.92  CALCIUM 7.4*  < > 7.8*  PROT 4.4*  --   --   BILITOT 0.7  --   --   ALKPHOS 28*  --   --   ALT 17  --   --   AST 17  --   --   GLUCOSE 166*  < >  115*  < > = values in this interval not displayed.    Radiology: Dg Chest 2 View  02/20/2015   CLINICAL DATA:  Left lung mass.  Preop chest x-ray.  EXAM: CHEST  2 VIEW  COMPARISON:  PET-CT 02/04/2015.CT 01/29/2015.  FINDINGS: Mediastinum and hilar structures normal. Left lung mass again noted and unchanged. No pleural effusion or pneumothorax. Stable mild cardiomegaly. No CHF. No acute bony abnormality.  IMPRESSION: Previously identified PET positive left lower lung mass is again noted and unchanged. No acute cardiopulmonary disease.   Electronically Signed   By: Marcello Moores  Register   On: 02/20/2015 16:47   Ct Chest W Contrast  01/29/2015   CLINICAL DATA:  Followup left lower lobe lung mass seen on recent CT scan of the abdomen/pelvis.  EXAM: CT CHEST WITH CONTRAST  TECHNIQUE: Multidetector CT imaging of the chest was performed during intravenous contrast administration.  CONTRAST:  58mL OMNIPAQUE IOHEXOL 300 MG/ML  SOLN  COMPARISON:  01/23/2015 abdominal CT scan.  FINDINGS: Chest wall: No chest wall mass, supraclavicular or axillary lymphadenopathy. The thyroid gland is grossly normal. A few tiny nodules are noted. The bony thorax is intact. No destructive bone lesions or spinal canal compromise. There are advanced degenerative changes involving the thoracic spine and possible changes of ankylosing spondylitis. Osteoporosis is noted along with scattered hemangiomas.  Mediastinum: The heart is normal in size. No pericardial effusion. The aorta demonstrates moderate scattered atherosclerotic calcifications but no focal aneurysm or dissection. The branch vessels are patent. Three-vessel coronary artery calcifications are noted. The esophagus is grossly normal. There is a small hiatal hernia. There are borderline enlarged left hilar  and mediastinal lymph nodes.  10 mm aorticopulmonary window node on image 22.  Small cluster of left paraesophageal lymph nodes on image number 32. The largest measures 11 mm.  16 mm left infrahilar lymph node.  Lungs/pleura: Again demonstrated is the spiculated 3.5 cm a left lower lobe pulmonary mass most concerning for primary lung neoplasm. There are underlying emphysematous changes and pulmonary scarring. Do not see any other worrisome pulmonary nodules. Apical scarring changes are noted on the right.  Upper abdomen: Simple appearing hepatic cysts. No upper abdominal mass or adenopathy.  IMPRESSION: 1. 3.5 cm left lower lobe pulmonary mass most consistent with primary lung neoplasm. There are borderline left infrahilar and left-sided mediastinal lymph nodes which are worrisome for metastatic disease. PET-CT may be helpful for further evaluation/staging. 2. Underlying emphysematous changes and pulmonary scarring. No findings for metastatic pulmonary nodules. 3. 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 No definite findings for metastatic bone disease.   Electronically Signed   By: Marijo Sanes M.D.   On: 01/29/2015 16:06   Nm Pet Image Initial (pi) Skull Base To Thigh  02/04/2015   CLINICAL DATA:  Initial treatment strategy for lung mass.  EXAM: NUCLEAR MEDICINE PET SKULL BASE TO THIGH  TECHNIQUE: 9.5 mCi F-18 FDG was injected intravenously. Full-ring PET imaging was performed from the skull base to thigh after the radiotracer. CT data was obtained and used for attenuation correction and anatomic localization.  FASTING BLOOD GLUCOSE:  Value: One hundred three mg/dl  COMPARISON:  CT chest 01/29/2015 and CT abdomen pelvis 01/23/2015.  FINDINGS: NECK  No hypermetabolic lymph nodes in the neck. CT images show no acute findings.  CHEST  A 3.4 x 4.0 cm spiculated mass in the superior segment left lower lobe has an SUV max of 15.3. No hypermetabolic mediastinal, hilar or  axillary  lymph nodes. CT images show no acute findings. Coronary artery calcification. Heart is mildly enlarged. No pericardial effusion. Centrilobular emphysema.  ABDOMEN/PELVIS  No abnormal hypermetabolism in the liver, adrenal glands, spleen or pancreas. No hypermetabolic lymph nodes. There is focal hypermetabolism in the low central portion of the prostate. CT images show a 1.5 cm low-attenuation lesion in the left hepatic lobe, unchanged from 04/12/2010, favoring a cyst. Cholecystectomy. Adrenal glands and right kidney are unremarkable. Left renal stones. A 9 mm stone is again seen in the distal left ureter, at the left ureterovesical junction. 10 mm stone is seen dependently in the bladder, stable. Spleen, pancreas, stomach and bowel are grossly unremarkable. No free fluid. Atherosclerotic calcification of the arterial vasculature. Infrarenal aortic aneurysm measures 4.3 cm.  SKELETON  No abnormal osseous hypermetabolism. Degenerative changes are seen in the spine.  IMPRESSION: 1. Hypermetabolic spiculated left lower lobe mass without evidence of metastatic disease. 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 2. Focal hypermetabolism in the low central prostate is nonspecific. Consider correlation with PSA, as clinically indicated. 3. Coronary artery calcification. 4. Left renal stones. Stones are again seen in the distal left ureter and bladder. 5. Infrarenal aortic aneurysm, as on 01/23/2015.   Electronically Signed   By: Lorin Picket M.D.   On: 02/04/2015 10:32   Dg Chest Port 1 View  02/27/2015   CLINICAL DATA:  Lobectomy  EXAM: PORTABLE CHEST - 1 VIEW  COMPARISON:  02/26/2015  FINDINGS: The right jugular central line extends into the cavoatrial junction. There is no change in the tiny left apical pneumothorax. Left base consolidation persists without significant change. Ground-glass central and medial base opacities in the right lung persist  unchanged.  IMPRESSION: No change in the tiny left apical pneumothorax. Consolidation in the left base and minor central ground-glass opacities persist without significant interval change.   Electronically Signed   By: Andreas Newport M.D.   On: 02/27/2015 06:11   Dg Chest Port 1 View  02/26/2015   CLINICAL DATA:  Status post left lower lobectomy and mediastinal dissection chest tube removal.  EXAM: PORTABLE CHEST - 1 VIEW  COMPARISON:  Single view of the chest 02/25/2015.  FINDINGS: Right IJ catheter remains in place. One of two left chest tubes has been removed. Tiny left pneumothorax, 5% or less, is unchanged. There has been some increase in left basilar opacity. Mild atelectasis in the right base is unchanged. There is no right pneumothorax. Cardiomegaly is noted.  IMPRESSION: But status post removal of 1 of 2 left chest tubes. No change in a very small left pneumothorax, less than 5%.  Increased left basilar opacity likely due to effusion and atelectasis.   Electronically Signed   By: Inge Rise M.D.   On: 02/26/2015 07:27   Dg Chest Port 1 View  02/25/2015   CLINICAL DATA:  69 year old male status post VATS and left lung lobectomy. Initial encounter.  EXAM: PORTABLE CHEST - 1 VIEW  COMPARISON:  02/24/2015 and earlier.  FINDINGS: Portable AP upright view at 0533 hours. 2 left chest tubes remain in place. Trace left apical pneumothorax is evident. Stable lung volumes. Stable cardiac size and mediastinal contours. Stable right IJ central line. Mild atelectasis at the right lung base.  IMPRESSION: 1.  Stable lines and tubes. 2. Trace left pneumothorax.  Two left chest tubes remain in place. 3. Right lung base atelectasis.   Electronically Signed   By: Genevie Ann  M.D.   On: 02/25/2015 07:37   Dg Chest Port 1 View  02/24/2015   CLINICAL DATA:  Pneumothorax followup  EXAM: PORTABLE CHEST - 1 VIEW  COMPARISON:  02/23/2015  FINDINGS: Two chest tubes remain on the left. No pneumothorax. Small left effusion  and left lower lobe atelectasis unchanged. Right lower lobe atelectasis slightly increased  Right jugular catheter tip at the cavoatrial junction.  IMPRESSION: Two chest tubes on the left without pneumothorax. Small left effusion unchanged.  Slight increase in right lower lobe atelectasis/ infiltrate. No change left lower lobe mild consolidation.   Electronically Signed   By: Franchot Gallo M.D.   On: 02/24/2015 07:39   Dg Chest Port 1 View  02/24/2015   CLINICAL DATA:  Pneumothorax.  Dyspnea.  EXAM: PORTABLE CHEST - 1 VIEW  COMPARISON:  02/23/2015  FINDINGS: Two left chest tubes remain. Right jugular central line extends to the cavoatrial junction. No pneumothorax is evident. Left base consolidation is slightly reduced, now with better aeration of the left base. Right lung remains clear.  IMPRESSION: No significant pneumothorax. Some improvement in aeration of the left base.   Electronically Signed   By: Andreas Newport M.D.   On: 02/24/2015 03:26   Dg Chest Port 1 View  02/23/2015   CLINICAL DATA:  Pneumothorax post surgery  EXAM: PORTABLE CHEST - 1 VIEW  COMPARISON:  Preoperative study of February 20, 2015  FINDINGS: The lungs are hypoinflated. A chest tube is in place on the left with the tip projecting over the medial aspect of the third rib posteriorly. A second chest tube has its tip in the adjacent location. There is no pneumothorax. There is no significant pleural effusion. Minimal basilar subsegmental atelectasis on the left is present. On the right the interstitial markings are coarse. There is a right internal jugular venous catheter present whose tip projects over the distal SVC. The cardiac silhouette is mildly enlarged. The pulmonary vascularity is not engorged.  IMPRESSION: Two left-sided chest tubes are in place. No pneumothorax or significant pleural effusion is demonstrated. The interstitial markings of both lungs are coarse which likely reflects mild interstitial edema. This appearance is  accentuated by bilateral hypoinflation.   Electronically Signed   By: David  Martinique   On: 02/23/2015 17:09    EKG:  SR rate 28 possible old IMI poor R wave progression   ASSESSMENT AND PLAN:  AFib:  Continue metoprolol and amiodarone Discussed with Dr Roxan Hockey  Given post op bleeding, anemia and ongoing need for Rx lung CA and prostate CA would like to use anticoagulation that is reversible I think best option is pradaxa as reversal agent available at [facility] and can be stopped 2 days before surgery rather than coumadin that would be days and require INR monitoring Will write For 150 bid starting in am if ok with CVTS.  Will follow as outpatient.  Echo for EF and atrial sizes Lung CA:  Will need oncology f/u post thoracotomy bleeding seems to be under control FESO4 for anemia follow CXR  Currently not on antibiotics LLL consolidation post hemothorax evacuation Prostate CA:  F/u Dr Cecille Amsterdam has been removed so hopefully won't be issues with hematuria AAA:  Outpatient duplex 6 months  3.9 cm  Signed: Jenkins Rouge 02/27/2015, 10:42 AM

## 2015-02-27 NOTE — Progress Notes (Signed)
      [address]       [place] [address]             [phone]      Appreciate Dr. Kyla Balzarine assistance  I think he is safe to anticoagulate as indicated at this point  [place]. Roxan Hockey, MD [facility] [phone] \

## 2015-02-27 NOTE — Discharge Summary (Signed)
Physician Discharge Summary       [address]       [place] [address]             [phone]    Patient ID: Russell [name] MRN: [id number] DOB/AGE: Mar 23, 1946 69 y.o.  Admit date: 02/23/2015 Discharge date: 03/01/2015  Admission Diagnoses: 1. Left lower lobe mass 2. History of AAA (abdominal aortic aneurysm) 3. History of hyperlipidemia 4. History of tobacco abuse  Discharge Diagnoses:  1. Non small cell carcinoma LLL 2. History of AAA (abdominal aortic aneurysm) 3. History of hyperlipidemia 4. History of tobacco abuse 5. ABL anemia  Consults: cardiology  Procedure (s):  1. Left video-assisted thoracoscopy, thoracoscopic left lower lobectomy with mediastinal lymph node dissection, cryoanalgesia of intercostal nerves by Dr. Roxan Hockey on 02/24/2015. 2. Left video-assisted thoracoscopy for re-exploration for bleeding, evacuation of hemothorax controlled multiple bleeding sites by Dr. Roxan Hockey on 02/24/2015.  Pathology:  1. Lung, resection (segmental or lobe), left lower lobe - INVASIVE POORLY DIFFERENTIATED NON-SMALL CELL CARCINOMA WITH SQUAMOUS AND ADENOCARCINOMA DIFFERENTIATION, SPANNING 6 CM IN GREATEST DIMENSION. - TUMOR INVADES VISCERAL PLEURA. - MARGINS ARE NEGATIVE. - ONE OF TWO HILAR LYMPH NODES IS POSITIVE FOR METASTATIC CARCINOMA (1/2). - SEE ONCOLOGY TEMPLATE. 2. Lymph node, biopsy, 10L - ONE BENIGN LYMPH NODE WITH NO TUMOR SEEN (0/1). 3. Lymph node, biopsy, 11L - ONE BENIGN LYMPH NODE WITH NO TUMOR SEEN (0/1). 4. Lymph node, biopsy, 11L #2 - ONE BENIGN LYMPH NODE WITH NO TUMOR SEEN (0/1). 5. Lymph node, biopsy, 11L #3 - ONE BENIGN LYMPH NODE WITH NO TUMOR SEEN (0/1). 6. Lymph node, biopsy, 11L #4 - ONE BENIGN LYMPH NODE WITH NO TUMOR SEEN (0/1). 7. Lymph node, biopsy, 12L #1 - ONE LYMPH NODE POSITIVE FOR METASTATIC NON-SMALL CELL CARCINOMA (1/1). - SEE COMMENT. 8. Lymph node, biopsy, 11L #5 - ONE BENIGN LYMPH NODE WITH NO  TUMOR SEEN (0/1). 9. Lymph node, biopsy, 9L #1 - ONE BENIGN LYMPH NODE WITH NO TUMOR SEEN (0/1). 10. Lymph node, biopsy, 5 #1 - ONE BENIGN LYMPH NODE WITH NO TUMOR SEEN (0/1). TNM code: pT2b, pN2.  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 He has a history of kidney stones and hematuria. He recently had recurrent hematuria. As part of his workup he had a CT of the abdomen and pelvis. A left lower lobe mass was noted on the CT scan. He then had a CT of the chest which showed a 3.5 cm spiculated mass in the left lower lobe. There was some mild hilar adenopathy. There was pulmonary fibrosis noted. A small nodule in the right upper lobe showed calcification. He then had a PET/CT which showed the left lower lobe mass was hypermetabolic. The right upper lobe nodule was not hypermetabolic. There was no evidence of regional or distant metastases.  He says that he walks on a daily basis. He can walk about a mile without any shortness of breath chest pain, pressure, or tightness. He can walk up and down stairs without difficulty. He denies cough, hemoptysis, wheezing, shortness of breath, peripheral edema. His weight is unchanged over the past 3 in 6 months.  I recommended that we proceed directly to left VATS, wedge resection, possible left lower lobectomy. I described the general nature of the procedure to him including the need for general anesthesia, the incisions to be used, the intraoperative  decision making, the expected hospital stay, and overall recovery. I reviewed the indications, risks, benefits, and alternatives. He understands the risk include, but are not limited to death, MI, DVT, PE, bleeding, possible need for transfusion, infection, prolonged air leak, cardiac arrhythmias, as well as possibility of unforeseeable competitions. We also discussed the possibility of  cryo-analgesia intercostal nerves to assist with postoperative pain management. He does wish to have that procedure done as well. He underwent the aforementioned procedure on 02/23/2015.  Brief Hospital Course:  He had to undergo re exploration of left chest (via left VATS) for post operative bleeding. He has remained afebrile and hemodynamically stable. Chest tube output gradually decreased. Daily chest x rays remained stable. All chest tubes were removed by 02/27/2015. He did go into paroxysmal a fib. He was put on an Amiodarone drip and cardiology was consulted. He remains in a fib with a controlled ventricular rate. He is on Amiodarone 400 bid and Lopressor 12.5 bid. He was also put on Pradaxa . He was felt surgically stable for transfer from the ICU to 2000 for further convalescence on 02/27/2015. He is ambulating on room air. He is tolerating a diet and has had a bowel movement. His incision is clean and dry and continuing to heal. He remains in a fib with a controlled ventricular rate.He is felt surgically stable for discharge today.  Latest Vital Signs: Blood pressure 125/65, pulse 88, temperature 99.1 F (37.3 C), temperature source Oral, resp. rate 18, height 6' (1.829 m), weight 190 lb 14.7 oz (86.6 kg), SpO2 95 %.  Physical Exam: Cardiovascular: IRRR IRRR. Pulmonary: Clear to auscultation on right and diminished left base; no rales, wheezes, or rhonchi. Abdomen: Soft, non tender, bowel sounds present. Extremities: No ower extremity edema. Wounds: Clean and dry. No erythema or signs of infection. There is serous drainage from chest tube site but no sign of infection.   Discharge Condition:Stable and discharged to home  Recent laboratory studies:  Lab Results  Component Value Date   WBC 10.8* 02/28/2015   HGB 9.8* 02/28/2015   HCT 29.1* 02/28/2015   MCV 89.0 02/28/2015   PLT 237 02/28/2015   Lab Results  Component Value Date   NA 135 02/27/2015   K 3.7 02/27/2015   CL 103  02/27/2015   CO2 28 02/27/2015   CREATININE 0.92 02/27/2015   GLUCOSE 115* 02/27/2015      Diagnostic Studies: Ct Chest W Contrast  01/29/2015   CLINICAL DATA:  Followup left lower lobe lung mass seen on recent CT scan of the abdomen/pelvis.  EXAM: CT CHEST WITH CONTRAST  TECHNIQUE: Multidetector CT imaging of the chest was performed during intravenous contrast administration.  CONTRAST:  62mL OMNIPAQUE IOHEXOL 300 MG/ML  SOLN  COMPARISON:  01/23/2015 abdominal CT scan.  FINDINGS: Chest wall: No chest wall mass, supraclavicular or axillary lymphadenopathy. The thyroid gland is grossly normal. A few tiny nodules are noted. The bony thorax is intact. No destructive bone lesions or spinal canal compromise. There are advanced degenerative changes involving the thoracic spine and possible changes of ankylosing spondylitis. Osteoporosis is noted along with scattered hemangiomas.  Mediastinum: The heart is normal in size. No pericardial effusion. The aorta demonstrates moderate scattered atherosclerotic calcifications but no focal aneurysm or dissection. The branch vessels are patent. Three-vessel coronary artery calcifications are noted. The esophagus is grossly normal. There is a small hiatal hernia. There are borderline enlarged left hilar and mediastinal lymph nodes.  10 mm aorticopulmonary window node on image 22.  Small cluster of left paraesophageal lymph nodes on image number 32. The largest measures 11 mm.  16 mm left infrahilar lymph node.  Lungs/pleura: Again demonstrated is the spiculated 3.5 cm a left lower lobe pulmonary mass most concerning for primary lung neoplasm. There are underlying emphysematous changes and pulmonary scarring. Do not see any other worrisome pulmonary nodules. Apical scarring changes are noted on the right.  Upper abdomen: Simple appearing hepatic cysts. No upper abdominal mass or adenopathy.  IMPRESSION: 1. 3.5 cm left lower lobe pulmonary mass most consistent with primary lung  neoplasm. There are borderline left infrahilar and left-sided mediastinal lymph nodes which are worrisome for metastatic disease. PET-CT may be helpful for further evaluation/staging. 2. Underlying emphysematous changes and pulmonary scarring. No findings for metastatic pulmonary nodules. 3. 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 No definite findings for metastatic bone disease.   Electronically Signed   By: Marijo Sanes M.D.   On: 01/29/2015 16:06   Nm Pet Image Initial (pi) Skull Base To Thigh  02/04/2015   CLINICAL DATA:  Initial treatment strategy for lung mass.  EXAM: NUCLEAR MEDICINE PET SKULL BASE TO THIGH  TECHNIQUE: 9.5 mCi F-18 FDG was injected intravenously. Full-ring PET imaging was performed from the skull base to thigh after the radiotracer. CT data was obtained and used for attenuation correction and anatomic localization.  FASTING BLOOD GLUCOSE:  Value: One hundred three mg/dl  COMPARISON:  CT chest 01/29/2015 and CT abdomen pelvis 01/23/2015.  FINDINGS: NECK  No hypermetabolic lymph nodes in the neck. CT images show no acute findings.  CHEST  A 3.4 x 4.0 cm spiculated mass in the superior segment left lower lobe has an SUV max of 15.3. No hypermetabolic mediastinal, hilar or axillary lymph nodes. CT images show no acute findings. Coronary artery calcification. Heart is mildly enlarged. No pericardial effusion. Centrilobular emphysema.  ABDOMEN/PELVIS  No abnormal hypermetabolism in the liver, adrenal glands, spleen or pancreas. No hypermetabolic lymph nodes. There is focal hypermetabolism in the low central portion of the prostate. CT images show a 1.5 cm low-attenuation lesion in the left hepatic lobe, unchanged from 04/12/2010, favoring a cyst. Cholecystectomy. Adrenal glands and right kidney are unremarkable. Left renal stones. A 9 mm stone is again seen in the distal left ureter, at the left ureterovesical junction. 10 mm stone is seen  dependently in the bladder, stable. Spleen, pancreas, stomach and bowel are grossly unremarkable. No free fluid. Atherosclerotic calcification of the arterial vasculature. Infrarenal aortic aneurysm measures 4.3 cm.  SKELETON  No abnormal osseous hypermetabolism. Degenerative changes are seen in the spine.  IMPRESSION: 1. Hypermetabolic spiculated left lower lobe mass without evidence of metastatic disease. 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 2. Focal hypermetabolism in the low central prostate is nonspecific. Consider correlation with PSA, as clinically indicated. 3. Coronary artery calcification. 4. Left renal stones. Stones are again seen in the distal left ureter and bladder. 5. Infrarenal aortic aneurysm, as on 01/23/2015.   Electronically Signed   By: Lorin Picket M.D.   On: 02/04/2015 10:32   2. EXAM: CHEST 2 VIEW  COMPARISON: 02/28/2015  FINDINGS: The left apical pneumothorax seen previously is again identified and stable. Small left-sided pleural effusion is noted as well as infiltrative changes in the left lung base. Patchy right mid lung infiltrate is again noted. The right-sided central venous line is in satisfactory position.  IMPRESSION: Overall stable appearance of the  chest. A small left apical pneumothorax is again noted.   Electronically Signed  By: Inez Catalina M.D.  On: 03/01/2015 08:17   Discharge Medications:   Medication List    STOP taking these medications        HYDROcodone-acetaminophen 5-325 MG per tablet  Commonly known as:  NORCO     losartan 50 MG tablet  Commonly known as:  COZAAR     simvastatin 40 MG tablet  Commonly known as:  ZOCOR      TAKE these medications        amiodarone 400 MG tablet  Commonly known as:  PACERONE  Take 1 tablet (400 mg total) by mouth 2 (two) times daily. For 5 days;then take Amiodarone 200 mg by mouth two times daily for one week;then take  Amiodarone 200 mg by mouth daily thereafter     aspirin 81 MG tablet  Take 81 mg by mouth daily.     atorvastatin 20 MG tablet  Commonly known as:  LIPITOR  Take 1 tablet (20 mg total) by mouth daily at 6 PM.     buPROPion 75 MG tablet  Commonly known as:  WELLBUTRIN  Take 150 mg by mouth 2 (two) times daily.     dabigatran 150 MG Caps capsule  Commonly known as:  PRADAXA  Take 1 capsule (150 mg total) by mouth every 12 (twelve) hours.     Fish Oil 1000 MG Caps  Take 1,000 mg by mouth daily.     FLUoxetine 20 MG capsule  Commonly known as:  PROZAC  Take 20 mg by mouth every morning.     GLUCOSAMINE PO  Take by mouth daily.     metoprolol tartrate 25 MG tablet  Commonly known as:  LOPRESSOR  Take 0.5 tablets (12.5 mg total) by mouth 2 (two) times daily.     oxyCODONE 5 MG immediate release tablet  Commonly known as:  Oxy IR/ROXICODONE  Take 1-2 tablets (5-10 mg total) by mouth every 3 (three) hours as needed for severe pain.        Follow Up Appointments: Follow-up Information    Follow up with Malka So, MD.   Specialty:  Urology   Why:  Call for a follow up for 2-3 weeks post discharge.    Contact information:   [place] [address] [phone]       Follow up with Melrose Nakayama, MD On 03/24/2015.   Specialty:  Cardiothoracic Surgery   Why:  PA/LAT CXR to be taken (at [facility] which is in the same building as Dr. Leonarda Salon office) on 03/24/2015 at 12:30 pm;Appointment time is  1:30 pm   Contact information:   [address] [place] [address] [phone]       Follow up with Jenkins Rouge, MD.   Specialty:  Cardiology   Why:  Call for an appointment for 2 weeks   Contact information:   [address] [address] [phone]       Follow up with Nurse.   Why:  Appointment is with nurse only to have chest tube sutures removed on 10:15 am   Contact information:   [address] [place] [address] [phone]      Signed: Lars Pinks MPA-C 03/01/2015, 10:26 AM

## 2015-02-27 NOTE — Progress Notes (Signed)
   I spoke with Mrs. Kobashigawa over the phone today and reviewed his prostate pathology.   The lung path was not back when Dr. Roxan Hockey was by this morning but it was this afternoon so I shared that result with her as well as she wanted to have it for the weekend if possible to discuss with a physician friend who she felt could translate the information for her better.  With his NSCLC with positive nodes, the prostate cancer even though it is high risk will need to take a back seat.  I don't think I would recommend a radical prostatectomy in the face of the metastatic lung CA but would be more inclined toward radiation therapy with adjuvant androgen ablation, but I will defer further decision making until he has been given a treatment plan for the lung cancer and sees me in f/u.

## 2015-02-27 NOTE — Progress Notes (Signed)
Wasted 26cc PCA fent. Witnessed by second RN [employer]. Flushed down sink

## 2015-02-27 NOTE — Progress Notes (Signed)
4 Days Post-Op Procedure(s) (LRB): Video assisted thorocoscopy, exploration left chest for post operative bleeding. (Left) Subjective: Some incisional pain Ambulated 4 x yesterday  Objective: Vital signs in last 24 hours: Temp:  [97.8 F (36.6 C)-98.5 F (36.9 C)] 98.1 F (36.7 C) (03/18 0723) Pulse Rate:  [72-121] 75 (03/18 0400) Cardiac Rhythm:  [-] Atrial fibrillation (03/18 0400) Resp:  [11-21] 14 (03/18 0400) BP: (93-131)/(31-95) 106/56 mmHg (03/18 0400) SpO2:  [90 %-98 %] 97 % (03/18 0400)  Hemodynamic parameters for last 24 hours:    Intake/Output from previous day: 03/17 0701 - 03/18 0700 In: 1418.6 [P.O.:720; I.V.:648.6; IV Piggyback:50] Out: 2115 [Urine:1765; Chest Tube:350] Intake/Output this shift:    General appearance: alert and no distress Neurologic: intact Heart: irregularly irregular rhythm Lungs: diminished breath sounds left base Wound: clean and dry serous drainage from CT  Lab Results:  Recent Labs  02/25/15 0430 02/25/15 1701 02/26/15 0500  WBC 10.9*  --  11.6*  HGB 9.9* 9.2* 9.9*  HCT 28.5* 27.0* 29.1*  PLT 133*  --  151   BMET:  Recent Labs  02/26/15 0500 02/27/15 0415  NA 137 135  K 3.7 3.7  CL 105 103  CO2 27 28  GLUCOSE 147* 115*  BUN 10 11  CREATININE 0.81 0.92  CALCIUM 8.0* 7.8*    PT/INR: No results for input(s): LABPROT, INR in the last 72 hours. ABG    Component Value Date/Time   PHART 7.314* 02/24/2015 0405   HCO3 24.3* 02/24/2015 0405   TCO2 26 02/24/2015 0405   ACIDBASEDEF 2.0 02/24/2015 0405   O2SAT 96.0 02/24/2015 0405   CBG (last 3)   Recent Labs  02/26/15 1131 02/26/15 1813 02/26/15 2211  GLUCAP 125* 110* 107*    Assessment/Plan: S/P Procedure(s) (LRB): Video assisted thorocoscopy, exploration left chest for post operative bleeding. (Left) Plan for transfer to step-down: see transfer orders   Overall doing well  CV- still in a fib, rate controlled with amiodarone and low dose  metoprolol  Probably will need anticoagulation  Will consult Cardiology  RESP- no air leak, CXR OK, moderate drainage from CT- dc CT  RENAL_ supplement K  ENDO- CBG well controlled  On SCD + enoxaparin for DVT prophylaxis  Doing well with ambulation  LOS: 4 days    Melrose Nakayama 02/27/2015

## 2015-02-28 ENCOUNTER — Inpatient Hospital Stay (HOSPITAL_COMMUNITY): Payer: Medicare Other

## 2015-02-28 LAB — CBC
HEMATOCRIT: 29.1 % — AB (ref 39.0–52.0)
Hemoglobin: 9.8 g/dL — ABNORMAL LOW (ref 13.0–17.0)
MCH: 30 pg (ref 26.0–34.0)
MCHC: 33.7 g/dL (ref 30.0–36.0)
MCV: 89 fL (ref 78.0–100.0)
PLATELETS: 237 10*3/uL (ref 150–400)
RBC: 3.27 MIL/uL — AB (ref 4.22–5.81)
RDW: 13.2 % (ref 11.5–15.5)
WBC: 10.8 10*3/uL — AB (ref 4.0–10.5)

## 2015-02-28 LAB — GLUCOSE, CAPILLARY
GLUCOSE-CAPILLARY: 101 mg/dL — AB (ref 70–99)
GLUCOSE-CAPILLARY: 125 mg/dL — AB (ref 70–99)
Glucose-Capillary: 105 mg/dL — ABNORMAL HIGH (ref 70–99)
Glucose-Capillary: 112 mg/dL — ABNORMAL HIGH (ref 70–99)

## 2015-02-28 MED ORDER — DABIGATRAN ETEXILATE MESYLATE 150 MG PO CAPS
150.0000 mg | ORAL_CAPSULE | Freq: Two times a day (BID) | ORAL | Status: DC
  Administered 2015-02-28 – 2015-03-01 (×3): 150 mg via ORAL
  Filled 2015-02-28 (×4): qty 1

## 2015-02-28 MED ORDER — LACTULOSE 10 GM/15ML PO SOLN
20.0000 g | Freq: Once | ORAL | Status: DC
  Filled 2015-02-28: qty 30

## 2015-02-28 NOTE — Progress Notes (Addendum)
ANTICOAGULATION CONSULT NOTE - Initial Consult  Pharmacy Consult for Pradaxa  Indication: Non-valvular atrial fibrillation   No Known Allergies  Patient Measurements: Height: 6' (182.9 cm) Weight: 194 lb 3.6 oz (88.1 kg) IBW/kg (Calculated) : 77.6  Vital Signs: Temp: 98.2 F (36.8 C) (03/19 0614) Temp Source: Oral (03/19 0614) BP: 123/63 mmHg (03/19 0614) Pulse Rate: 86 (03/19 0614)  Labs:  Recent Labs  02/25/15 1701 02/26/15 0500 02/27/15 0415  HGB 9.2* 9.9*  --   HCT 27.0* 29.1*  --   PLT  --  151  --   CREATININE  --  0.81 0.92    Estimated Creatinine Clearance: 82 mL/min (by C-G formula based on Cr of 0.92).  Assessment: Pradaxa for afib, cardiology requests Pradaxa in this patient due to reversibility as pt has had some bleeding issues post-op. Noted DDI with Amiodarone, pt's renal function appears ok.   Goal of Therapy:  Monitor platelets by anticoagulation protocol: Yes   Plan:  -Start Pradaxa 150 mg BID -Minimum q72h CBC while inpatient -Monitor closely for bleeding  Narda Bonds 02/28/2015,7:44 AM

## 2015-02-28 NOTE — Discharge Instructions (Addendum)
Thoracoscopy Care After Refer to this sheet in the next few weeks. These discharge instructions provide you with general information on caring for yourself after you leave the hospital. Your caregiver may also give you specific instructions. Your treatment has been planned according to the most current medical practices available, but unavoidable complications sometimes occur. If you have any problems or questions after discharge, call your caregiver. HOME CARE INSTRUCTIONS   Remove the bandage (dressing) over your chest tube site as directed by your caregiver.  It is normal to be sore for a couple weeks following surgery. See your caregiver if this seems to be getting worse rather than better.  Only take over-the-counter or prescription medicines for pain, discomfort, or fever as directed by your caregiver. It is very important to take pain medicine when you need it so that you will cough and breathe deeply enough to clear mucus (phlegm) and expand your lungs.  If it hurts to cough, hold a pillow against your chest when you cough. This may help with the discomfort. In spite of the discomfort, cough frequently, as this helps protect against getting an infection in your lung (pneumonia).  Taking deep breaths keeps lungs inflated and protects against pneumonia. Most patients will go home with an incentive spirometer that encourages deep breathing.  You may resume a normal diet and activities as directed.  Use showers for bathing until you see your caregiver, or as instructed.  Change dressings if necessary or as directed.  Avoid lifting or driving until you are instructed otherwise.  Make an appointment to see your caregiver for stitch (suture) or staple removal when instructed.  Do not travel by airplane for 2 weeks after the chest tube is removed. SEEK MEDICAL CARE IF:   You are bleeding from your wounds.  You have redness, swelling, or increasing pain in the wounds.  Your heartbeat  feels irregular or very fast.  There is pus coming from your wounds.  There is a bad smell coming from the wound or dressing. SEEK IMMEDIATE MEDICAL CARE IF:   You have a fever.  You develop a rash.  You have difficulty breathing.  You develop any reaction or side effects to medicines given.  You develop lightheadedness or feel faint.  You develop shortness of breath or chest pain. MAKE SURE YOU:   Understand these instructions.  Will watch your condition.  Will get help right away if you are not doing well or get worse. Document Released: 06/17/2005 Document Revised: 02/20/2012 Document Reviewed: 05/18/2011 [facility] Patient Information 2015 [place], [place]. This information is not intended to replace advice given to you by your health care provider. Make sure you discuss any questions you have with your health care provider.  ACTIVITY:  1.Increase activity slowly. 2.Walk daily and increase frequency and duration as tolerates. 3.May walk up steps. 4.No lifting more than ten pounds for two weeks. 5.No driving for two weeks. 6.Avoid straining. 7.STOP any activity that causes chest pain, shortness of breath, dizziness,sweating,     or excessive weakness. 8.Continue with breathing exercises daily.  DIET:  Low fat, Low cholesterol diet   WOUND:  1.May shower. 2.Clean wounds with mild soap and water.  Call the office at [phone] if any  problems arise. 3. Apply dry gauze with tape to draining chest tube site and change daily. If develops redness, increased drainage, fever, or chills, call office ASAP.  Information on my medicine - Pradaxa (dabigatran)  This medication education was reviewed with me or my  healthcare representative as part of my discharge preparation.  The pharmacist that spoke with me during my hospital stay was:  Deboraha Sprang, [facility]  Why was Pradaxa prescribed for you? Pradaxa was prescribed for you to reduce the risk of forming blood  clots that cause a stroke if you have a medical condition called atrial fibrillation (a type of irregular heartbeat).    What do you Need to know about PradAXa? Take your Pradaxa TWICE DAILY - one capsule in the morning and one tablet in the evening with or without food.  It would be best to take the doses about the same time each day.  The capsules should not be broken, chewed or opened - they must be swallowed whole.  Do not store Pradaxa in other medication containers - once the bottle is opened the Pradaxa should be used within FOUR months; throw away any capsules that havent been by that time.  Take Pradaxa exactly as prescribed by your doctor.  DO NOT stop taking Pradaxa without talking to the doctor who prescribed the medication.  Stopping without other stroke prevention medication to take the place of Pradaxa may increase your risk of developing a clot that causes a stroke.  Refill your prescription before you run out.  After discharge, you should have regular check-up appointments with your healthcare provider that is prescribing your Pradaxa.  In the future your dose may need to be changed if your kidney function or weight changes by a significant amount.  What do you do if you miss a dose? If you miss a dose, take it as soon as you remember on the same day.  If your next dose is less than 6 hours away, skip the missed dose.  Do not take two doses of PRADAXA at the same time.  Important Safety Information A possible side effect of Pradaxa is bleeding. You should call your healthcare provider right away if you experience any of the following: ? Bleeding from an injury or your nose that does not stop. ? Unusual colored urine (red or dark brown) or unusual colored stools (red or black). ? Unusual bruising for unknown reasons. ? A serious fall or if you hit your head (even if there is no bleeding).  Some medicines may interact with Pradaxa and might increase your risk of bleeding  or clotting while on Pradaxa. To help avoid this, consult your healthcare provider or pharmacist prior to using any new prescription or non-prescription medications, including herbals, vitamins, non-steroidal anti-inflammatory drugs (NSAIDs) and supplements.  This website has more information on Pradaxa (dabigatran): [email or web address].com

## 2015-02-28 NOTE — Progress Notes (Addendum)
      [address]       [place], [address]             [phone]       5 Days Post-Op Procedure(s) (LRB): Video assisted thorocoscopy, exploration left chest for post operative bleeding. (Left)  Subjective: Patient with occasional cough and constipation.  Objective: Vital signs in last 24 hours: Temp:  [98.1 F (36.7 C)-98.6 F (37 C)] 98.2 F (36.8 C) (03/19 0614) Pulse Rate:  [70-89] 86 (03/19 0614) Cardiac Rhythm:  [-] Atrial fibrillation (03/18 1945) Resp:  [10-22] 18 (03/19 0614) BP: (109-142)/(56-68) 123/63 mmHg (03/19 0614) SpO2:  [93 %-99 %] 94 % (03/19 0614) Weight:  [194 lb 3.6 oz (88.1 kg)-196 lb 11.2 oz (89.223 kg)] 194 lb 3.6 oz (88.1 kg) (03/19 5379)     Intake/Output from previous day: 03/18 0701 - 03/19 0700 In: 693.4 [P.O.:500; I.V.:93.4; IV Piggyback:100] Out: 915 [Urine:825; Chest Tube:90]   Physical Exam:  Cardiovascular: IRRR IRRR. Pulmonary: Clear to auscultation on right and diminished left base; no rales, wheezes, or rhonchi. Abdomen: Soft, non tender, bowel sounds present. Extremities: No ower extremity edema. Wounds: Clean and dry.  No erythema or signs of infection.   Lab Results: CBC: Recent Labs  02/25/15 1701 02/26/15 0500  WBC  --  11.6*  HGB 9.2* 9.9*  HCT 27.0* 29.1*  PLT  --  151   BMET:  Recent Labs  02/26/15 0500 02/27/15 0415  NA 137 135  K 3.7 3.7  CL 105 103  CO2 27 28  GLUCOSE 147* 115*  BUN 10 11  CREATININE 0.81 0.92  CALCIUM 8.0* 7.8*    PT/INR: No results for input(s): LABPROT, INR in the last 72 hours. ABG:  INR: Will add last result for INR, ABG once components are confirmed Will add last 4 CBG results once components are confirmed  Assessment/Plan:  1. CV - Run of VT earlier this am. Remains in a fib with CVR in the 80's. On Amiodarone 400 bid. Will need anticoagulation-cardiology following. 2.  Pulmonary - On room air. CXR appears to show small left apical pneumothorax,  consolidation at left base.  Encourage incentive spirometer. 3. LOC constipation 4. Possibly discharge in am  ZIMMERMAN,DONIELLE MPA-C 02/28/2015,6:16 AM  Chart reviewed, patient examined, agree with above. Pradaxa started today. Plan home tomorrow if nothing changes.

## 2015-02-28 NOTE — Progress Notes (Signed)
I'm aware that Dr. Roxan Hockey is in agreement with anticoagulation. I have written to start Pradaxa today.  Daryel November, MD

## 2015-03-01 ENCOUNTER — Inpatient Hospital Stay (HOSPITAL_COMMUNITY): Payer: Medicare Other

## 2015-03-01 LAB — GLUCOSE, CAPILLARY: GLUCOSE-CAPILLARY: 122 mg/dL — AB (ref 70–99)

## 2015-03-01 MED ORDER — DABIGATRAN ETEXILATE MESYLATE 150 MG PO CAPS: 150.0000 mg | ORAL_CAPSULE | Freq: Two times a day (BID) | ORAL | Status: AC

## 2015-03-01 MED ORDER — METOPROLOL TARTRATE 25 MG PO TABS: 12.5000 mg | ORAL_TABLET | Freq: Two times a day (BID) | ORAL | Status: AC

## 2015-03-01 MED ORDER — OXYCODONE HCL 5 MG PO TABS: 5.0000 mg | ORAL_TABLET | ORAL | Status: AC | PRN

## 2015-03-01 MED ORDER — ATORVASTATIN CALCIUM 20 MG PO TABS: 20.0000 mg | ORAL_TABLET | Freq: Every day | ORAL | Status: AC

## 2015-03-01 MED ORDER — AMIODARONE HCL 400 MG PO TABS: 400.0000 mg | ORAL_TABLET | Freq: Two times a day (BID) | ORAL | Status: AC

## 2015-03-01 NOTE — Progress Notes (Signed)
Pt/family given discharge instructions, medication lists, follow up appointments, and when to call the doctor.  Pt/family verbalizes understanding. Pt given information on Pradaxa and faxed prescriptions to pt pharmacy per request. Pt given signs and symptoms of infection.  Payton Emerald, RN

## 2015-03-01 NOTE — Progress Notes (Signed)
      [address]       [place] [address]             [phone]       6 Days Post-Op Procedure(s) (LRB): Video assisted thorocoscopy, exploration left chest for post operative bleeding. (Left)  Subjective: Patient eating breakfast. No specific complaints  Objective: Vital signs in last 24 hours: Temp:  [98.2 F (36.8 C)-99.1 F (37.3 C)] 99.1 F (37.3 C) (03/20 0500) Pulse Rate:  [87-89] 88 (03/20 0500) Cardiac Rhythm:  [-] Atrial fibrillation (03/20 0010) Resp:  [18] 18 (03/20 0500) BP: (102-125)/(52-65) 125/65 mmHg (03/20 0500) SpO2:  [95 %-97 %] 95 % (03/20 0500) Weight:  [190 lb 14.7 oz (86.6 kg)] 190 lb 14.7 oz (86.6 kg) (03/20 0500)     Intake/Output from previous day: 03/19 0701 - 03/20 0700 In: 240 [P.O.:240] Out: -    Physical Exam:  Cardiovascular: IRRR IRRR. Pulmonary: Clear to auscultation on right and diminished left base; no rales, wheezes, or rhonchi. Abdomen: Soft, non tender, bowel sounds present. Extremities: No ower extremity edema. Wounds: Clean and dry.  No erythema or signs of infection. There is serous drainage from chest tube site but no sign of infection.  Lab Results: CBC:  Recent Labs  02/28/15 0825  WBC 10.8*  HGB 9.8*  HCT 29.1*  PLT 237   BMET:   Recent Labs  02/27/15 0415  NA 135  K 3.7  CL 103  CO2 28  GLUCOSE 115*  BUN 11  CREATININE 0.92  CALCIUM 7.8*    PT/INR: No results for input(s): LABPROT, INR in the last 72 hours. ABG:  INR: Will add last result for INR, ABG once components are confirmed Will add last 4 CBG results once components are confirmed  Assessment/Plan:  1. CV - Remains in a fib with CVR in the 80's. On Amiodarone 400 bid, Lopressor 12.5 bid, and Pradaxa 150 mg bid 2.  Pulmonary - On room air.  Encourage incentive spirometer. 3.Remove central line 4.Discharge if CXR stable  ZIMMERMAN,DONIELLE MPA-C 03/01/2015,7:38 AM

## 2015-03-04 ENCOUNTER — Ambulatory Visit (INDEPENDENT_AMBULATORY_CARE_PROVIDER_SITE_OTHER): Payer: Self-pay | Admitting: Surgical

## 2015-03-04 ENCOUNTER — Other Ambulatory Visit: Payer: Self-pay | Admitting: *Deleted

## 2015-03-04 ENCOUNTER — Inpatient Hospital Stay (HOSPITAL_COMMUNITY)
Admission: AD | Admit: 2015-03-04 | Discharge: 2015-04-12 | DRG: 917 | Disposition: E | Payer: Medicare Other | Source: Ambulatory Visit | Attending: Thoracic Surgery (Cardiothoracic Vascular Surgery) | Admitting: Thoracic Surgery (Cardiothoracic Vascular Surgery)

## 2015-03-04 ENCOUNTER — Ambulatory Visit
Admission: RE | Admit: 2015-03-04 | Discharge: 2015-03-04 | Disposition: A | Discharge status: Home / Self Care | Payer: Medicare Other | Source: Ambulatory Visit | Attending: Thoracic Surgery (Cardiothoracic Vascular Surgery) | Admitting: Thoracic Surgery (Cardiothoracic Vascular Surgery)

## 2015-03-04 ENCOUNTER — Encounter: Payer: Self-pay | Admitting: *Deleted

## 2015-03-04 ENCOUNTER — Other Ambulatory Visit: Payer: Self-pay | Admitting: Surgical

## 2015-03-04 ENCOUNTER — Other Ambulatory Visit: Payer: Self-pay | Admitting: Thoracic Surgery (Cardiothoracic Vascular Surgery)

## 2015-03-04 VITALS — BP 118/61 | HR 71 | Resp 22 | Ht 66.0 in | Wt 192.5 lb

## 2015-03-04 DIAGNOSIS — I313 Pericardial effusion (noninflammatory): Secondary | ICD-10-CM | POA: Diagnosis not present

## 2015-03-04 DIAGNOSIS — R34 Anuria and oliguria: Secondary | ICD-10-CM | POA: Diagnosis not present

## 2015-03-04 DIAGNOSIS — J90 Pleural effusion, not elsewhere classified: Secondary | ICD-10-CM | POA: Diagnosis not present

## 2015-03-04 DIAGNOSIS — R918 Other nonspecific abnormal finding of lung field: Secondary | ICD-10-CM

## 2015-03-04 DIAGNOSIS — E872 Acidosis: Secondary | ICD-10-CM | POA: Diagnosis present

## 2015-03-04 DIAGNOSIS — J9588 Other intraoperative complications of respiratory system, not elsewhere classified: Secondary | ICD-10-CM | POA: Diagnosis not present

## 2015-03-04 DIAGNOSIS — R0602 Shortness of breath: Secondary | ICD-10-CM

## 2015-03-04 DIAGNOSIS — K7200 Acute and subacute hepatic failure without coma: Secondary | ICD-10-CM | POA: Diagnosis not present

## 2015-03-04 DIAGNOSIS — I481 Persistent atrial fibrillation: Secondary | ICD-10-CM | POA: Diagnosis not present

## 2015-03-04 DIAGNOSIS — R0600 Dyspnea, unspecified: Secondary | ICD-10-CM | POA: Diagnosis not present

## 2015-03-04 DIAGNOSIS — C61 Malignant neoplasm of prostate: Secondary | ICD-10-CM | POA: Diagnosis present

## 2015-03-04 DIAGNOSIS — T85898A Other specified complication of other internal prosthetic devices, implants and grafts, initial encounter: Secondary | ICD-10-CM

## 2015-03-04 DIAGNOSIS — J984 Other disorders of lung: Secondary | ICD-10-CM | POA: Insufficient documentation

## 2015-03-04 DIAGNOSIS — J9383 Other pneumothorax: Secondary | ICD-10-CM | POA: Diagnosis not present

## 2015-03-04 DIAGNOSIS — N179 Acute kidney failure, unspecified: Secondary | ICD-10-CM | POA: Diagnosis not present

## 2015-03-04 DIAGNOSIS — I5031 Acute diastolic (congestive) heart failure: Secondary | ICD-10-CM | POA: Diagnosis present

## 2015-03-04 DIAGNOSIS — C3432 Malignant neoplasm of lower lobe, left bronchus or lung: Secondary | ICD-10-CM

## 2015-03-04 DIAGNOSIS — Z01818 Encounter for other preprocedural examination: Secondary | ICD-10-CM

## 2015-03-04 DIAGNOSIS — A419 Sepsis, unspecified organism: Secondary | ICD-10-CM | POA: Insufficient documentation

## 2015-03-04 DIAGNOSIS — E875 Hyperkalemia: Secondary | ICD-10-CM | POA: Diagnosis present

## 2015-03-04 DIAGNOSIS — Z4659 Encounter for fitting and adjustment of other gastrointestinal appliance and device: Secondary | ICD-10-CM

## 2015-03-04 DIAGNOSIS — G9340 Encephalopathy, unspecified: Secondary | ICD-10-CM | POA: Diagnosis not present

## 2015-03-04 DIAGNOSIS — I429 Cardiomyopathy, unspecified: Secondary | ICD-10-CM

## 2015-03-04 DIAGNOSIS — I2510 Atherosclerotic heart disease of native coronary artery without angina pectoris: Secondary | ICD-10-CM | POA: Diagnosis present

## 2015-03-04 DIAGNOSIS — D649 Anemia, unspecified: Secondary | ICD-10-CM | POA: Diagnosis not present

## 2015-03-04 DIAGNOSIS — N200 Calculus of kidney: Secondary | ICD-10-CM

## 2015-03-04 DIAGNOSIS — Z452 Encounter for adjustment and management of vascular access device: Secondary | ICD-10-CM

## 2015-03-04 DIAGNOSIS — I480 Paroxysmal atrial fibrillation: Secondary | ICD-10-CM | POA: Diagnosis present

## 2015-03-04 DIAGNOSIS — I714 Abdominal aortic aneurysm, without rupture, unspecified: Secondary | ICD-10-CM

## 2015-03-04 DIAGNOSIS — J189 Pneumonia, unspecified organism: Secondary | ICD-10-CM

## 2015-03-04 DIAGNOSIS — J860 Pyothorax with fistula: Secondary | ICD-10-CM | POA: Diagnosis not present

## 2015-03-04 DIAGNOSIS — E871 Hypo-osmolality and hyponatremia: Secondary | ICD-10-CM | POA: Diagnosis not present

## 2015-03-04 DIAGNOSIS — Z87891 Personal history of nicotine dependence: Secondary | ICD-10-CM

## 2015-03-04 DIAGNOSIS — J9589 Other postprocedural complications and disorders of respiratory system, not elsewhere classified: Secondary | ICD-10-CM

## 2015-03-04 DIAGNOSIS — R739 Hyperglycemia, unspecified: Secondary | ICD-10-CM | POA: Diagnosis not present

## 2015-03-04 DIAGNOSIS — R579 Shock, unspecified: Secondary | ICD-10-CM | POA: Diagnosis not present

## 2015-03-04 DIAGNOSIS — T462X5A Adverse effect of other antidysrhythmic drugs, initial encounter: Principal | ICD-10-CM | POA: Diagnosis present

## 2015-03-04 DIAGNOSIS — E785 Hyperlipidemia, unspecified: Secondary | ICD-10-CM | POA: Diagnosis present

## 2015-03-04 DIAGNOSIS — J9621 Acute and chronic respiratory failure with hypoxia: Secondary | ICD-10-CM | POA: Diagnosis not present

## 2015-03-04 DIAGNOSIS — N400 Enlarged prostate without lower urinary tract symptoms: Secondary | ICD-10-CM | POA: Diagnosis present

## 2015-03-04 DIAGNOSIS — I214 Non-ST elevation (NSTEMI) myocardial infarction: Secondary | ICD-10-CM | POA: Diagnosis not present

## 2015-03-04 DIAGNOSIS — R633 Feeding difficulties, unspecified: Secondary | ICD-10-CM

## 2015-03-04 DIAGNOSIS — I472 Ventricular tachycardia: Secondary | ICD-10-CM | POA: Diagnosis present

## 2015-03-04 DIAGNOSIS — I509 Heart failure, unspecified: Secondary | ICD-10-CM

## 2015-03-04 DIAGNOSIS — J9601 Acute respiratory failure with hypoxia: Secondary | ICD-10-CM | POA: Insufficient documentation

## 2015-03-04 DIAGNOSIS — I4891 Unspecified atrial fibrillation: Secondary | ICD-10-CM | POA: Diagnosis not present

## 2015-03-04 DIAGNOSIS — Z9289 Personal history of other medical treatment: Secondary | ICD-10-CM

## 2015-03-04 DIAGNOSIS — E46 Unspecified protein-calorie malnutrition: Secondary | ICD-10-CM | POA: Diagnosis present

## 2015-03-04 DIAGNOSIS — J80 Acute respiratory distress syndrome: Secondary | ICD-10-CM | POA: Diagnosis not present

## 2015-03-04 DIAGNOSIS — J939 Pneumothorax, unspecified: Secondary | ICD-10-CM | POA: Insufficient documentation

## 2015-03-04 DIAGNOSIS — Z66 Do not resuscitate: Secondary | ICD-10-CM | POA: Diagnosis present

## 2015-03-04 DIAGNOSIS — Z9889 Other specified postprocedural states: Secondary | ICD-10-CM

## 2015-03-04 DIAGNOSIS — I129 Hypertensive chronic kidney disease with stage 1 through stage 4 chronic kidney disease, or unspecified chronic kidney disease: Secondary | ICD-10-CM | POA: Diagnosis present

## 2015-03-04 DIAGNOSIS — N189 Chronic kidney disease, unspecified: Secondary | ICD-10-CM | POA: Diagnosis present

## 2015-03-04 DIAGNOSIS — R6521 Severe sepsis with septic shock: Secondary | ICD-10-CM | POA: Diagnosis not present

## 2015-03-04 DIAGNOSIS — J9600 Acute respiratory failure, unspecified whether with hypoxia or hypercapnia: Secondary | ICD-10-CM

## 2015-03-04 DIAGNOSIS — Z4682 Encounter for fitting and adjustment of non-vascular catheter: Secondary | ICD-10-CM

## 2015-03-04 HISTORY — DX: Malignant neoplasm of prostate: C61

## 2015-03-04 HISTORY — DX: Other supraventricular tachycardia: I47.19

## 2015-03-04 HISTORY — DX: Malignant neoplasm of unspecified part of left bronchus or lung: C34.92

## 2015-03-04 HISTORY — DX: Unspecified atrial fibrillation: I48.91

## 2015-03-04 HISTORY — DX: Supraventricular tachycardia: I47.1

## 2015-03-04 LAB — URINALYSIS, ROUTINE W REFLEX MICROSCOPIC
Glucose, UA: NEGATIVE mg/dL
KETONES UR: 15 mg/dL — AB
Leukocytes, UA: NEGATIVE
Nitrite: NEGATIVE
PROTEIN: 100 mg/dL — AB
Specific Gravity, Urine: 1.02 (ref 1.005–1.030)
pH: 6 (ref 5.0–8.0)

## 2015-03-04 LAB — COMPREHENSIVE METABOLIC PANEL
ALBUMIN: 2.3 g/dL — AB (ref 3.5–5.2)
ALT: 52 U/L (ref 0–53)
ANION GAP: 8 (ref 5–15)
AST: 35 U/L (ref 0–37)
Alkaline Phosphatase: 70 U/L (ref 39–117)
BILIRUBIN TOTAL: 0.9 mg/dL (ref 0.3–1.2)
BUN: 16 mg/dL (ref 6–23)
CHLORIDE: 106 mmol/L (ref 96–112)
CO2: 25 mmol/L (ref 19–32)
CREATININE: 1.13 mg/dL (ref 0.50–1.35)
Calcium: 8.2 mg/dL — ABNORMAL LOW (ref 8.4–10.5)
GFR calc non Af Amer: 64 mL/min — ABNORMAL LOW (ref >90)
GFR, EST AFRICAN AMERICAN: 74 mL/min — AB (ref >90)
GLUCOSE: 123 mg/dL — AB (ref 70–99)
Potassium: 4.1 mmol/L (ref 3.5–5.1)
Sodium: 139 mmol/L (ref 135–145)
Total Protein: 5.2 g/dL — ABNORMAL LOW (ref 6.0–8.3)

## 2015-03-04 LAB — CBC WITH DIFFERENTIAL/PLATELET
BASOS ABS: 0 10*3/uL (ref 0.0–0.1)
BASOS PCT: 0 % (ref 0–1)
Eosinophils Absolute: 0.2 10*3/uL (ref 0.0–0.7)
Eosinophils Relative: 1 % (ref 0–5)
HCT: 29.7 % — ABNORMAL LOW (ref 39.0–52.0)
HEMOGLOBIN: 9.7 g/dL — AB (ref 13.0–17.0)
Lymphocytes Relative: 5 % — ABNORMAL LOW (ref 12–46)
Lymphs Abs: 0.8 10*3/uL (ref 0.7–4.0)
MCH: 29.7 pg (ref 26.0–34.0)
MCHC: 32.7 g/dL (ref 30.0–36.0)
MCV: 90.8 fL (ref 78.0–100.0)
Monocytes Absolute: 1.5 10*3/uL — ABNORMAL HIGH (ref 0.1–1.0)
Monocytes Relative: 10 % (ref 3–12)
NEUTROS ABS: 12 10*3/uL — AB (ref 1.7–7.7)
Neutrophils Relative %: 84 % — ABNORMAL HIGH (ref 43–77)
Platelets: 406 10*3/uL — ABNORMAL HIGH (ref 150–400)
RBC: 3.27 MIL/uL — AB (ref 4.22–5.81)
RDW: 13.3 % (ref 11.5–15.5)
WBC: 14.5 10*3/uL — ABNORMAL HIGH (ref 4.0–10.5)

## 2015-03-04 LAB — BRAIN NATRIURETIC PEPTIDE: B Natriuretic Peptide: 474.7 pg/mL — ABNORMAL HIGH (ref 0.0–100.0)

## 2015-03-04 LAB — URINE MICROSCOPIC-ADD ON

## 2015-03-04 MED ORDER — SENNOSIDES-DOCUSATE SODIUM 8.6-50 MG PO TABS
1.0000 | ORAL_TABLET | Freq: Every evening | ORAL | Status: DC | PRN
  Filled 2015-03-04: qty 1

## 2015-03-04 MED ORDER — OXYCODONE HCL 5 MG PO TABS
5.0000 mg | ORAL_TABLET | ORAL | Status: DC | PRN
  Filled 2015-03-04: qty 1

## 2015-03-04 MED ORDER — PIPERACILLIN-TAZOBACTAM 3.375 G IVPB 30 MIN
3.3750 g | Freq: Once | INTRAVENOUS | Status: AC
  Administered 2015-03-04: 3.375 g via INTRAVENOUS
  Filled 2015-03-04: qty 50

## 2015-03-04 MED ORDER — BISACODYL 5 MG PO TBEC: 5.0000 mg | DELAYED_RELEASE_TABLET | Freq: Every day | ORAL | Status: DC | PRN

## 2015-03-04 MED ORDER — ATORVASTATIN CALCIUM 20 MG PO TABS
20.0000 mg | ORAL_TABLET | Freq: Every day | ORAL | Status: DC
  Administered 2015-03-04 – 2015-03-12 (×9): 20 mg via ORAL
  Filled 2015-03-04 (×9): qty 1

## 2015-03-04 MED ORDER — BUPROPION HCL ER (SR) 150 MG PO TB12
150.0000 mg | ORAL_TABLET | Freq: Two times a day (BID) | ORAL | Status: DC
  Administered 2015-03-04 – 2015-03-12 (×16): 150 mg via ORAL
  Filled 2015-03-04 (×17): qty 1

## 2015-03-04 MED ORDER — DABIGATRAN ETEXILATE MESYLATE 150 MG PO CAPS
150.0000 mg | ORAL_CAPSULE | Freq: Two times a day (BID) | ORAL | Status: DC
  Administered 2015-03-04 – 2015-03-12 (×16): 150 mg via ORAL
  Filled 2015-03-04 (×17): qty 1

## 2015-03-04 MED ORDER — ACETAMINOPHEN 650 MG RE SUPP: 650.0000 mg | Freq: Four times a day (QID) | RECTAL | Status: DC | PRN

## 2015-03-04 MED ORDER — SODIUM CHLORIDE 0.9 % IV SOLN: 250.0000 mL | INTRAVENOUS | Status: DC | PRN

## 2015-03-04 MED ORDER — ACETAMINOPHEN 325 MG PO TABS
650.0000 mg | ORAL_TABLET | Freq: Four times a day (QID) | ORAL | Status: DC | PRN
  Administered 2015-03-05: 650 mg via ORAL
  Filled 2015-03-04: qty 2

## 2015-03-04 MED ORDER — DOCUSATE SODIUM 100 MG PO CAPS
100.0000 mg | ORAL_CAPSULE | Freq: Two times a day (BID) | ORAL | Status: DC
  Administered 2015-03-04 – 2015-03-11 (×14): 100 mg via ORAL
  Filled 2015-03-04 (×19): qty 1

## 2015-03-04 MED ORDER — SODIUM CHLORIDE 0.9 % IJ SOLN
3.0000 mL | Freq: Two times a day (BID) | INTRAMUSCULAR | Status: DC
Start: 2015-03-04 — End: 2015-03-11
  Administered 2015-03-05 – 2015-03-09 (×6): 3 mL via INTRAVENOUS

## 2015-03-04 MED ORDER — VANCOMYCIN HCL IN DEXTROSE 1-5 GM/200ML-% IV SOLN
1000.0000 mg | Freq: Two times a day (BID) | INTRAVENOUS | Status: DC
  Administered 2015-03-05 – 2015-03-08 (×7): 1000 mg via INTRAVENOUS
  Filled 2015-03-04 (×9): qty 200

## 2015-03-04 MED ORDER — FLUOXETINE HCL 20 MG PO CAPS
20.0000 mg | ORAL_CAPSULE | Freq: Every day | ORAL | Status: DC
  Administered 2015-03-05 – 2015-03-12 (×8): 20 mg via ORAL
  Filled 2015-03-04 (×8): qty 1

## 2015-03-04 MED ORDER — METOPROLOL TARTRATE 25 MG PO TABS
25.0000 mg | ORAL_TABLET | Freq: Two times a day (BID) | ORAL | Status: DC
  Administered 2015-03-04: 25 mg via ORAL
  Filled 2015-03-04 (×3): qty 1

## 2015-03-04 MED ORDER — FUROSEMIDE 10 MG/ML IJ SOLN
40.0000 mg | Freq: Once | INTRAMUSCULAR | Status: AC
  Administered 2015-03-04: 40 mg via INTRAVENOUS
  Filled 2015-03-04: qty 4

## 2015-03-04 MED ORDER — SODIUM CHLORIDE 0.9 % IJ SOLN
3.0000 mL | Freq: Two times a day (BID) | INTRAMUSCULAR | Status: DC
  Administered 2015-03-06 – 2015-03-13 (×12): 3 mL via INTRAVENOUS
  Administered 2015-03-17: 10:00:00 via INTRAVENOUS

## 2015-03-04 MED ORDER — PIPERACILLIN-TAZOBACTAM 3.375 G IVPB
3.3750 g | Freq: Three times a day (TID) | INTRAVENOUS | Status: DC
  Administered 2015-03-05 – 2015-03-13 (×24): 3.375 g via INTRAVENOUS
  Filled 2015-03-04 (×30): qty 50

## 2015-03-04 MED ORDER — VANCOMYCIN HCL 10 G IV SOLR
1500.0000 mg | Freq: Once | INTRAVENOUS | Status: AC
  Administered 2015-03-04: 1500 mg via INTRAVENOUS
  Filled 2015-03-04: qty 1500

## 2015-03-04 MED ORDER — AMIODARONE HCL 200 MG PO TABS
200.0000 mg | ORAL_TABLET | Freq: Two times a day (BID) | ORAL | Status: DC
  Administered 2015-03-04: 200 mg via ORAL
  Filled 2015-03-04 (×3): qty 1

## 2015-03-04 MED ORDER — SODIUM CHLORIDE 0.9 % IJ SOLN: 3.0000 mL | INTRAMUSCULAR | Status: DC | PRN

## 2015-03-04 NOTE — Progress Notes (Addendum)
ANTIBIOTIC CONSULT NOTE - INITIAL  Pharmacy Consult for vancomycin, Zosyn Indication: pneumonia  No Known Allergies Patient Measurements: Height: 5\' 6"  (167.6 cm) Weight: 192 lb 8 oz (87.317 kg) IBW/kg (Calculated) : 63.8 Vital Signs: Temp: 97.8 F (36.6 C) (03/23 1538) Temp Source: Oral (03/23 1538) BP: 132/65 mmHg (03/23 1538) Pulse Rate: 75 (03/23 1538) Labs: No results for input(s): WBC, HGB, PLT, LABCREA, CREATININE in the last 72 hours. Estimated Creatinine Clearance: 77.4 mL/min (by C-G formula based on Cr of 0.92). No results for input(s): VANCOTROUGH, VANCOPEAK, VANCORANDOM, GENTTROUGH, GENTPEAK, GENTRANDOM, TOBRATROUGH, TOBRAPEAK, TOBRARND, AMIKACINPEAK, AMIKACINTROU, AMIKACIN in the last 72 hours.   Microbiology: Recent Results (from the past 720 hour(s))  Surgical pcr screen     Status: None   Collection Time: 02/20/15  2:41 PM  Result Value Ref Range Status   MRSA, PCR NEGATIVE NEGATIVE Final   Staphylococcus aureus NEGATIVE NEGATIVE Final    Comment:        The Xpert SA Assay (FDA approved for NASAL specimens in patients over 48 years of age), is one component of a comprehensive surveillance program.  Test performance has been validated by [facility] for patients greater than or equal to 84 year old. It is not intended to diagnose infection nor to guide or monitor treatment.     Medical History: Past Medical History  Diagnosis Date  . Hyperlipidemia   . Depression   . Hypertension   . AAA (abdominal aortic aneurysm)     INFRARENAL AAA , 4.4cm , per last CT 01-23-2015--  monitoted by dr early  . Mass of lower lobe of left lung   . Left ureteral calculus   . Bladder stone   . Elevated PSA   . Frequency of urination   . Nocturia   . BPH (benign prostatic hypertrophy)   . History of kidney stones   . Arthritis   . Wears glasses   . At risk for sleep apnea     STOP-BANG= 5        SENT TO PCP 02-17-2015  . Cancer   . Chronic kidney disease    kidney stones    Medications:  Anti-infectives    None     Assessment: 69 year old male with concern for post-operative pneumonia after recent LLL lobectomy on 02/24/15 to start vancomycin and Zosyn. No current antibiotics have been documented given. Patient is currently afebrile. Patient reports intermittent productive sputum and low oxygen saturations in the office of the 70s range. Sputum culture is pending. Labs are pending.   Goal of Therapy:  Vancomycin trough level 15-20 mcg/ml  Clinical resolution of infection.   Plan:  Vancomycin 1500 mg IV x1 now. Zosyn 3.375g IV x1 now. Follow-up labs to provide further dosing. Monitor clinical status, renal function, and culture results.   Sloan Leiter, PharmD, BCPS Clinical Pharmacist [phone] 02/10/2015,4:23 PM  Addendum: Current SCr 1.13 with estimated CrCl ~60-67mL/min.  WBC 14.5.   Plan: Vancomycin 1g IV every 12 hours - next dose 3/24 at 0600 AM.  Zosyn 3.375g IV every 8 hours - extended infusion. Follow-up renal function.   Sloan Leiter, PharmD, BCPS Clinical Pharmacist [phone] 03/10/2015, 6:36 PM

## 2015-03-04 NOTE — H&P (Signed)
Expand All Collapse All   Russell [name] is an 69 y.o. male.  Chief Complaint: Shortness of breath   HPI: The patient is a 69 year old male who was recently hospitalized for left lower lobe lobectomy on 02/24/2015 by Dr. Roxan Hockey for non-small cell carcinoma. He did have postoperative bleeding requiring reexploration also done on 02/24/2015. 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 He has also been recently diagnosed with prostate carcinoma. He was discharged 3 days ago and has had continued progressive shortness of breath. He called the office on today's date and the nursing staff asked him to come in. Oxygen saturations in the office on room air were in the seventies and improved to low nineties with supplemental oxygen. He has had some intermittent productive sputum. He denies fevers, chills or other constitutional symptoms although his stools are somewhat loose. He is not having any difficulty currently with urinating. He has had no difficulties with the incisions. He states he can walk approximately 4 steps and becomes increasingly short of breath. Chest x-ray findings are noted below. He is felt to require readmission to the hospital for further management.  Past Medical History  Diagnosis Date  . Hyperlipidemia   . Depression   . Hypertension   . AAA (abdominal aortic aneurysm)     INFRARENAL AAA , 4.4cm , per last CT 01-23-2015-- monitoted by dr early  . Mass of lower lobe of left lung   . Left ureteral calculus   . Bladder stone   . Elevated PSA   . Frequency of urination   . Nocturia   . BPH (benign prostatic hypertrophy)   . History of kidney stones   . Arthritis   . Wears glasses   . At risk for sleep apnea     STOP-BANG= 5 SENT TO PCP 02-17-2015  . Cancer   . Chronic kidney disease     kidney stones     Past Surgical History  Procedure Laterality Date  . Cholecystectomy open  1992  . Cystoscopy/retrograde/ureteroscopy/stone extraction with basket Left 02/19/2015    Procedure: CYSTO WITH BLADDER STONE REMOVAL/LEFT URETEROSCOPY STONE EXTRACTION ; Surgeon: Irine Seal, MD; Location: [facility]; Service: Urology; Laterality: Left;  . Prostate biopsy N/A 02/19/2015    Procedure: BIOPSY TRANSRECTAL ULTRASONIC PROSTATE (TUBP); Surgeon: Irine Seal, MD; Location: [facility]; Service: Urology; Laterality: N/A;  . Video assisted thoracoscopy (vats)/wedge resection Left 02/23/2015    Procedure: LEFT VIDEO ASSISTED THORACOSCOPY ; Surgeon: Melrose Nakayama, MD; Location: [facility]; Service: Thoracic; Laterality: Left;  . Lobectomy Left 02/23/2015    Procedure: LEFT LOWER LOBECTOMY WITH NODE DISSECTION; Surgeon: Melrose Nakayama, MD; Location: [facility]; Service: Thoracic; Laterality: Left;  . Lead removal Left 02/23/2015    Procedure: CRYO INTERCOSTAL NERVE BLOCK; Surgeon: Melrose Nakayama, MD; Location: [facility]; Service: Thoracic; Laterality: Left;  . Video assisted thoracoscopy (vats)/ lobectomy Left 02/23/2015    Procedure: Video assisted thorocoscopy, exploration left chest for post operative bleeding.; Surgeon: Melrose Nakayama, MD; Location: [place]; Service: Thoracic; Laterality: Left;    Family History  Problem Relation Age of Onset  . Anuerysm Father    Social History:  reports that he quit smoking about 7 years ago. His smoking use included Cigarettes. He has a 46 pack-year smoking history. He has never used smokeless tobacco. He reports that he drinks about 1.2 oz of alcohol per week. He reports that he does not use illicit drugs.  Allergies: No Known Allergies   (Not in a hospital admission)   Lab Results Last 48 Hours    No results found for this or any previous visit  (from the past 48 hour(s)).    Imaging Results (Last 48 hours)    Dg Chest 2 View  02/21/2015 CLINICAL DATA: Lung mass. Shortness of breath. Chest pain . EXAM: CHEST 2 VIEW COMPARISON: 03/01/2015. FINDINGS: Interim removal right IJ catheter. Cardiomegaly with progressive bilateral pulmonary alveolar infiltrates most consistent with congestive heart failure and pulmonary edema. Bilateral pneumonia cannot be excluded. Bilateral pleural effusions left side greater right noted. No pneumothorax. IMPRESSION: Cardiomegaly with progressive bilateral pulmonary infiltrates. Bilateral pleural effusions. These findings are most consistent with congestive heart failure. Bilateral pneumonia cannot be excluded. Reference is made to prior PET-CT of 02/04/2015 which demonstrated left lower lobe PET positive mass. Electronically Signed By: Marcello Moores Register On: 03/06/2015 13:00     Review of Systems  Constitutional: Positive for malaise/fatigue. Negative for fever, chills and diaphoresis.  HENT: Negative for congestion and sore throat.  Eyes: Negative for blurred vision and double vision.  Respiratory: Positive for cough, hemoptysis, sputum production and shortness of breath. Negative for wheezing and stridor.  Cardiovascular: Positive for leg swelling. Negative for chest pain, palpitations, claudication and PND.  Gastrointestinal: Negative for heartburn, nausea, vomiting, abdominal pain, diarrhea, constipation, blood in stool and melena.  Genitourinary: Negative.  Musculoskeletal: Negative.  Skin: Negative for itching and rash.  Neurological: Positive for weakness. Negative for headaches.  Endo/Heme/Allergies: Negative.  Psychiatric/Behavioral: Negative.    Blood pressure 118/61, pulse 71, resp. rate 22, height 5\' 6"  (1.676 m), weight 192 lb 8 oz (87.317 kg), SpO2 77 %. Physical Exam  Constitutional: He is oriented to person, place, and time. He appears well-developed. No distress.   HENT:  Head: Normocephalic and atraumatic.  Mouth/Throat: Oropharynx is clear and moist. No oropharyngeal exudate.  Eyes: Conjunctivae and EOM are normal. Pupils are equal, round, and reactive to light. Right eye exhibits no discharge. Left eye exhibits no discharge. No scleral icterus.  Neck: No JVD present. No tracheal deviation present. No thyromegaly present.  Cardiovascular: Normal rate and regular rhythm. Exam reveals friction rub. Exam reveals no gallop.  No murmur heard. Respiratory: He is in respiratory distress. He has no wheezes. He has no rales.  Dim BS in bases GI: Bowel sounds are normal. He exhibits no distension and no mass. There is no tenderness. There is no rebound and no guarding.  Musculoskeletal: He exhibits edema.  Lymphadenopathy:   He has no cervical adenopathy.  Neurological: He is alert and oriented to person, place, and time.  Skin: Skin is warm and dry. He is not diaphoretic.  Psychiatric: He has a normal mood and affect.      Medication List       This list is accurate as of: 02/13/2015 1:58 PM. Always use your most recent med list.              amiodarone 400 MG tablet  Commonly known as: PACERONE  Take 1 tablet (400 mg total) by mouth 2 (two) times daily. For 5 days;then take Amiodarone 200 mg by mouth two times daily for one week;then take Amiodarone 200 mg by mouth daily thereafter     aspirin 81 MG tablet  Take 81 mg by mouth daily.     atorvastatin 20 MG tablet  Commonly known as: LIPITOR  Take 1 tablet (20 mg total) by mouth daily at 6 PM.  buPROPion 75 MG tablet  Commonly known as: WELLBUTRIN  Take 150 mg by mouth 2 (two) times daily.     dabigatran 150 MG Caps capsule  Commonly known as: PRADAXA  Take 1 capsule (150 mg total) by mouth every 12 (twelve) hours.     Fish Oil 1000 MG Caps  Take 1,000 mg by mouth daily.     FLUoxetine 20 MG capsule  Commonly known as: PROZAC   Take 20 mg by mouth every morning.     GLUCOSAMINE PO  Take by mouth daily.     metoprolol tartrate 25 MG tablet  Commonly known as: LOPRESSOR  Take 0.5 tablets (12.5 mg total) by mouth 2 (two) times daily.     oxyCODONE 5 MG immediate release tablet  Commonly known as: Oxy IR/ROXICODONE  Take 1-2 tablets (5-10 mg total) by mouth every 3 (three) hours as needed for severe pain.      *these are medications at discharge. The patient reports he is currently not taking aspirin, glucosamine, omega-3 fatty acids and also is not requiring oxycodone.*   Assessment/Plan The patient is short of breath. There are findings on chest x-ray and physical exam consistent with congestive failure as well as possible pneumonia. He will require admission for further management.Jadene Pierini E 03/06/2015, 1:47 PM

## 2015-03-04 NOTE — Progress Notes (Signed)
Russell [name] is an 68 y.o. male.   Chief Complaint: Shortness of breath   HPI: The patient is a 69 year old male who was recently hospitalized for left lower lobe lobectomy on 02/24/2015 by Dr. Roxan Hockey for non-small cell carcinoma. He did have postoperative bleeding requiring reexploration also done on 02/24/2015. 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 He has also been recently diagnosed with prostate carcinoma. He was discharged 3 days ago and has had continued progressive shortness of breath. He called the office on today's date and the nursing staff asked him to come in. Oxygen saturations in the office on room air were in the seventies and improved to low nineties with supplemental oxygen. He has had some intermittent productive sputum. He denies fevers, chills or other constitutional symptoms although his stools are somewhat loose. He is not having any difficulty currently with urinating. He has had no difficulties with the incisions. He states he can walk approximately 4 steps and becomes increasingly short of breath. Chest x-ray findings are noted below. He is felt to require readmission to the hospital for further management.  Past Medical History  Diagnosis Date  . Hyperlipidemia   . Depression   . Hypertension   . AAA (abdominal aortic aneurysm)     INFRARENAL AAA , 4.4cm , per last CT 01-23-2015--  monitoted by dr early  . Mass of lower lobe of left lung   . Left ureteral calculus   . Bladder stone   . Elevated PSA   . Frequency of urination   . Nocturia   . BPH (benign prostatic hypertrophy)   . History of kidney stones   . Arthritis   . Wears glasses   . At risk for sleep apnea     STOP-BANG= 5        SENT TO PCP 02-17-2015  . Cancer   . Chronic kidney disease     kidney stones    Past Surgical History  Procedure Laterality Date  . Cholecystectomy open  1992  .  Cystoscopy/retrograde/ureteroscopy/stone extraction with basket Left 02/19/2015    Procedure: CYSTO WITH BLADDER STONE REMOVAL/LEFT URETEROSCOPY STONE EXTRACTION ;  Surgeon: Irine Seal, MD;  Location: [facility];  Service: Urology;  Laterality: Left;  . Prostate biopsy N/A 02/19/2015    Procedure: BIOPSY TRANSRECTAL ULTRASONIC PROSTATE (TUBP);  Surgeon: Irine Seal, MD;  Location: [facility];  Service: Urology;  Laterality: N/A;  . Video assisted thoracoscopy (vats)/wedge resection Left 02/23/2015    Procedure: LEFT VIDEO ASSISTED THORACOSCOPY ;  Surgeon: Melrose Nakayama, MD;  Location: [place];  Service: Thoracic;  Laterality: Left;  . Lobectomy Left 02/23/2015    Procedure:  LEFT LOWER LOBECTOMY WITH NODE DISSECTION;  Surgeon: Melrose Nakayama, MD;  Location: [place];  Service: Thoracic;  Laterality: Left;  . Lead removal Left 02/23/2015    Procedure: CRYO INTERCOSTAL NERVE BLOCK;  Surgeon: Melrose Nakayama, MD;  Location: [place];  Service: Thoracic;  Laterality: Left;  . Video assisted thoracoscopy (vats)/ lobectomy Left 02/23/2015    Procedure: Video assisted thorocoscopy, exploration left chest for post operative bleeding.;  Surgeon: Melrose Nakayama, MD;  Location: [place];  Service: Thoracic;  Laterality: Left;    Family History  Problem Relation Age of Onset  . Anuerysm Father    Social History:  reports that he quit smoking about 7 years ago. His smoking use included Cigarettes. He has a 46 pack-year smoking  history. He has never used smokeless tobacco. He reports that he drinks about 1.2 oz of alcohol per week. He reports that he does not use illicit drugs.  Allergies: No Known Allergies   (Not in a hospital admission)  No results found for this or any previous visit (from the past 48 hour(s)). Dg Chest 2 View  03/02/2015   CLINICAL DATA:  Lung mass.  Shortness of breath.  Chest pain .  EXAM: CHEST  2 VIEW  COMPARISON:  03/01/2015.  FINDINGS:  Interim removal right IJ catheter. Cardiomegaly with progressive bilateral pulmonary alveolar infiltrates most consistent with congestive heart failure and pulmonary edema. Bilateral pneumonia cannot be excluded. Bilateral pleural effusions left side greater right noted. No pneumothorax.  IMPRESSION: Cardiomegaly with progressive bilateral pulmonary infiltrates. Bilateral pleural effusions. These findings are most consistent with congestive heart failure. Bilateral pneumonia cannot be excluded. Reference is made to prior PET-CT of 02/04/2015 which demonstrated left lower lobe PET positive mass.   Electronically Signed   By: Marcello Moores  Register   On: 02/18/2015 13:00    Review of Systems  Constitutional: Positive for malaise/fatigue. Negative for fever, chills and diaphoresis.  HENT: Negative for congestion and sore throat.   Eyes: Negative for blurred vision and double vision.  Respiratory: Positive for cough, hemoptysis, sputum production and shortness of breath. Negative for wheezing and stridor.   Cardiovascular: Positive for leg swelling. Negative for chest pain, palpitations, claudication and PND.  Gastrointestinal: Negative for heartburn, nausea, vomiting, abdominal pain, diarrhea, constipation, blood in stool and melena.  Genitourinary: Negative.   Musculoskeletal: Negative.   Skin: Negative for itching and rash.  Neurological: Positive for weakness. Negative for headaches.  Endo/Heme/Allergies: Negative.   Psychiatric/Behavioral: Negative.     Blood pressure 118/61, pulse 71, resp. rate 22, height 5\' 6"  (1.676 m), weight 192 lb 8 oz (87.317 kg), SpO2 77 %. Physical Exam  Constitutional: He is oriented to person, place, and time. He appears well-developed. No distress.  HENT:  Head: Normocephalic and atraumatic.  Mouth/Throat: Oropharynx is clear and moist. No oropharyngeal exudate.  Eyes: Conjunctivae and EOM are normal. Pupils are equal, round, and reactive to light. Right eye exhibits  no discharge. Left eye exhibits no discharge. No scleral icterus.  Neck: No JVD present. No tracheal deviation present. No thyromegaly present.  Cardiovascular: Normal rate and regular rhythm.  Exam reveals friction rub. Exam reveals no gallop.   No murmur heard. Respiratory: He is in respiratory distress. He has no wheezes. He has no rales.  Dim BS in bases   GI: Bowel sounds are normal. He exhibits no distension and no mass. There is no tenderness. There is no rebound and no guarding.  Musculoskeletal: He exhibits edema.  Lymphadenopathy:    He has no cervical adenopathy.  Neurological: He is alert and oriented to person, place, and time.  Skin: Skin is warm and dry. He is not diaphoretic.  Psychiatric: He has a normal mood and affect.      Medication List       This list is accurate as of: 02/21/2015  1:58 PM.  Always use your most recent med list.               amiodarone 400 MG tablet  Commonly known as:  PACERONE  Take 1 tablet (400 mg total) by mouth 2 (two) times daily. For 5 days;then take Amiodarone 200 mg by mouth two times daily for one week;then take Amiodarone 200 mg by mouth daily thereafter  aspirin 81 MG tablet  Take 81 mg by mouth daily.     atorvastatin 20 MG tablet  Commonly known as:  LIPITOR  Take 1 tablet (20 mg total) by mouth daily at 6 PM.     buPROPion 75 MG tablet  Commonly known as:  WELLBUTRIN  Take 150 mg by mouth 2 (two) times daily.     dabigatran 150 MG Caps capsule  Commonly known as:  PRADAXA  Take 1 capsule (150 mg total) by mouth every 12 (twelve) hours.     Fish Oil 1000 MG Caps  Take 1,000 mg by mouth daily.     FLUoxetine 20 MG capsule  Commonly known as:  PROZAC  Take 20 mg by mouth every morning.     GLUCOSAMINE PO  Take by mouth daily.     metoprolol tartrate 25 MG tablet  Commonly known as:  LOPRESSOR  Take 0.5 tablets (12.5 mg total) by mouth 2 (two) times daily.     oxyCODONE 5 MG immediate release tablet   Commonly known as:  Oxy IR/ROXICODONE  Take 1-2 tablets (5-10 mg total) by mouth every 3 (three) hours as needed for severe pain.      *these are medications at discharge. The patient reports he is currently not taking aspirin, glucosamine, omega-3 fatty acids and also is not requiring oxycodone.*   Assessment/Plan The patient is short of breath. There are findings on chest x-ray and physical exam consistent with congestive failure as well as possible pneumonia. He will require admission for further management.Jadene Pierini E 02/23/2015, 1:47 PM

## 2015-03-04 NOTE — Patient Instructions (Signed)
To be admitted to hospital

## 2015-03-04 NOTE — H&P (Signed)
Per Naj B. @ [facility].Marland KitchenREF# [id number] for the direct admission from Dr. Remo Lipps Hendrickson's office today.

## 2015-03-04 NOTE — Progress Notes (Signed)
Pt arrived to floor @ 1630 from admitting as direct admit to floor.  Oriented to the room and call bell at reach.  Instructed to call for assistance.  Verbalized understanding.  Wife at bedside.  Will continue to monitor.  Markies Mowatt,RN.

## 2015-03-05 ENCOUNTER — Encounter (HOSPITAL_COMMUNITY): Payer: Self-pay | Admitting: *Deleted

## 2015-03-05 ENCOUNTER — Inpatient Hospital Stay (HOSPITAL_COMMUNITY): Payer: Medicare Other

## 2015-03-05 LAB — GLUCOSE, CAPILLARY: GLUCOSE-CAPILLARY: 134 mg/dL — AB (ref 70–99)

## 2015-03-05 MED ORDER — FUROSEMIDE 40 MG PO TABS
40.0000 mg | ORAL_TABLET | Freq: Every day | ORAL | Status: DC
  Administered 2015-03-06 – 2015-03-12 (×7): 40 mg via ORAL
  Filled 2015-03-05 (×8): qty 1

## 2015-03-05 MED ORDER — FUROSEMIDE 10 MG/ML IJ SOLN
40.0000 mg | Freq: Once | INTRAMUSCULAR | Status: AC
  Administered 2015-03-05: 40 mg via INTRAVENOUS

## 2015-03-05 MED ORDER — AMIODARONE HCL 200 MG PO TABS
200.0000 mg | ORAL_TABLET | Freq: Every day | ORAL | Status: DC
  Administered 2015-03-06: 200 mg via ORAL
  Filled 2015-03-05 (×2): qty 1

## 2015-03-05 MED ORDER — METOPROLOL TARTRATE 12.5 MG HALF TABLET
12.5000 mg | ORAL_TABLET | Freq: Two times a day (BID) | ORAL | Status: DC
  Administered 2015-03-05 – 2015-03-13 (×13): 12.5 mg via ORAL
  Filled 2015-03-05 (×18): qty 1

## 2015-03-05 MED ORDER — POTASSIUM CHLORIDE CRYS ER 20 MEQ PO TBCR
20.0000 meq | EXTENDED_RELEASE_TABLET | Freq: Two times a day (BID) | ORAL | Status: DC
  Administered 2015-03-05 – 2015-03-12 (×14): 20 meq via ORAL
  Filled 2015-03-05 (×15): qty 1

## 2015-03-05 NOTE — Progress Notes (Addendum)
      [address]       [place] [address]             [phone]            Subjective: Patient states his breathing is better.  Objective: Vital signs in last 24 hours: Temp:  [97.5 F (36.4 C)-99.2 F (37.3 C)] 97.5 F (36.4 C) (03/24 0614) Pulse Rate:  [58-82] 58 (03/24 0614) Cardiac Rhythm:  [-] Atrial fibrillation (03/23 2203) Resp:  [18-22] 18 (03/24 0614) BP: (106-139)/(50-65) 106/50 mmHg (03/24 0614) SpO2:  [77 %-94 %] 90 % (03/24 0614) Weight:  [179 lb 9.6 oz (81.466 kg)-192 lb 8 oz (87.317 kg)] 179 lb 9.6 oz (81.466 kg) (03/24 0614)     Intake/Output from previous day: 03/23 0701 - 03/24 0700 In: 1040 [P.O.:240; IV Piggyback:800] Out: 2725 [Urine:2725]   Physical Exam:  Cardiovascular: IRRR IRRR. Pulmonary: Right lung is mostly clear. Diminished at left base; no rales, wheezes, or rhonchi. Abdomen: Soft, non tender, bowel sounds present. Extremities: Trace lower extremity edema. Wounds: Clean and dry.  No erythema or signs of infection.   Lab Results: CBC: Recent Labs  02/12/2015 1658  WBC 14.5*  HGB 9.7*  HCT 29.7*  PLT 406*   BMET:  Recent Labs  02/20/2015 1658  NA 139  K 4.1  CL 106  CO2 25  GLUCOSE 123*  BUN 16  CREATININE 1.13  CALCIUM 8.2*    PT/INR: No results for input(s): LABPROT, INR in the last 72 hours. ABG:  INR: Will add last result for INR, ABG once components are confirmed Will add last 4 CBG results once components are confirmed  Assessment/Plan:  1. CV - SB in the 50's this am. On Pradaxa 150 mg bid, Lopressor 25 mg bid and Amiodarone 200 mg bid. Will decrease both and monitor. 2.  Pulmonary - On 2 liters of oxygen via [place]. CXR not taken yet. Will await results. 3. Anemia-H and H 9.7 and 29.7 4.ID-On Suzie Portela and Zosyn for PNA  ZIMMERMAN,DONIELLE MPA-C 03/05/2015,8:37 AM  Patient seen and examined, agree with above He looks better today Chest x-ray unchanged. BNP elevated- acute diastolic  heart failure, possible systolic component as well- will check echo in AM Give additional IV lasix today, start PO tomorrow Continue vanc and zosyn Renal U/s OK  Phil Michels C. Roxan Hockey, MD [facility] [phone]

## 2015-03-05 NOTE — Care Management Note (Addendum)
    Page 1 of 1   03/13/2015     2:15:13 PM CARE MANAGEMENT NOTE 03/13/2015  Patient:  Russell [name], Russell [name]   Account Number:  [id number]  Date Initiated:  03/05/2015  Documentation initiated by:  AMERSON,JULIE  Subjective/Objective Assessment:   Pt adm on 03/03/2015 with post op PNA s/p LT VATS with lobectomy on 02/23/15.  PTA, pt resides at home with spouse.     Action/Plan:   Will follow for dc needs as pt progresses.  May need home oxygen, per report.   Anticipated DC Date:  03/12/2015   Anticipated DC Plan:  [place]  CM consult      Choice offered to / List presented to:             Status of service:  Completed, signed off Medicare Important Message given?  YES (If response is "NO", the following Medicare IM given date fields will be blank) Date Medicare IM given:  03/10/2015 Medicare IM given by:  Elissa Hefty Date Additional Medicare IM given:  03/09/2015 Additional Medicare IM given by:  JULIE AMERSON  Discharge Disposition:    Per UR Regulation:  Reviewed for med. necessity/level of care/duration of stay  If discussed at [place] of Stay Meetings, dates discussed:   03/12/2015    Comments:  ContactPercell, [place] Spouse  home - [phone]   cell - [place] Discharge CM Girtha Hake [phone]  03-13-15 2:10pm Luz Lex, RNBSN [phone] Talked with wife at bedside.  They have children but out of town.  Lives two hours away and have pets to care for Industry visits.  Verified numbers listed above are correct. [employer] CM Girtha Hake called - updated - She will be assisting with dc needs - her contact number is [phone]  3/29 1034a debbie dowell rn,bsn trasnf to ccu. pt on nrb mask.

## 2015-03-05 NOTE — Progress Notes (Signed)
Patient ID: Russell [name], male   DOB: Jul 02, 1946, 69 y.o.   MRN: [id number]  Patient's wife called the office about some kidney pain the patient is having.   He had a left ureteroscopic stone extraction a couple of weeks ago.  His UA is clear.    I would recommend a renal US and if that is negative for hydronephrosis, it is unlikely that the pain is stone related.   I have let the wife know that I ordered the [place].

## 2015-03-05 NOTE — Evaluation (Signed)
Physical Therapy Evaluation Patient Details Name: Russell [name] MRN: [id number] DOB: February 27, 1946 Today's Date: 03/05/2015   History of Present Illness  69 y.o. male admitted on 03/01/2015 with post op PNA s/p LT VATS with lobectomy on 02/23/15.  Clinical Impression  Pt admitted with the above complications. Pt currently with functional limitations due to the deficits listed below (see PT Problem List). Ambulates with mild balance deficits requiring min guard - min assist for stability. SpO2 at rest was 93% on 3L supplemental O2 but quickly dropped to 79% while ambulating; returning to low 90s after 3 minutes of sitting with cues for pursed lip breathing on 3L supplemental O2. Will benefit from HHPT for home safely evaluation. Pt will benefit from skilled PT to increase their independence and safety with mobility to allow discharge to the venue listed below.       Follow Up Recommendations Home health PT;Supervision for mobility/OOB    Equipment Recommendations   (TBD)    Recommendations for Other Services       Precautions / Restrictions Precautions Precaution Comments: Monitor O2 Restrictions Weight Bearing Restrictions: No      Mobility  Bed Mobility Overal bed mobility: Modified Independent             General bed mobility comments: requires extra time  Transfers Overall transfer level: Modified independent                  Ambulation/Gait Ambulation/Gait assistance: Min assist Ambulation Distance (Feet): 150 Feet Assistive device: None Gait Pattern/deviations: Step-through pattern;Decreased stride length;Staggering left Gait velocity: decreased   General Gait Details: Pt ambulated majority of distance at a min guard level for safety, but had one instance of staggering towards his left that required min assist to correct. VC for safety awareness, pursed lip breathing technique and energy conservation techniques. SpO2 in 90s on 3L at rest, dropped to 79% with  ambulation but returns to 90s again with 3 min seated rest break.  Stairs            Wheelchair Mobility    Modified Rankin (Stroke Patients Only)       Balance Overall balance assessment: Needs assistance Sitting-balance support: No upper extremity supported;Feet supported Sitting balance-Leahy Scale: Normal     Standing balance support: No upper extremity supported Standing balance-Leahy Scale: Fair                               Pertinent Vitals/Pain Pain Assessment: 0-10 Pain Score: 5  Pain Location: left posterior flank Pain Descriptors / Indicators: Sharp Pain Intervention(s): Monitored during session;Repositioned    Home Living Family/patient expects to be discharged to:: Private residence Living Arrangements: Spouse/significant other Available Help at Discharge: Family;Available 24 hours/day Type of Home: House Home Access: Stairs to enter Entrance Stairs-Rails: Psychiatric nurse of Steps: 4 Home Layout: Two level;Able to live on main level with bedroom/bathroom Home Equipment: Kasandra Knudsen - single point      Prior Function Level of Independence: Independent               Hand Dominance   Dominant Hand: Right    Extremity/Trunk Assessment   Upper Extremity Assessment: Defer to OT evaluation           Lower Extremity Assessment: Overall WFL for tasks assessed         Communication   Communication: No difficulties  Cognition Arousal/Alertness: Awake/alert Behavior During Therapy: [facility] for tasks  assessed/performed Overall Cognitive Status: Within Functional Limits for tasks assessed                      General Comments General comments (skin integrity, edema, etc.): Pt was on 2L supplemental O2 when PT entered room and SpO2 registered at 84% with HR in 80s. Titrated to 3L with improvement of SpO2 into low 90s. Dropped to 79% while ambulating on 3L with 3/4 dyspnea, and took 3 minutes for SpO2 to return  to 90s on 3L.    Exercises General Exercises - Lower Extremity Ankle Circles/Pumps: AROM;Both;5 reps;Seated Long Arc Quad: AROM;Strengthening;Both;5 reps;Seated      Assessment/Plan    PT Assessment Patient needs continued PT services  PT Diagnosis Difficulty walking;Acute pain   PT Problem List Decreased activity tolerance;Decreased balance;Decreased mobility;Decreased knowledge of use of DME;Cardiopulmonary status limiting activity;Pain  PT Treatment Interventions DME instruction;Gait training;Functional mobility training;Therapeutic activities;Therapeutic exercise;Balance training;Neuromuscular re-education;Patient/family education;Modalities   PT Goals (Current goals can be found in the Care Plan section) Acute Rehab PT Goals Patient Stated Goal: go home PT Goal Formulation: With patient Time For Goal Achievement: 03/19/15 Potential to Achieve Goals: Good    Frequency Min 3X/week   Barriers to discharge        Co-evaluation               End of Session Equipment Utilized During Treatment: Oxygen Activity Tolerance: Patient tolerated treatment well Patient left: in chair;with call bell/phone within reach Nurse Communication: Mobility status;Other (comment) (SpO2 levels at rest and mobilizing)         Time: 5320-2334 PT Time Calculation (min) (ACUTE ONLY): 27 min   Charges:   PT Evaluation $Initial PT Evaluation Tier I: 1 Procedure PT Treatments $Gait Training: 8-22 mins   PT G Codes:        Ellouise Newer 03/05/2015, 3:58 PM Camille Bal [place], [place]

## 2015-03-06 ENCOUNTER — Inpatient Hospital Stay (HOSPITAL_COMMUNITY): Payer: Medicare Other

## 2015-03-06 ENCOUNTER — Encounter (HOSPITAL_COMMUNITY): Payer: Self-pay | Admitting: Nurse Practitioner

## 2015-03-06 DIAGNOSIS — I4891 Unspecified atrial fibrillation: Secondary | ICD-10-CM

## 2015-03-06 DIAGNOSIS — R0600 Dyspnea, unspecified: Secondary | ICD-10-CM

## 2015-03-06 DIAGNOSIS — I480 Paroxysmal atrial fibrillation: Secondary | ICD-10-CM

## 2015-03-06 LAB — BASIC METABOLIC PANEL
Anion gap: 10 (ref 5–15)
BUN: 16 mg/dL (ref 6–23)
CALCIUM: 8 mg/dL — AB (ref 8.4–10.5)
CO2: 27 mmol/L (ref 19–32)
CREATININE: 1.38 mg/dL — AB (ref 0.50–1.35)
Chloride: 102 mmol/L (ref 96–112)
GFR calc Af Amer: 58 mL/min — ABNORMAL LOW (ref >90)
GFR calc non Af Amer: 50 mL/min — ABNORMAL LOW (ref >90)
Glucose, Bld: 162 mg/dL — ABNORMAL HIGH (ref 70–99)
Potassium: 3.5 mmol/L (ref 3.5–5.1)
Sodium: 139 mmol/L (ref 135–145)

## 2015-03-06 LAB — CBC
HCT: 28.9 % — ABNORMAL LOW (ref 39.0–52.0)
HEMOGLOBIN: 9.5 g/dL — AB (ref 13.0–17.0)
MCH: 29.3 pg (ref 26.0–34.0)
MCHC: 32.9 g/dL (ref 30.0–36.0)
MCV: 89.2 fL (ref 78.0–100.0)
Platelets: 482 10*3/uL — ABNORMAL HIGH (ref 150–400)
RBC: 3.24 MIL/uL — AB (ref 4.22–5.81)
RDW: 13.3 % (ref 11.5–15.5)
WBC: 18.1 10*3/uL — ABNORMAL HIGH (ref 4.0–10.5)

## 2015-03-06 MED ORDER — AMIODARONE HCL 200 MG PO TABS
400.0000 mg | ORAL_TABLET | Freq: Two times a day (BID) | ORAL | Status: DC
  Administered 2015-03-06 – 2015-03-08 (×5): 400 mg via ORAL
  Filled 2015-03-06 (×8): qty 2

## 2015-03-06 NOTE — Progress Notes (Signed)
PT Cancellation Note  Patient Details Name: Russell [name] MRN: [id number] DOB: 05/22/1946   Cancelled Treatment:    Reason Eval/Treat Not Completed: Other (comment). Pt and wife stating adamantly that cardiologist told them not to have pt ambulating, RN unaware and this was not documented. Pt has been ambulating within room with nursing and wife. Will re-attempt to see Monday as available.    Gustavus Bryant  [place]  03/06/2015, 2:03 PM

## 2015-03-06 NOTE — Progress Notes (Addendum)
      [address]       [place] [address]             [phone]            Subjective: Patient states breathing is worse this am.  Objective: Vital signs in last 24 hours: Temp:  [97.9 F (36.6 C)-98.7 F (37.1 C)] 98.2 F (36.8 C) (03/25 0517) Pulse Rate:  [64-81] 64 (03/25 0517) Cardiac Rhythm:  [-] Normal sinus rhythm;Heart block (03/25 0724) Resp:  [17-18] 17 (03/25 0517) BP: (97-122)/(41-61) 102/41 mmHg (03/25 0517) SpO2:  [90 %-96 %] 94 % (03/25 0524) Weight:  [179 lb 11.2 oz (81.511 kg)] 179 lb 11.2 oz (81.511 kg) (03/25 0517)     Intake/Output from previous day: 03/24 0701 - 03/25 0700 In: 1458 [P.O.:908; IV Piggyback:550] Out: 1970 [Urine:1970]   Physical Exam:  Cardiovascular: RRR Pulmonary: Right lung is mostly clear. Very diminished at left base; no rales, wheezes, or rhonchi. Abdomen: Soft, non tender, bowel sounds present. Extremities: Trace lower extremity edema. Wounds: Clean and dry.  No erythema or signs of infection.   Lab Results: CBC:  Recent Labs  02/10/2015 1658 03/06/15 0540  WBC 14.5* 18.1*  HGB 9.7* 9.5*  HCT 29.7* 28.9*  PLT 406* 482*   BMET:   Recent Labs  02/15/2015 1658  NA 139  K 4.1  CL 106  CO2 25  GLUCOSE 123*  BUN 16  CREATININE 1.13  CALCIUM 8.2*    PT/INR: No results for input(s): LABPROT, INR in the last 72 hours. ABG:  INR: Will add last result for INR, ABG once components are confirmed Will add last 4 CBG results once components are confirmed  Assessment/Plan:  1. CV - SR in the 70's this am. On Pradaxa 150 mg bid, Lopressor 12.5 mg bid and Amiodarone 200 mg daily. Await echo. 2.  Pulmonary - Now on 5 liters of oxygen via [place] this am. CXR shows persistent bilateral interstitial and alveolar opacities (likely PNA), and left pleural effusion. Will discuss with Dr. Roxan Hockey if feels left thoracentesis needed.Encourage incentive spirometer 3. Anemia-H and H stable at 9.5 and  28.9 4.ID-On Vanco and Zosyn for PNA 5. Volume overload (heart failure)-Given IV Lasix previously. Will give orally today and tonight.  ZIMMERMAN,DONIELLE MPA-C 03/06/2015,9:44 AM   Patient seen and examined, agree with above He subjectively feels his breathing is worse despite being 2 L negative since admission and being on broad spectrum antibiotics Continue vanco and zosyn I suspect pneumonia is the primary issue but there may be a component of CHF aggravating it. Amiodarone toxicity is another possibility, but will continue amiodarone for now pending Cardiology reassessment  Revonda Standard. Roxan Hockey, MD [facility] [phone]

## 2015-03-06 NOTE — Progress Notes (Signed)
  Echocardiogram 2D Echocardiogram has been performed.  Russell [name] 03/06/2015, 4:49 PM

## 2015-03-06 NOTE — H&P (Signed)
Chief Complaint: Shortness of breath   HPI: The patient is a 69 year old male who was recently hospitalized for left lower lobe lobectomy on 02/24/2015 by Dr. Roxan Hockey for non-small cell carcinoma. He did have postoperative bleeding requiring reexploration also done on 02/24/2015. 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 He has also been recently diagnosed with prostate carcinoma. He was discharged 3 days ago and has had continued progressive shortness of breath. He called the office on today's date and the nursing staff asked him to come in. Oxygen saturations in the office on room air were in the seventies and improved to low nineties with supplemental oxygen. He has had some intermittent productive sputum. He denies fevers, chills or other constitutional symptoms although his stools are somewhat loose. He is not having any difficulty currently with urinating. He has had no difficulties with the incisions. He states he can walk approximately 4 steps and becomes increasingly short of breath. Chest x-ray findings are noted below. He is felt to require readmission to the hospital for further management.  Past Medical History  Diagnosis Date  . Hyperlipidemia   . Depression   . Hypertension   . AAA (abdominal aortic aneurysm)     INFRARENAL AAA , 4.4cm , per last CT 01-23-2015-- monitoted by dr early  . Mass of lower lobe of left lung   . Left ureteral calculus   . Bladder stone   . Elevated PSA   . Frequency of urination   . Nocturia   . BPH (benign prostatic hypertrophy)   . History of kidney stones   . Arthritis   . Wears glasses   . At risk for sleep apnea     STOP-BANG= 5 SENT TO PCP 02-17-2015  . Cancer   . Chronic kidney disease     kidney stones     Past Surgical History  Procedure Laterality Date  . Cholecystectomy open  1992  . Cystoscopy/retrograde/ureteroscopy/stone extraction with basket Left 02/19/2015    Procedure: CYSTO WITH BLADDER STONE REMOVAL/LEFT URETEROSCOPY STONE EXTRACTION ; Surgeon: Irine Seal, MD; Location: [facility]; Service: Urology; Laterality: Left;  . Prostate biopsy N/A 02/19/2015    Procedure: BIOPSY TRANSRECTAL ULTRASONIC PROSTATE (TUBP); Surgeon: Irine Seal, MD; Location: [facility]; Service: Urology; Laterality: N/A;  . Video assisted thoracoscopy (vats)/wedge resection Left 02/23/2015    Procedure: LEFT VIDEO ASSISTED THORACOSCOPY ; Surgeon: Melrose Nakayama, MD; Location: [place]; Service: Thoracic; Laterality: Left;  . Lobectomy Left 02/23/2015    Procedure: LEFT LOWER LOBECTOMY WITH NODE DISSECTION; Surgeon: Melrose Nakayama, MD; Location: [place]; Service: Thoracic; Laterality: Left;  . Lead removal Left 02/23/2015    Procedure: CRYO INTERCOSTAL NERVE BLOCK; Surgeon: Melrose Nakayama, MD; Location: [place]; Service: Thoracic; Laterality: Left;  . Video assisted thoracoscopy (vats)/ lobectomy Left 02/23/2015    Procedure: Video assisted thorocoscopy, exploration left chest for post operative bleeding.; Surgeon: Melrose Nakayama, MD; Location: [place]; Service: Thoracic; Laterality: Left;    Family History  Problem Relation Age of Onset  . Anuerysm Father    Social History: reports that he quit smoking about 7 years ago. His smoking use included Cigarettes. He has a 46 pack-year smoking history. He has never used smokeless tobacco. He reports that he drinks about 1.2 oz of alcohol per week. He reports that he does not use illicit drugs.  Allergies: No Known Allergies   (Not in  a hospital admission)   Lab Results Last  48 Hours    No results found for this or any previous visit (from the past 48 hour(s)).    Imaging Results (Last 48 hours)    Dg Chest 2 View  03/05/2015 CLINICAL DATA: Lung mass. Shortness of breath. Chest pain . EXAM: CHEST 2 VIEW COMPARISON: 03/01/2015. FINDINGS: Interim removal right IJ catheter. Cardiomegaly with progressive bilateral pulmonary alveolar infiltrates most consistent with congestive heart failure and pulmonary edema. Bilateral pneumonia cannot be excluded. Bilateral pleural effusions left side greater right noted. No pneumothorax. IMPRESSION: Cardiomegaly with progressive bilateral pulmonary infiltrates. Bilateral pleural effusions. These findings are most consistent with congestive heart failure. Bilateral pneumonia cannot be excluded. Reference is made to prior PET-CT of 02/04/2015 which demonstrated left lower lobe PET positive mass. Electronically Signed By: Marcello Moores Register On: 02/23/2015 13:00     Review of Systems  Constitutional: Positive for malaise/fatigue. Negative for fever, chills and diaphoresis.  HENT: Negative for congestion and sore throat.  Eyes: Negative for blurred vision and double vision.  Respiratory: Positive for cough, hemoptysis, sputum production and shortness of breath. Negative for wheezing and stridor.  Cardiovascular: Positive for leg swelling. Negative for chest pain, palpitations, claudication and PND.  Gastrointestinal: Negative for heartburn, nausea, vomiting, abdominal pain, diarrhea, constipation, blood in stool and melena.  Genitourinary: Negative.  Musculoskeletal: Negative.  Skin: Negative for itching and rash.  Neurological: Positive for weakness. Negative for headaches.  Endo/Heme/Allergies: Negative.  Psychiatric/Behavioral: Negative.    Blood pressure 118/61, pulse 71, resp. rate 22, height 5\' 6"  (1.676 m), weight 192 lb 8 oz (87.317 kg), SpO2 77 %. Physical Exam  Constitutional: He is  oriented to person, place, and time. He appears well-developed. No distress.  HENT:  Head: Normocephalic and atraumatic.  Mouth/Throat: Oropharynx is clear and moist. No oropharyngeal exudate.  Eyes: Conjunctivae and EOM are normal. Pupils are equal, round, and reactive to light. Right eye exhibits no discharge. Left eye exhibits no discharge. No scleral icterus.  Neck: No JVD present. No tracheal deviation present. No thyromegaly present.  Cardiovascular: Normal rate and regular rhythm. Exam reveals friction rub. Exam reveals no gallop.  No murmur heard. Respiratory: He is in respiratory distress. He has no wheezes. He has no rales.  Dim BS in bases GI: Bowel sounds are normal. He exhibits no distension and no mass. There is no tenderness. There is no rebound and no guarding.  Musculoskeletal: He exhibits edema.  Lymphadenopathy:   He has no cervical adenopathy.  Neurological: He is alert and oriented to person, place, and time.  Skin: Skin is warm and dry. He is not diaphoretic.  Psychiatric: He has a normal mood and affect.     Medication List       This list is accurate as of: 03/01/2015 1:58 PM. Always use your most recent med list.              amiodarone 400 MG tablet  Commonly known as: PACERONE  Take 1 tablet (400 mg total) by mouth 2 (two) times daily. For 5 days;then take Amiodarone 200 mg by mouth two times daily for one week;then take Amiodarone 200 mg by mouth daily thereafter     aspirin 81 MG tablet  Take 81 mg by mouth daily.     atorvastatin 20 MG tablet  Commonly known as: LIPITOR  Take 1 tablet (20 mg total) by mouth daily at 6 PM.     buPROPion 75 MG tablet  Commonly  known as: WELLBUTRIN  Take 150 mg by mouth 2 (two) times daily.     dabigatran 150 MG Caps capsule  Commonly known as: PRADAXA  Take 1 capsule (150 mg total) by mouth every 12 (twelve) hours.      Fish Oil 1000 MG Caps  Take 1,000 mg by mouth daily.     FLUoxetine 20 MG capsule  Commonly known as: PROZAC  Take 20 mg by mouth every morning.     GLUCOSAMINE PO  Take by mouth daily.     metoprolol tartrate 25 MG tablet  Commonly known as: LOPRESSOR  Take 0.5 tablets (12.5 mg total) by mouth 2 (two) times daily.     oxyCODONE 5 MG immediate release tablet  Commonly known as: Oxy IR/ROXICODONE  Take 1-2 tablets (5-10 mg total) by mouth every 3 (three) hours as needed for severe pain.      *these are medications at discharge. The patient reports he is currently not taking aspirin, glucosamine, omega-3 fatty acids and also is not requiring oxycodone.*   Assessment/Plan The patient is short of breath. There are findings on chest x-ray and physical exam consistent with congestive failure as well as possible pneumonia. He will require admission for further management.Jadene Pierini E 03/05/2015, 1:47 PM          Patient seen and examined, agree with above.   Admitting with presumptive diagnosis of pneumonia and will treat with IV Vancomycin and zosyn. There may also be a component of congestive failure so will give IV lasix as well. A third consideration is amiodarone toxicity but I think this is the least likely and will continue it for now to assess response to the above measures. Telemetry bed due to recent a fib.  Revonda Standard Roxan Hockey, MD [facility] [phone]

## 2015-03-06 NOTE — Consult Note (Signed)
CARDIOLOGY CONSULT NOTE   Patient ID: Russell [name] MRN: [id number], DOB/AGE: 71-Nov-1947   Admit date: 03/09/2015 Date of Consult: 03/06/2015   Primary Physician: Cari Caraway, MD Primary Cardiologist: P. Johnsie Cancel, MD   Pt. Profile   69 year old male with a hx of HTN, 50 pack year hx of tobacco abuse (quit in 2009), HLD, AAA, prostate cancer, left sided lung cancer, and AF consulted for eval of SOB.  Problem List  Past Medical History  Diagnosis Date  . Hyperlipidemia   . Depression   . Hypertension   . AAA (abdominal aortic aneurysm)     a. 01/23/15 CT: Infrarenal AAA  - 4.4cm. Monitoted by dr early  . Left ureteral calculus   . Bladder stone   . Prostate cancer     a. T2a N0 M0 Gleason 8 prostate cancer, biopsy w/ 10/12 cores positve including 2 w/ Gleason  & remainder both 7 (4+3) & 6;  b. PSA 5.5;  c. CT and PET w/o evidence for prostate Ca mets.  . Frequency of urination   . Nocturia   . BPH (benign prostatic hypertrophy)   . History of kidney stones   . Arthritis   . Wears glasses   . At risk for sleep apnea     STOP-BANG= 5 SENT TO PCP 02-17-2015  . Cancer of left lung     a. 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 Post-op course complicated by bleeding (hemothorax req chest tube) and afib.  . Atrial fibrillation     a. 02/2015 noted post-op lobectomy;  b. CHA2DS2VASc = 3-->placed on pradaxa (chosen b/c of reversal available and potential for repeat surgeries).    Past Surgical History  Procedure Laterality Date  . Cholecystectomy open  1992  . Cystoscopy/retrograde/ureteroscopy/stone extraction with basket Left 02/19/2015    Procedure: CYSTO WITH BLADDER STONE REMOVAL/LEFT URETEROSCOPY STONE EXTRACTION ;  Surgeon: Irine Seal, MD;  [facility];  Service: Urology;  Laterality: Left;  . Prostate biopsy N/A 02/19/2015    Procedure: BIOPSY TRANSRECTAL ULTRASONIC PROSTATE (TUBP);  Surgeon: Irine Seal, MD;  Location: [facility];  Service: Urology;  Laterality: N/A;  . Video assisted thoracoscopy (vats)/wedge resection Left 02/23/2015    Procedure: LEFT VIDEO ASSISTED THORACOSCOPY ;  Surgeon: Melrose Nakayama, MD;  Location: [place];  Service: Thoracic;  Laterality: Left;  . Lobectomy Left 02/23/2015    Procedure:  LEFT LOWER LOBECTOMY WITH NODE DISSECTION;  Surgeon: Melrose Nakayama, MD;  Location: [place];  Service: Thoracic;  Laterality: Left;  . Lead removal Left 02/23/2015    Procedure: CRYO INTERCOSTAL NERVE BLOCK;  Surgeon: Melrose Nakayama, MD;  Location: [place];  Service: Thoracic;  Laterality: Left;  . Video assisted thoracoscopy (vats)/ lobectomy Left 02/23/2015    Procedure: Video assisted thorocoscopy, exploration left chest for post operative bleeding.;  Surgeon: Melrose Nakayama, MD;  Location: [place];  Service: Thoracic;  Laterality: Left;    Allergies  No Known Allergies  HPI  69 year old male with a hx of HTN, 50 pack year hx of tobacco abuse (quite in 2009), HLD, AAA, prostate cancer, left sided lung cancer, and AF consulted for eval of SOB.  He was recently diagnosed with prostate cancer and has been followed closely by urology.  He was noted on CT of the abd to have an incidental LLL mass.  He was seen by pulmonology with subsequent chest CT showing a 3.5 cm spiculated mass  in the LLL.  F/U PET/CT showed that the LLL mass was hypermetabolic.  He also ha a RUL nodule that is not hypermetabolic.  He was referred to CT surgery and subsequently underwent Left lower lobectomy on 02/23/2015 and found to have non-small cell carcinoma. Post op course was complicated by bleeding/hemothorax requiring re-exploration of left chest via L vats.  He developed paroxysmal a fib and was seen by Dr. Johnsie Cancel and placed on Amiodarone drip and Pradaxa (chosen for reversibility).  He was discharged with Amiodarone 400 bid, Lopressor 12.5 bid, Pradaxa 150mg  bid on 02/27/2015. He initially felt well following  d/c but earlier this week, he developed recurrent DOE, which progressed until his clinic appt with surgery on 3/23.  He was dyspneic during the office visit and CXR suggested chf with question of pna.  He was admitted to [facility] and found be have a low grade fever with leukocytosis. He was treated with one dose of IV lasix and then placed on PO lasix.  Subsequent CXRs confirm pna along with left effusion.  On tele, he has been had frequent runs to tachycardia, which appear to be most consistent with PAF along with runs of PAT, WAP, and MAT.  He is asymptomatic from a rhythm standpoint and breathing has overall improved.  Inpatient Medications  . amiodarone  200 mg Oral Daily  . atorvastatin  20 mg Oral q1800  . buPROPion  150 mg Oral BID  . dabigatran  150 mg Oral Q12H  . docusate sodium  100 mg Oral BID  . FLUoxetine  20 mg Oral Daily  . furosemide  40 mg Oral Daily  . metoprolol tartrate  12.5 mg Oral BID  . piperacillin-tazobactam (ZOSYN)  IV  3.375 g Intravenous Q8H  . potassium chloride  20 mEq Oral BID  . sodium chloride  3 mL Intravenous Q12H  . sodium chloride  3 mL Intravenous Q12H  . vancomycin  1,000 mg Intravenous Q12H   Family History Family History  Problem Relation Age of Onset  . Anuerysm Father     Social History History   Social History  . Marital Status: Married    Spouse Name: N/A  . Number of Children: N/A  . Years of Education: N/A   Occupational History  . Not on file.   Social History Main Topics  . Smoking status: Former Smoker -- 1.00 packs/day for 46 years    Types: Cigarettes    Quit date: 12/13/2007  . Smokeless tobacco: Never Used  . Alcohol Use: 1.2 oz/week    2 Cans of beer, 0 Standard drinks or equivalent per week     Comment: occasional beer  . Drug Use: No  . Sexual Activity: Not on file   Other Topics Concern  . Not on file   Social History Narrative   Retired Holiday representative.  Lives in [place] with wife.     Review of  Systems  General:  No chills, fever, night sweats or weight changes. Complains malaise and fatigue. Cardiovascular:  + dyspnea on exertion. No CP, edema, orthopnea, palpitations, paroxysmal nocturnal dyspnea. Dermatological: No rash, lesions/masses Respiratory: + cough, + dyspnea, + SOB, + sputum production Urologic: No hematuria, dysuria Abdominal:   No nausea, vomiting, diarrhea, bright red blood per rectum, melena, or hematemesis Neurologic:  No visual changes, wkns, changes in mental status.  Physical Exam  Blood pressure 98/50, pulse 64, temperature 98.2 F (36.8 C), temperature source Oral, resp. rate 18, height 5\' 6"  (1.676  m), weight 179 lb 11.2 oz (81.511 kg), SpO2 92 %.   General: Pleasant, NAD Psych: Normal affect. Neuro: Alert and oriented X 3. Moves all extremities spontaneously. HEENT: Normal  Neck: Supple without bruits or JVD. Lungs:  Resp regular and unlabored, Right lung clear to auscultation. diminished breath sound on left ~ 1/2 way up. Heart: Irregular S1/S2, no S3/4/murmurs - heart sounds distant. Abdomen: Soft, non-tender, non-distended, BS + x 4. Surgical dressing on left upper abdomen. Wound clean and dry.  Extremities: No clubbing, cyanosis or edema. DP/PT/Radials 2+ and equal bilaterally.  Labs  Lab Results  Component Value Date   WBC 18.1* 03/06/2015   HGB 9.5* 03/06/2015   HCT 28.9* 03/06/2015   MCV 89.2 03/06/2015   PLT 482* 03/06/2015     Recent Labs Lab 02/28/2015 1658  NA 139  K 4.1  CL 106  CO2 25  BUN 16  CREATININE 1.13  CALCIUM 8.2*  PROT 5.2*  BILITOT 0.9  ALKPHOS 70  ALT 52  AST 35  GLUCOSE 123*   Radiology/Studies  Dg Chest 2 View  03/06/2015   CLINICAL DATA:  Shortness of breath, pneumonia; recent left lobectomy  EXAM: CHEST  2 VIEW  COMPARISON:  PA and lateral chest of March 05, 2015  FINDINGS: The right lung is adequately inflated. On the left there is a small to moderate-sized pleural effusion. Bilateral confluent  airspace opacities in the mid and lower lung zones persist. The left hemidiaphragm and left heart border remain obscured. The pulmonary vascularity is not engorged. The bony thorax exhibits no acute abnormality.  IMPRESSION: Persistent bilateral interstitial and alveolar opacities most compatible with pneumonia. A moderate size left pleural effusion persists.   Electronically Signed   By: David  Martinique   On: 03/06/2015 08:05   Dg Chest 2 View  03/05/2015   CLINICAL DATA:  Pneumonia, shortness of breath. Post left lung mass resection.  EXAM: CHEST  2 VIEW  COMPARISON:  02/22/2015; 03/01/2015; 02/27/2015; 02/23/2015; PET-CT- 02/04/2015; chest CT - 01/29/2015  FINDINGS: Grossly unchanged enlarged cardiac silhouette and mediastinal contours with atherosclerotic plaque within the thoracic aorta. Grossly unchanged extensive bilateral mid and lower lung heterogeneous/consolidative opacities, left greater than right. Unchanged small left-sided effusion. No definite pneumothorax. Unchanged bones. Post cholecystectomy.  IMPRESSION: 1. Similar findings of extensive bilateral heterogeneous airspace opacities, left greater than right with differential considerations again including pulmonary edema, contusion and multifocal infection, including atypical etiologies. 2. Unchanged small left-sided pleural effusion. No definite pneumothorax.   Electronically Signed   By: Sandi Mariscal M.D.   On: 03/05/2015 13:56   Dg Chest 2 View  02/14/2015   CLINICAL DATA:  Lung mass.  Shortness of breath.  Chest pain .  EXAM: CHEST  2 VIEW  COMPARISON:  03/01/2015.  FINDINGS: Interim removal right IJ catheter. Cardiomegaly with progressive bilateral pulmonary alveolar infiltrates most consistent with congestive heart failure and pulmonary edema. Bilateral pneumonia cannot be excluded. Bilateral pleural effusions left side greater right noted. No pneumothorax.  IMPRESSION: Cardiomegaly with progressive bilateral pulmonary infiltrates. Bilateral  pleural effusions. These findings are most consistent with congestive heart failure. Bilateral pneumonia cannot be excluded. Reference is made to prior PET-CT of 02/04/2015 which demonstrated left lower lobe PET positive mass.   Electronically Signed   By: Marcello Moores  Register   On: 02/13/2015 13:00   US Renal  03/05/2015   CLINICAL DATA:  Nephrolithiasis.  EXAM: RENAL/URINARY TRACT ULTRASOUND COMPLETE  COMPARISON:  01/23/2015  FINDINGS: Right Kidney:  Length: 12.4  cm. Echogenicity within normal limits. No mass or hydronephrosis visualized.  Left Kidney:  Length: 12.1 cm. Echogenicity within normal limits. No mass or hydronephrosis visualized.  Bladder:  Appears normal for degree of bladder distention.  IMPRESSION: 1. Normal renal sonogram.   Electronically Signed   By: Kerby Moors M.D.   On: 03/05/2015 16:21   ECG  None performed  - currently pending. Tele shows periods of sinus with PAC's, runs of PAT, and periods of PAF.  ASSESSMENT AND PLAN  1. Acute respiratory failure:  Pt is s/p recent LL lobectomy complicated by post-op bleeding and afib.  Prior to and immediately following discharge, he did well but over the past 4 days, he has been having increasing dyspnea.  CXR showed some degree of vascular congestion along with PNA, thus dyspnea is multifactorial.  Mild volume overload was likely driven by intermittent tachycardia in setting of MAT/PAF, which is likely driven by pna and recent surgery.  To that end, we will increase his amio to 400 mg BID.  Echo pending.  Cont po lasix - wt down to 179, which is a new low for him, making ongoing volume overload unlikely.  F/U creatinine to ensure that he is not getting too dry.  2.  Bilateral PNA:  Abx per surgery.  WBC higher today.  3.  Acute CHF: presumably diastolic in setting of tachycardia as above.  F/U Echo.  See #1.  PAF/atrial tachycardias: Asymptomatic.  As above, tachycardia makes him more prone to volume overload and with underlying  pulmonary issues and pna, he has little reserve.  We will increase amio to 400 bid.  Cont bb. Continue pradaxa 150 mg BID.    4.  HTN:  Stable.  If anything, bp soft.    5.  Prostate CA: Seen by Dr. Jeffie Pollock with plan for eventual either radical prostatectomy or combination XRT and androgen ablation.  Signed, Murray Hodgkins, NP 03/06/2015, 10:56 AM  Patinet seen and examined  I agree with findings of C Berge. Patient with continue atrial arrhythmias.  This is prob contributing to symptoms of dyspnea and CHF   Currently in SR   I would recomm increasing amiodaone as noted to 400 bid.  Continue lasix po.  Treat with ABX  .   WIll continue to follow.  Dorris Carnes

## 2015-03-07 LAB — BASIC METABOLIC PANEL
Anion gap: 8 (ref 5–15)
BUN: 16 mg/dL (ref 6–23)
CO2: 28 mmol/L (ref 19–32)
Calcium: 8 mg/dL — ABNORMAL LOW (ref 8.4–10.5)
Chloride: 103 mmol/L (ref 96–112)
Creatinine, Ser: 1.26 mg/dL (ref 0.50–1.35)
GFR calc non Af Amer: 56 mL/min — ABNORMAL LOW (ref >90)
GFR, EST AFRICAN AMERICAN: 65 mL/min — AB (ref >90)
Glucose, Bld: 137 mg/dL — ABNORMAL HIGH (ref 70–99)
POTASSIUM: 3.6 mmol/L (ref 3.5–5.1)
Sodium: 139 mmol/L (ref 135–145)

## 2015-03-07 LAB — VANCOMYCIN, TROUGH: Vancomycin Tr: 13.6 ug/mL (ref 10.0–20.0)

## 2015-03-07 LAB — TSH: TSH: 0.343 u[IU]/mL — AB (ref 0.350–4.500)

## 2015-03-07 NOTE — Progress Notes (Addendum)
[address]       [place] [address]             [phone]          Subjective: Feeling better, breathing improving  Objective: Vital signs in last 24 hours: Temp:  [97.7 F (36.5 C)-99.2 F (37.3 C)] 97.7 F (36.5 C) (03/26 0702) Pulse Rate:  [67-87] 67 (03/26 0702) Cardiac Rhythm:  [-] Heart block (03/25 1916) Resp:  [18-24] 24 (03/26 0702) BP: (115-121)/(41-51) 115/47 mmHg (03/26 0702) SpO2:  [90 %-100 %] 90 % (03/26 0702) Weight:  [178 lb 6.4 oz (80.922 kg)] 178 lb 6.4 oz (80.922 kg) (03/26 0702)  Hemodynamic parameters for last 24 hours:    Intake/Output from previous day: 03/25 0701 - 03/26 0700 In: 1190 [P.O.:840; IV Piggyback:350] Out: 650 [Urine:650] Intake/Output this shift: Total I/O In: 360 [P.O.:360] Out: 300 [Urine:300]  General appearance: alert, cooperative, fatigued and no distress Heart: regular rate and rhythm Lungs: coarse BS Abdomen: benign Extremities: minimal edema Wound: incis healing well  Lab Results:  Recent Labs  02/27/2015 1658 03/06/15 0540  WBC 14.5* 18.1*  HGB 9.7* 9.5*  HCT 29.7* 28.9*  PLT 406* 482*   BMET:  Recent Labs  03/06/15 1118 03/07/15 0528  NA 139 139  K 3.5 3.6  CL 102 103  CO2 27 28  GLUCOSE 162* 137*  BUN 16 16  CREATININE 1.38* 1.26  CALCIUM 8.0* 8.0*    PT/INR: No results for input(s): LABPROT, INR in the last 72 hours. ABG    Component Value Date/Time   PHART 7.314* 02/24/2015 0405   HCO3 24.3* 02/24/2015 0405   TCO2 26 02/24/2015 0405   ACIDBASEDEF 2.0 02/24/2015 0405   O2SAT 96.0 02/24/2015 0405   CBG (last 3)  No results for input(s): GLUCAP in the last 72 hours.  Meds Scheduled Meds: . amiodarone  400 mg Oral BID  . atorvastatin  20 mg Oral q1800  . buPROPion  150 mg Oral BID  . dabigatran  150 mg Oral Q12H  . docusate sodium  100 mg Oral BID  . FLUoxetine  20 mg Oral Daily  . furosemide  40 mg Oral Daily  . metoprolol tartrate  12.5 mg Oral BID  .  piperacillin-tazobactam (ZOSYN)  IV  3.375 g Intravenous Q8H  . potassium chloride  20 mEq Oral BID  . sodium chloride  3 mL Intravenous Q12H  . sodium chloride  3 mL Intravenous Q12H  . vancomycin  1,000 mg Intravenous Q12H   Continuous Infusions:  PRN Meds:.sodium chloride, acetaminophen **OR** acetaminophen, bisacodyl, oxyCODONE, senna-docusate, sodium chloride  Xrays Dg Chest 2 View  03/06/2015   CLINICAL DATA:  Shortness of breath, pneumonia; recent left lobectomy  EXAM: CHEST  2 VIEW  COMPARISON:  PA and lateral chest of March 05, 2015  FINDINGS: The right lung is adequately inflated. On the left there is a small to moderate-sized pleural effusion. Bilateral confluent airspace opacities in the mid and lower lung zones persist. The left hemidiaphragm and left heart border remain obscured. The pulmonary vascularity is not engorged. The bony thorax exhibits no acute abnormality.  IMPRESSION: Persistent bilateral interstitial and alveolar opacities most compatible with pneumonia. A moderate size left pleural effusion persists.   Electronically Signed   By: David  Martinique   On: 03/06/2015 08:05   Dg Chest 2 View  03/05/2015   CLINICAL DATA:  Pneumonia, shortness of breath. Post left lung mass resection.  EXAM: CHEST  2 VIEW  COMPARISON:  03/05/2015; 03/01/2015; 02/27/2015; 02/23/2015; PET-CT- 02/04/2015; chest CT - 01/29/2015  FINDINGS: Grossly unchanged enlarged cardiac silhouette and mediastinal contours with atherosclerotic plaque within the thoracic aorta. Grossly unchanged extensive bilateral mid and lower lung heterogeneous/consolidative opacities, left greater than right. Unchanged small left-sided effusion. No definite pneumothorax. Unchanged bones. Post cholecystectomy.  IMPRESSION: 1. Similar findings of extensive bilateral heterogeneous airspace opacities, left greater than right with differential considerations again including pulmonary edema, contusion and multifocal infection, including  atypical etiologies. 2. Unchanged small left-sided pleural effusion. No definite pneumothorax.   Electronically Signed   By: Sandi Mariscal M.D.   On: 03/05/2015 13:56   US Renal  03/05/2015   CLINICAL DATA:  Nephrolithiasis.  EXAM: RENAL/URINARY TRACT ULTRASOUND COMPLETE  COMPARISON:  01/23/2015  FINDINGS: Right Kidney:  Length: 12.4 cm. Echogenicity within normal limits. No mass or hydronephrosis visualized.  Left Kidney:  Length: 12.1 cm. Echogenicity within normal limits. No mass or hydronephrosis visualized.  Bladder:  Appears normal for degree of bladder distention.  IMPRESSION: 1. Normal renal sonogram.   Electronically Signed   By: Kerby Moors M.D.   On: 03/05/2015 16:21    Assessment/Plan:  1 slow but steady clinical improvement 2 echo shows EF 50-55% 3 cont abx- sputum culture pending 4 volume status improved with minimal edema now- cont po abx 5 cardiology has been consulted and assisting with management- amiodarone has been increased for atrial tachy-dysrhythmias, hopefully we are not dealing with component of amiodarone toxicity.   LOS: 3 days    Russell [name],Russell [name] 03/07/2015  Patient seen and examined, agree with above He looks and feels much better today I think maintaining SR is helping matters Wean O2 as tolerated  Remo Lipps C. Roxan Hockey, MD [facility] [phone]

## 2015-03-07 NOTE — Progress Notes (Signed)
Patient Name: ANTWOIN LACKEY      SUBJECTIVE: 69 year old man seen in consultation by cardiology 3/26.  He was admitted in early March and underwent left lower lobectomy for non-small cell cancer. Postoperative bleeding requiring reexploration 02/24/15. He was readmitted 3/20 with progressive shortness of breath. This is felt to be multifactorial with possible pneumonia, some congestion and aggravated by recurrent atrial arrhythmias. Amiodarone was increased  Echocardiogram 3/25 demonstrated normal EF. Mild LAE  Feels better today  Unaware of palpitations but did have dyspnea with afib  Past Medical History  Diagnosis Date  . Hyperlipidemia   . Depression   . Hypertension   . AAA (abdominal aortic aneurysm)     a. 01/23/15 CT: Infrarenal AAA  - 4.4cm. Monitoted by dr early  . Left ureteral calculus   . Bladder stone   . Prostate cancer     a. T2a N0 M0 Gleason 8 prostate cancer, biopsy w/ 10/12 cores positve including 2 w/ Gleason  & remainder both 7 (4+3) & 6;  b. PSA 5.5;  c. CT and PET w/o evidence for prostate Ca mets.  . Frequency of urination   . Nocturia   . BPH (benign prostatic hypertrophy)   . History of kidney stones   . Arthritis   . Wears glasses   . At risk for sleep apnea     STOP-BANG= 5 SENT TO PCP 02-17-2015  . Cancer of left lung     a. 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 Post-op course complicated by bleeding (hemothorax req chest tube) and afib.  . Atrial fibrillation     a. 02/2015 noted post-op lobectomy;  b. CHA2DS2VASc = 3-->placed on pradaxa (chosen b/c of reversal available and potential for repeat surgeries).    Scheduled Meds:  Scheduled Meds: . amiodarone  400 mg Oral BID  . atorvastatin  20 mg Oral q1800  . buPROPion  150 mg Oral BID  . dabigatran  150 mg Oral Q12H  . docusate sodium  100 mg Oral BID  . FLUoxetine  20 mg Oral Daily  . furosemide  40 mg Oral Daily  . metoprolol tartrate  12.5 mg Oral BID  .  piperacillin-tazobactam (ZOSYN)  IV  3.375 g Intravenous Q8H  . potassium chloride  20 mEq Oral BID  . sodium chloride  3 mL Intravenous Q12H  . sodium chloride  3 mL Intravenous Q12H  . vancomycin  1,000 mg Intravenous Q12H   Continuous Infusions:  sodium chloride, acetaminophen **OR** acetaminophen, bisacodyl, oxyCODONE, senna-docusate, sodium chloride    PHYSICAL EXAM Filed Vitals:   03/06/15 0949 03/06/15 1500 03/06/15 2136 03/07/15 0702  BP: 98/50 121/41 121/51 115/47  Pulse:  68 87 67  Temp:  98.1 F (36.7 C) 99.2 F (37.3 C) 97.7 F (36.5 C)  TempSrc:  Oral Oral Oral  Resp: 18 18 18 24   Height:      Weight:    178 lb 6.4 oz (80.922 kg)  SpO2: 92% 100% 93% 90%   Well developed and nourished mod resp distress HENT normal Neck supple with JVP-flat Crackly L lung Regular rate and rhythm, no murmurs or gallops Abd-soft with active BS No Clubbing cyanosis edema Skin-warm and dry A & Oriented  Grossly normal sensory and motor function   TELEMETRY: Reviewed telemetry pt in NSR*:    Intake/Output Summary (Last 24 hours) at 03/07/15 1337 Last data filed at 03/07/15 1000  Gross per 24 hour  Intake  1020 ml  Output    950 ml  Net     70 ml    LABS: Basic Metabolic Panel:  Recent Labs Lab 03/08/2015 1658 03/06/15 1118 03/07/15 0528  NA 139 139 139  K 4.1 3.5 3.6  CL 106 102 103  CO2 25 27 28   GLUCOSE 123* 162* 137*  BUN 16 16 16   CREATININE 1.13 1.38* 1.26  CALCIUM 8.2* 8.0* 8.0*   Cardiac Enzymes: No results for input(s): CKTOTAL, CKMB, CKMBINDEX, TROPONINI in the last 72 hours. CBC:  Recent Labs Lab 02/24/2015 1658 03/06/15 0540  WBC 14.5* 18.1*  NEUTROABS 12.0*  --   HGB 9.7* 9.5*  HCT 29.7* 28.9*  MCV 90.8 89.2  PLT 406* 482*   PROTIME: No results for input(s): LABPROT, INR in the last 72 hours. Liver Function Tests:  Recent Labs  02/15/2015 1658  AST 35  ALT 52  ALKPHOS 70  BILITOT 0.9  PROT 5.2*  ALBUMIN 2.3*   No results for  input(s): LIPASE, AMYLASE in the last 72 hours. BNP: BNP (last 3 results)  Recent Labs  03/05/2015 1658  BNP 474.7*     ASSESSMENT AND PLAN:  Active Problems:   A-fib   Post-op pneumonia  Continue amio cehck TSH  Signed, Virl Axe MD  03/07/2015

## 2015-03-07 NOTE — Progress Notes (Signed)
ANTIBIOTIC CONSULT NOTE - INITIAL  Pharmacy Consult for vancomycin, Zosyn Indication: pneumonia  No Known Allergies Patient Measurements: Height: 5\' 6"  (167.6 cm) Weight: 179 lb 11.2 oz (81.511 kg) (c scale) IBW/kg (Calculated) : 63.8 Vital Signs: Temp: 99.2 F (37.3 C) (03/25 2136) Temp Source: Oral (03/25 2136) BP: 121/51 mmHg (03/25 2136) Pulse Rate: 87 (03/25 2136) Labs:  Recent Labs  02/18/2015 1658 03/06/15 0540 03/06/15 1118 03/07/15 0528  WBC 14.5* 18.1*  --   --   HGB 9.7* 9.5*  --   --   PLT 406* 482*  --   --   CREATININE 1.13  --  1.38* 1.26   Estimated Creatinine Clearance: 54.7 mL/min (by C-G formula based on Cr of 1.26).  Recent Labs  03/07/15 0528  VANCOTROUGH 13.6     Microbiology: Recent Results (from the past 720 hour(s))  Surgical pcr screen     Status: None   Collection Time: 02/20/15  2:41 PM  Result Value Ref Range Status   MRSA, PCR NEGATIVE NEGATIVE Final   Staphylococcus aureus NEGATIVE NEGATIVE Final    Comment:        The Xpert SA Assay (FDA approved for NASAL specimens in patients over 78 years of age), is one component of a comprehensive surveillance program.  Test performance has been validated by [facility] for patients greater than or equal to 83 year old. It is not intended to diagnose infection nor to guide or monitor treatment.     Medical History: Past Medical History  Diagnosis Date  . Hyperlipidemia   . Depression   . Hypertension   . AAA (abdominal aortic aneurysm)     a. 01/23/15 CT: Infrarenal AAA  - 4.4cm. Monitoted by dr early  . Left ureteral calculus   . Bladder stone   . Prostate cancer     a. T2a N0 M0 Gleason 8 prostate cancer, biopsy w/ 10/12 cores positve including 2 w/ Gleason  & remainder both 7 (4+3) & 6;  b. PSA 5.5;  c. CT and PET w/o evidence for prostate Ca mets.  . Frequency of urination   . Nocturia   . BPH (benign prostatic hypertrophy)   . History of kidney stones   . Arthritis   .  Wears glasses   . At risk for sleep apnea     STOP-BANG= 5 SENT TO PCP 02-17-2015  . Cancer of left lung     a. 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 Post-op course complicated by bleeding (hemothorax req chest tube) and afib.  . Atrial fibrillation     a. 02/2015 noted post-op lobectomy;  b. CHA2DS2VASc = 3-->placed on pradaxa (chosen b/c of reversal available and potential for repeat surgeries).    Medications:  Anti-infectives    Start     Dose/Rate Route Frequency Ordered Stop   03/05/15 0600  vancomycin (VANCOCIN) IVPB 1000 mg/200 mL premix     1,000 mg 200 mL/hr over 60 Minutes Intravenous Every 12 hours 02/18/2015 1837     03/05/15 0100  piperacillin-tazobactam (ZOSYN) IVPB 3.375 g     3.375 g 12.5 mL/hr over 240 Minutes Intravenous Every 8 hours 03/10/2015 1837     03/09/2015 1630  vancomycin (VANCOCIN) 1,500 mg in sodium chloride 0.9 % 500 mL IVPB     1,500 mg 250 mL/hr over 120 Minutes Intravenous  Once 02/23/2015 1629 03/09/2015 2018   03/10/2015 1630  piperacillin-tazobactam (ZOSYN) IVPB 3.375 g     3.375 g 100 mL/hr  over 30 Minutes Intravenous  Once 03/03/2015 1629 02/25/2015 1741     Assessment: Vancomycin trough = 13.6 mcg/ml on 1gm IV q12h in this  69 y.o with  post-operative pneumonia after recent LLL lobectomy on 02/24/15.  SCr 1.26 with estimated CrCl ~ 54 ml/min.  WBC 18.1 on 3/25.  Goal of Therapy:  Vancomycin trough level 15-20 mcg/ml  Clinical resolution of infection.   Plan:  Increase Vancomycin to 1250 mg IV q12h Continue Zosyn 3.375g IV every 8 hours - extended infusion. Monitor clinical status, renal function, and culture results.   Nicole Cella, RPh Clinical Pharmacist Pager: [phone] 03/07/2015,7:04 AM

## 2015-03-07 NOTE — Progress Notes (Signed)
Patient on 5.5 lit O2 via n/c at rest sats 94%. Patient ambulated  in hall on 6 lit O2 via n/c and Patient became very sob, O2 sats dropped to 70%. Patient returned to room sitting up in chair recovered to 96% over 3 min on 6 lit via n/c.

## 2015-03-08 ENCOUNTER — Encounter (HOSPITAL_COMMUNITY): Payer: Self-pay | Admitting: Radiology

## 2015-03-08 ENCOUNTER — Inpatient Hospital Stay (HOSPITAL_COMMUNITY): Payer: Medicare Other

## 2015-03-08 ENCOUNTER — Other Ambulatory Visit: Payer: Self-pay

## 2015-03-08 LAB — COMPREHENSIVE METABOLIC PANEL
ALBUMIN: 2 g/dL — AB (ref 3.5–5.2)
ALK PHOS: 86 U/L (ref 39–117)
ALT: 118 U/L — AB (ref 0–53)
AST: 78 U/L — ABNORMAL HIGH (ref 0–37)
Anion gap: 10 (ref 5–15)
BUN: 14 mg/dL (ref 6–23)
CHLORIDE: 103 mmol/L (ref 96–112)
CO2: 27 mmol/L (ref 19–32)
Calcium: 8.5 mg/dL (ref 8.4–10.5)
Creatinine, Ser: 1.06 mg/dL (ref 0.50–1.35)
GFR calc Af Amer: 80 mL/min — ABNORMAL LOW (ref >90)
GFR calc non Af Amer: 69 mL/min — ABNORMAL LOW (ref >90)
GLUCOSE: 127 mg/dL — AB (ref 70–99)
Potassium: 3.9 mmol/L (ref 3.5–5.1)
Sodium: 140 mmol/L (ref 135–145)
TOTAL PROTEIN: 5.6 g/dL — AB (ref 6.0–8.3)
Total Bilirubin: 0.9 mg/dL (ref 0.3–1.2)

## 2015-03-08 LAB — CBC WITH DIFFERENTIAL/PLATELET
BASOS ABS: 0 10*3/uL (ref 0.0–0.1)
Basophils Relative: 0 % (ref 0–1)
EOS ABS: 0.7 10*3/uL (ref 0.0–0.7)
Eosinophils Relative: 3 % (ref 0–5)
HEMATOCRIT: 33.2 % — AB (ref 39.0–52.0)
HEMOGLOBIN: 10.8 g/dL — AB (ref 13.0–17.0)
Lymphocytes Relative: 4 % — ABNORMAL LOW (ref 12–46)
Lymphs Abs: 0.8 10*3/uL (ref 0.7–4.0)
MCH: 29.1 pg (ref 26.0–34.0)
MCHC: 32.5 g/dL (ref 30.0–36.0)
MCV: 89.5 fL (ref 78.0–100.0)
MONOS PCT: 8 % (ref 3–12)
Monocytes Absolute: 1.7 10*3/uL — ABNORMAL HIGH (ref 0.1–1.0)
NEUTROS ABS: 17.9 10*3/uL — AB (ref 1.7–7.7)
Neutrophils Relative %: 85 % — ABNORMAL HIGH (ref 43–77)
PLATELETS: 594 10*3/uL — AB (ref 150–400)
RBC: 3.71 MIL/uL — ABNORMAL LOW (ref 4.22–5.81)
RDW: 13.2 % (ref 11.5–15.5)
WBC: 21.2 10*3/uL — ABNORMAL HIGH (ref 4.0–10.5)

## 2015-03-08 LAB — T4, FREE: Free T4: 1.87 ng/dL — ABNORMAL HIGH (ref 0.80–1.80)

## 2015-03-08 LAB — BRAIN NATRIURETIC PEPTIDE: B Natriuretic Peptide: 275.9 pg/mL — ABNORMAL HIGH (ref 0.0–100.0)

## 2015-03-08 MED ORDER — IOHEXOL 350 MG/ML SOLN
80.0000 mL | Freq: Once | INTRAVENOUS | Status: AC | PRN
  Administered 2015-03-08: 80 mL via INTRAVENOUS

## 2015-03-08 MED ORDER — VANCOMYCIN HCL 10 G IV SOLR
1250.0000 mg | Freq: Two times a day (BID) | INTRAVENOUS | Status: DC
  Administered 2015-03-08 – 2015-03-12 (×8): 1250 mg via INTRAVENOUS
  Filled 2015-03-08 (×12): qty 1250

## 2015-03-08 NOTE — Progress Notes (Addendum)
      [address]       [place] [address]             [phone]          Subjective: Feels better but dyspnea is still significant  Objective: Vital signs in last 24 hours: Temp:  [97.8 F (36.6 C)-98.8 F (37.1 C)] 98.5 F (36.9 C) (03/27 0635) Pulse Rate:  [67-79] 67 (03/27 0635) Cardiac Rhythm:  [-] Heart block (03/26 2000) Resp:  [17-22] 18 (03/27 0635) BP: (103-122)/(43-55) 122/55 mmHg (03/27 0635) SpO2:  [92 %-98 %] 94 % (03/27 0635) Weight:  [179 lb 3.2 oz (81.285 kg)] 179 lb 3.2 oz (81.285 kg) (03/27 4536)  Hemodynamic parameters for last 24 hours:    Intake/Output from previous day: 03/26 0701 - 03/27 0700 In: 843 [P.O.:840; I.V.:3] Out: 900 [Urine:900] Intake/Output this shift:    General appearance: alert, cooperative and no distress Heart: regular rate and rhythm Lungs: clear to auscultation bilaterally Abdomen: benign Extremities: no edema Wound: incis healing well  Lab Results:  Recent Labs  03/06/15 0540  WBC 18.1*  HGB 9.5*  HCT 28.9*  PLT 482*   BMET:  Recent Labs  03/06/15 1118 03/07/15 0528  NA 139 139  K 3.5 3.6  CL 102 103  CO2 27 28  GLUCOSE 162* 137*  BUN 16 16  CREATININE 1.38* 1.26  CALCIUM 8.0* 8.0*    PT/INR: No results for input(s): LABPROT, INR in the last 72 hours. ABG    Component Value Date/Time   PHART 7.314* 02/24/2015 0405   HCO3 24.3* 02/24/2015 0405   TCO2 26 02/24/2015 0405   ACIDBASEDEF 2.0 02/24/2015 0405   O2SAT 96.0 02/24/2015 0405   CBG (last 3)  No results for input(s): GLUCAP in the last 72 hours.  Meds Scheduled Meds: . amiodarone  400 mg Oral BID  . atorvastatin  20 mg Oral q1800  . buPROPion  150 mg Oral BID  . dabigatran  150 mg Oral Q12H  . docusate sodium  100 mg Oral BID  . FLUoxetine  20 mg Oral Daily  . furosemide  40 mg Oral Daily  . metoprolol tartrate  12.5 mg Oral BID  . piperacillin-tazobactam (ZOSYN)  IV  3.375 g Intravenous Q8H  . potassium  chloride  20 mEq Oral BID  . sodium chloride  3 mL Intravenous Q12H  . sodium chloride  3 mL Intravenous Q12H  . vancomycin  1,000 mg Intravenous Q12H   Continuous Infusions:  PRN Meds:.sodium chloride, acetaminophen **OR** acetaminophen, bisacodyl, oxyCODONE, senna-docusate, sodium chloride  Xrays No results found.  Assessment/Plan:  1 stable and progressing but dyspnea is still significant issue 2 will recheck labs, CBC and LFT's  and CXR, CT scan may be indicated to better delineate infiltrates( pneumonia vs amiodarone toxicity) 3 conts current management      LOS: 4 days    GOLD,WAYNE E 03/08/2015  Patient seen and examined, agree with above He doesn't look as well today I am going to order a CT as Mr. Girtha Rm suggested above, to see if there is any additional information that can help [place]. Will do PE protocol although I think the risk is relatively low. Will also allow [place] to see if there is an effusion that needs to be drained  Remo Lipps C. Roxan Hockey, MD [facility] [phone]

## 2015-03-08 NOTE — Progress Notes (Signed)
Patient Name: Russell [name] Date of Encounter: 03/08/2015     Active Problems:   A-fib   Post-op pneumonia    SUBJECTIVE  Feeling better. Breathing not back to baseline but much improved.   CURRENT MEDS . amiodarone  400 mg Oral BID  . atorvastatin  20 mg Oral q1800  . buPROPion  150 mg Oral BID  . dabigatran  150 mg Oral Q12H  . docusate sodium  100 mg Oral BID  . FLUoxetine  20 mg Oral Daily  . furosemide  40 mg Oral Daily  . metoprolol tartrate  12.5 mg Oral BID  . piperacillin-tazobactam (ZOSYN)  IV  3.375 g Intravenous Q8H  . potassium chloride  20 mEq Oral BID  . sodium chloride  3 mL Intravenous Q12H  . sodium chloride  3 mL Intravenous Q12H  . vancomycin  1,000 mg Intravenous Q12H    OBJECTIVE  Filed Vitals:   03/07/15 2000 03/07/15 2032 03/07/15 2236 03/08/15 0635  BP:  108/50 103/44 122/55  Pulse:  79 70 67  Temp:  98.8 F (37.1 C) 98.4 F (36.9 C) 98.5 F (36.9 C)  TempSrc:  Oral Oral Oral  Resp: 17 18 19 18   Height:      Weight:    179 lb 3.2 oz (81.285 kg)  SpO2:  98% 92% 94%    Intake/Output Summary (Last 24 hours) at 03/08/15 0925 Last data filed at 03/07/15 2220  Gross per 24 hour  Intake    483 ml  Output    900 ml  Net   -417 ml   Filed Weights   03/06/15 0517 03/07/15 0702 03/08/15 0635  Weight: 179 lb 11.2 oz (81.511 kg) 178 lb 6.4 oz (80.922 kg) 179 lb 3.2 oz (81.285 kg)    PHYSICAL EXAM  General: Pleasant, NAD. Neuro: Alert and oriented X 3. Moves all extremities spontaneously. Psych: Normal affect. HEENT:  Normal  Neck: Supple without bruits or JVD. Lungs:  Resp regular and unlabored, CTA. Heart: RRR no s3, s4, or murmurs. Abdomen: Soft, non-tender, non-distended, BS + x 4.  Extremities: No clubbing, cyanosis or edema. DP/PT/Radials 2+ and equal bilaterally.  Accessory Clinical Findings  CBC  Recent Labs  03/06/15 0540  WBC 18.1*  HGB 9.5*  HCT 28.9*  MCV 89.2  PLT 119*   Basic Metabolic Panel  Recent  Labs  03/06/15 1118 03/07/15 0528  NA 139 139  K 3.5 3.6  CL 102 103  CO2 27 28  GLUCOSE 162* 137*  BUN 16 16  CREATININE 1.38* 1.26  CALCIUM 8.0* 8.0*   Thyroid Function Tests  Recent Labs  03/07/15 1455  TSH 0.343*    TELE NSR. NSVT 8 beat run yesterday  Radiology/Studies  Dg Chest 2 View  03/06/2015   CLINICAL DATA:  Shortness of breath, pneumonia; recent left lobectomy  EXAM: CHEST  2 VIEW  COMPARISON:  PA and lateral chest of March 05, 2015  FINDINGS: The right lung is adequately inflated. On the left there is a small to moderate-sized pleural effusion. Bilateral confluent airspace opacities in the mid and lower lung zones persist. The left hemidiaphragm and left heart border remain obscured. The pulmonary vascularity is not engorged. The bony thorax exhibits no acute abnormality.  IMPRESSION: Persistent bilateral interstitial and alveolar opacities most compatible with pneumonia. A moderate size left pleural effusion persists.   Electronically Signed   By: David  Martinique   On: 03/06/2015 08:05   Dg Chest 2 View  03/05/2015   CLINICAL DATA:  Pneumonia, shortness of breath. Post left lung mass resection.  EXAM: CHEST  2 VIEW  COMPARISON:  03/07/2015; 03/01/2015; 02/27/2015; 02/23/2015; PET-CT- 02/04/2015; chest CT - 01/29/2015  FINDINGS: Grossly unchanged enlarged cardiac silhouette and mediastinal contours with atherosclerotic plaque within the thoracic aorta. Grossly unchanged extensive bilateral mid and lower lung heterogeneous/consolidative opacities, left greater than right. Unchanged small left-sided effusion. No definite pneumothorax. Unchanged bones. Post cholecystectomy.  IMPRESSION: 1. Similar findings of extensive bilateral heterogeneous airspace opacities, left greater than right with differential considerations again including pulmonary edema, contusion and multifocal infection, including atypical etiologies. 2. Unchanged small left-sided pleural effusion. No definite  pneumothorax.   Electronically Signed   By: Sandi Mariscal M.D.   On: 03/05/2015 13:56   Dg Chest 2 View  03/06/2015   CLINICAL DATA:  Lung mass.  Shortness of breath.  Chest pain .  EXAM: CHEST  2 VIEW  COMPARISON:  03/01/2015.  FINDINGS: Interim removal right IJ catheter. Cardiomegaly with progressive bilateral pulmonary alveolar infiltrates most consistent with congestive heart failure and pulmonary edema. Bilateral pneumonia cannot be excluded. Bilateral pleural effusions left side greater right noted. No pneumothorax.  IMPRESSION: Cardiomegaly with progressive bilateral pulmonary infiltrates. Bilateral pleural effusions. These findings are most consistent with congestive heart failure. Bilateral pneumonia cannot be excluded. Reference is made to prior PET-CT of 02/04/2015 which demonstrated left lower lobe PET positive mass.   Electronically Signed   By: Marcello Moores  Register   On: 03/12/2015 13:00   Dg Chest 2 View  03/01/2015   CLINICAL DATA:  Status post left VATS  EXAM: CHEST  2 VIEW  COMPARISON:  02/28/2015  FINDINGS: The left apical pneumothorax seen previously is again identified and stable. Small left-sided pleural effusion is noted as well as infiltrative changes in the left lung base. Patchy right mid lung infiltrate is again noted. The right-sided central venous line is in satisfactory position.  IMPRESSION: Overall stable appearance of the chest. A small left apical pneumothorax is again noted.   Electronically Signed   By: Inez Catalina M.D.   On: 03/01/2015 08:17   Dg Chest 2 View  02/28/2015   CLINICAL DATA:  Subsequent evaluation lobectomy  EXAM: CHEST  2 VIEW  COMPARISON:  02/27/2015  FINDINGS: Moderate cardiac enlargement stable. Central line unchanged. Mild interstitial opacities mid lung zone on the right stable. More extensive airspace opacification inferior half left lung stable. Small to moderate stable left effusion. Postsurgical change left hilum. Small left apical pneumothorax stable.   IMPRESSION: No change when compared to prior study. Persistent left apical pneumothorax. Extensive consolidation left lung base.   Electronically Signed   By: Skipper Cliche M.D.   On: 02/28/2015 07:43   Dg Chest 2 View  02/20/2015   CLINICAL DATA:  Left lung mass.  Preop chest x-ray.  EXAM: CHEST  2 VIEW  COMPARISON:  PET-CT 02/04/2015.CT 01/29/2015.  FINDINGS: Mediastinum and hilar structures normal. Left lung mass again noted and unchanged. No pleural effusion or pneumothorax. Stable mild cardiomegaly. No CHF. No acute bony abnormality.  IMPRESSION: Previously identified PET positive left lower lung mass is again noted and unchanged. No acute cardiopulmonary disease.   Electronically Signed   By: Marcello Moores  Register   On: 02/20/2015 16:47   US Renal  03/05/2015   CLINICAL DATA:  Nephrolithiasis.  EXAM: RENAL/URINARY TRACT ULTRASOUND COMPLETE  COMPARISON:  01/23/2015  FINDINGS: Right Kidney:  Length: 12.4 cm. Echogenicity within normal limits. No mass or hydronephrosis visualized.  Left Kidney:  Length: 12.1 cm. Echogenicity within normal limits. No mass or hydronephrosis visualized.  Bladder:  Appears normal for degree of bladder distention.  IMPRESSION: 1. Normal renal sonogram.   Electronically Signed   By: Kerby Moors M.D.   On: 03/05/2015 16:21   Dg Chest Port 1 View  02/27/2015   CLINICAL DATA:  Lobectomy  EXAM: PORTABLE CHEST - 1 VIEW  COMPARISON:  02/26/2015  FINDINGS: The right jugular central line extends into the cavoatrial junction. There is no change in the tiny left apical pneumothorax. Left base consolidation persists without significant change. Ground-glass central and medial base opacities in the right lung persist unchanged.  IMPRESSION: No change in the tiny left apical pneumothorax. Consolidation in the left base and minor central ground-glass opacities persist without significant interval change.   Electronically Signed   By: Andreas Newport M.D.   On: 02/27/2015 06:11   Dg Chest  Port 1 View  02/26/2015   CLINICAL DATA:  Status post left lower lobectomy and mediastinal dissection chest tube removal.  EXAM: PORTABLE CHEST - 1 VIEW  COMPARISON:  Single view of the chest 02/25/2015.  FINDINGS: Right IJ catheter remains in place. One of two left chest tubes has been removed. Tiny left pneumothorax, 5% or less, is unchanged. There has been some increase in left basilar opacity. Mild atelectasis in the right base is unchanged. There is no right pneumothorax. Cardiomegaly is noted.  IMPRESSION: But status post removal of 1 of 2 left chest tubes. No change in a very small left pneumothorax, less than 5%.  Increased left basilar opacity likely due to effusion and atelectasis.   Electronically Signed   By: Inge Rise M.D.   On: 02/26/2015 07:27   Dg Chest Port 1 View  02/25/2015   CLINICAL DATA:  68 year old male status post VATS and left lung lobectomy. Initial encounter.  EXAM: PORTABLE CHEST - 1 VIEW  COMPARISON:  02/24/2015 and earlier.  FINDINGS: Portable AP upright view at 0533 hours. 2 left chest tubes remain in place. Trace left apical pneumothorax is evident. Stable lung volumes. Stable cardiac size and mediastinal contours. Stable right IJ central line. Mild atelectasis at the right lung base.  IMPRESSION: 1.  Stable lines and tubes. 2. Trace left pneumothorax.  Two left chest tubes remain in place. 3. Right lung base atelectasis.   Electronically Signed   By: Genevie Ann M.D.   On: 02/25/2015 07:37   Dg Chest Port 1 View  02/24/2015   CLINICAL DATA:  Pneumothorax followup  EXAM: PORTABLE CHEST - 1 VIEW  COMPARISON:  02/23/2015  FINDINGS: Two chest tubes remain on the left. No pneumothorax. Small left effusion and left lower lobe atelectasis unchanged. Right lower lobe atelectasis slightly increased  Right jugular catheter tip at the cavoatrial junction.  IMPRESSION: Two chest tubes on the left without pneumothorax. Small left effusion unchanged.  Slight increase in right lower lobe  atelectasis/ infiltrate. No change left lower lobe mild consolidation.   Electronically Signed   By: Franchot Gallo M.D.   On: 02/24/2015 07:39   Dg Chest Port 1 View  02/24/2015   CLINICAL DATA:  Pneumothorax.  Dyspnea.  EXAM: PORTABLE CHEST - 1 VIEW  COMPARISON:  02/23/2015  FINDINGS: Two left chest tubes remain. Right jugular central line extends to the cavoatrial junction. No pneumothorax is evident. Left base consolidation is slightly reduced, now with better aeration of the left base. Right lung remains clear.  IMPRESSION: No significant pneumothorax.  Some improvement in aeration of the left base.   Electronically Signed   By: Andreas Newport M.D.   On: 02/24/2015 03:26   Dg Chest Port 1 View  02/23/2015   CLINICAL DATA:  Pneumothorax post surgery  EXAM: PORTABLE CHEST - 1 VIEW  COMPARISON:  Preoperative study of February 20, 2015  FINDINGS: The lungs are hypoinflated. A chest tube is in place on the left with the tip projecting over the medial aspect of the third rib posteriorly. A second chest tube has its tip in the adjacent location. There is no pneumothorax. There is no significant pleural effusion. Minimal basilar subsegmental atelectasis on the left is present. On the right the interstitial markings are coarse. There is a right internal jugular venous catheter present whose tip projects over the distal SVC. The cardiac silhouette is mildly enlarged. The pulmonary vascularity is not engorged.  IMPRESSION: Two left-sided chest tubes are in place. No pneumothorax or significant pleural effusion is demonstrated. The interstitial markings of both lungs are coarse which likely reflects mild interstitial edema. This appearance is accentuated by bilateral hypoinflation.   Electronically Signed   By: David  Martinique   On: 02/23/2015 17:09    ASSESSMENT AND PLAN  69 year old male with a hx of HTN, 50 pack year hx of tobacco abuse (quit in 2009), HLD, AAA, prostate cancer, left sided lung cancer, and AF  consulted for eval of SOB. He was admitted in early March and underwent left lower lobectomy for non-small cell cancer. Postoperative bleeding requiring reexploration 02/24/15. He was readmitted 3/20 with progressive shortness of breath. This is felt to be multifactorial with possible pneumonia, some congestion and aggravated by recurrent atrial arrhythmias.   Acute respiratory failure:much improved. --  Pt is s/p recent LL lobectomy complicated by post-op bleeding and afib.CXR showed some degree of vascular congestion along with PNA, thus dyspnea is multifactorial. Mild volume overload was likely driven by intermittent tachycardia in setting of MAT/PAF, which is likely driven by pna and recent surgery. Amio increased to 400 mg   Bilateral PNA: Abx per surgery.  Acute CHF: presumably diastolic in setting of tachycardia as above. -- Cont po lasix - wt down to 179, which is a new low for him, making ongoing volume overload unlikely.Creat stable at 1.26. -- Echocardiogram 03/06/15 demonstrated normal EF. Mild LAE  PAF/atrial tachycardias: Now maintaining NSR on increased amio. -- As above, tachycardia makes him more prone to volume overload and with underlying pulmonary issues and pna, he has little reserve. Amio increased to 400 bid. Cont bb. There was some worry for possible amio toxicity. Will defer to MD. -- Continue pradaxa 150 mg BID.  -- TSH slightly low at 0.343. Will order Free T3/T4  HTN: Stable. If anything, bp soft.   Prostate CA: Seen by Dr. Jeffie Pollock with plan for eventual either radical prostatectomy or combination XRT and androgen ablation.  NSVT- 8 beat run noted on tele yesterday. Continue BB   Signed, Eileen Stanford PA-C  Pager [phone]  amio lung toxicity when acute is said to manifest as acute pneumonitis which ihave assoc with high fevers   While possible i think we should look elsewhere  Continue amio and dabigitran

## 2015-03-09 ENCOUNTER — Inpatient Hospital Stay (HOSPITAL_COMMUNITY): Payer: Medicare Other

## 2015-03-09 DIAGNOSIS — J189 Pneumonia, unspecified organism: Secondary | ICD-10-CM

## 2015-03-09 DIAGNOSIS — T462X5A Adverse effect of other antidysrhythmic drugs, initial encounter: Principal | ICD-10-CM

## 2015-03-09 DIAGNOSIS — J9588 Other intraoperative complications of respiratory system, not elsewhere classified: Secondary | ICD-10-CM

## 2015-03-09 DIAGNOSIS — J9601 Acute respiratory failure with hypoxia: Secondary | ICD-10-CM

## 2015-03-09 LAB — T3, FREE: T3, Free: 2.1 pg/mL (ref 2.0–4.4)

## 2015-03-09 MED ORDER — METHYLPREDNISOLONE SODIUM SUCC 125 MG IJ SOLR
60.0000 mg | Freq: Three times a day (TID) | INTRAMUSCULAR | Status: DC
  Administered 2015-03-09 – 2015-03-13 (×12): 60 mg via INTRAVENOUS
  Filled 2015-03-09 (×6): qty 0.96
  Filled 2015-03-09: qty 2
  Filled 2015-03-09 (×2): qty 0.96
  Filled 2015-03-09: qty 2
  Filled 2015-03-09 (×5): qty 0.96

## 2015-03-09 NOTE — Progress Notes (Signed)
      [id number]       [place] [address]             [phone]     Due to findings on CT scan we feel it is best to d/c amiodarone at this time d/t poss amiodarone toxicity.    GOLD,WAYNE E, PA-C

## 2015-03-09 NOTE — Progress Notes (Signed)
Pt had 11-beat run of V-tach at 1051.  Pt asymptomatic and resting. Cardiologist notified.

## 2015-03-09 NOTE — Consult Note (Signed)
Name: Russell [name] MRN: [id number] DOB: 14-Jul-1946    ADMISSION DATE:  03/10/2015 CONSULTATION DATE:  3/28  REFERRING MD :  Roxan Hockey   CHIEF COMPLAINT:  Pulmonary infiltrates, hypoxia   BRIEF PATIENT DESCRIPTION: 69yo male with hx Afib, HTN, newly dx non-small cell lung ca, s/p recent LLLobectomy 3/15.  He was d/c post op and returned 3/23 to the office with progressive SOB.  In office his sats were in 70's on RA and he was tx to [facility].  He was admitted by CVTS with ?HCAP v pulm edema.  He was treated with IV abx and lasix but remained SOB and CT revealed diffuse ground glass opacities, amiodarone was stopped and PCCM consulted for further recs.   SIGNIFICANT EVENTS  CTA chest 3/27>>> NEG PE, Interval development of diffuse bilateral predominately ground-glass and associated consolidative pulmonary opacities which may be secondary to multi focal infection or potentially an inflammatory process such as drug reaction. Hemorrhage could have a similar appearance.  STUDIES:  2D echo 3/25>>>EF 50-55%, trivial pericardiac effusion, small L pleural effusion, trivial MR, mild Pulm regurg   HISTORY OF PRESENT ILLNESS:  69yo male with hx Afib, HTN, newly dx non-small cell lung ca, s/p recent LLLobectomy 3/15.  He was d/c post op and returned 3/23 to the office with progressive SOB.  In office his sats were in 70's on RA and he was tx to [facility].  He was admitted by CVTS with ?HCAP v pulm edema.  He was treated with IV abx and lasix but remained SOB and CT revealed diffuse ground glass opacities, amiodarone was stopped and PCCM consulted for further recs.  Currently feeling some better but remains easily SOB with minimal activity, has occasional cough but denies purulent sputum.  Denies chest pain, post-op pain, fevers, chills, leg/calf pain, edema, orthopnea.   PAST MEDICAL HISTORY :   has a past medical history of Hyperlipidemia; Depression; Hypertension; AAA (abdominal aortic  aneurysm); Left ureteral calculus; Bladder stone; Prostate cancer; Frequency of urination; Nocturia; BPH (benign prostatic hypertrophy); History of kidney stones; Arthritis; Wears glasses; At risk for sleep apnea; Cancer of left lung; and Atrial fibrillation.  has past surgical history that includes Cholecystectomy open (1992); Cystoscopy/retrograde/ureteroscopy/stone extraction with basket (Left, 02/19/2015); Prostate biopsy (N/A, 02/19/2015); Video assisted thoracoscopy (vats)/wedge resection (Left, 02/23/2015); Lobectomy (Left, 02/23/2015); Lead removal (Left, 02/23/2015); and Video assisted thoracoscopy (vats)/ lobectomy (Left, 02/23/2015). Prior to Admission medications   Medication Sig Start Date End Date Taking? Authorizing Provider  amiodarone (PACERONE) 400 MG tablet Take 1 tablet (400 mg total) by mouth 2 (two) times daily. For 5 days;then take Amiodarone 200 mg by mouth two times daily for one week;then take Amiodarone 200 mg by mouth daily thereafter Patient taking differently: Take 400 mg by mouth 2 (two) times daily. For 5 days;then take Amiodarone 200 mg by mouth two times daily for one week;then take Amiodarone 200 mg by mouth daily thereafter Started on 03-02-15. 03/01/15  Yes Donielle Liston Alba, PA-C  aspirin 81 MG tablet Take 81 mg by mouth daily.   Yes Historical Provider, MD  atorvastatin (LIPITOR) 20 MG tablet Take 1 tablet (20 mg total) by mouth daily at 6 PM. 03/01/15  Yes Donielle Liston Alba, PA-C  buPROPion (WELLBUTRIN) 75 MG tablet Take 150 mg by mouth 2 (two) times daily.    Yes Historical Provider, MD  dabigatran (PRADAXA) 150 MG CAPS capsule Take 1 capsule (150 mg total) by mouth every 12 (twelve) hours. 03/01/15  Yes  Donielle Liston Alba, PA-C  FLUoxetine (PROZAC) 20 MG capsule Take 20 mg by mouth every morning.    Yes Historical Provider, MD  GLUCOSAMINE PO Take by mouth daily.    Yes Historical Provider, MD  metoprolol tartrate (LOPRESSOR) 25 MG tablet Take 0.5 tablets (12.5  mg total) by mouth 2 (two) times daily. 03/01/15  Yes Donielle Liston Alba, PA-C  Omega-3 Fatty Acids (FISH OIL) 1000 MG CAPS Take 1,000 mg by mouth daily.   Yes Historical Provider, MD  losartan (COZAAR) 50 MG tablet Take 50 mg by mouth daily. 03/03/15   Historical Provider, MD  oxyCODONE (OXY IR/ROXICODONE) 5 MG immediate release tablet Take 1-2 tablets (5-10 mg total) by mouth every 3 (three) hours as needed for severe pain. Patient not taking: Reported on 02/10/2015 03/01/15   Nani Skillern, PA-C   No Known Allergies  FAMILY HISTORY:  family history includes Anuerysm in his father. SOCIAL HISTORY:  reports that he quit smoking about 7 years ago. His smoking use included Cigarettes. He has a 46 pack-year smoking history. He has never used smokeless tobacco. He reports that he drinks about 1.2 oz of alcohol per week. He reports that he does not use illicit drugs.  REVIEW OF SYSTEMS:   As per HPI - All other systems reviewed and were neg.    SUBJECTIVE:   VITAL SIGNS: Temp:  [98.4 F (36.9 C)-98.8 F (37.1 C)] 98.4 F (36.9 C) (03/28 0457) Pulse Rate:  [70-82] 72 (03/28 0959) Resp:  [17-18] 18 (03/28 0457) BP: (110-115)/(42-71) 110/42 mmHg (03/28 0959) SpO2:  [92 %-94 %] 92 % (03/28 0457) Weight:  [178 lb 2.1 oz (80.8 kg)] 178 lb 2.1 oz (80.8 kg) (03/28 0428)  PHYSICAL EXAMINATION: General:  Pleasant, chronically ill appearing male, NAD in bed  Neuro:  Awake, alert, appropriate, MAE  HEENT:  Mm dry, no JVD  Cardiovascular:  s1s2 irreg  Lungs:  resps even non labored at rest, diminished bases otherwise fairly clear, L chest dressing c/d  Abdomen:  Soft, nt, nd, +bs  Musculoskeletal:  Warm and dry, no edema    Recent Labs Lab 03/06/15 1118 03/07/15 0528 03/08/15 1156  NA 139 139 140  K 3.5 3.6 3.9  CL 102 103 103  CO2 27 28 27   BUN 16 16 14   CREATININE 1.38* 1.26 1.06  GLUCOSE 162* 137* 127*    Recent Labs Lab 02/23/2015 1658 03/06/15 0540 03/08/15 1156  HGB  9.7* 9.5* 10.8*  HCT 29.7* 28.9* 33.2*  WBC 14.5* 18.1* 21.2*  PLT 406* 482* 594*   Dg Chest 2 View  03/09/2015   CLINICAL DATA:  Pneumonia.  EXAM: CHEST  2 VIEW  COMPARISON:  03/06/2015, 03/05/2015.  FINDINGS: Stable cardiomegaly. Persistent dense bilateral pulmonary infiltrates again noted without significant interim improvement. Persistent left-sided pleural effusion. No pneumothorax.  IMPRESSION: 1. Persistent dense bilateral pulmonary infiltrates, no significant improvement. Persistent moderate-sized left pleural effusion. 2. Stable cardiomegaly.   Electronically Signed   By: Marcello Moores  Register   On: 03/09/2015 07:59   Ct Angio Chest Pe W/cm &/or Wo Cm  03/08/2015   CLINICAL DATA:  Patient with unexplained shortness of breath for multiple days.  EXAM: CT ANGIOGRAPHY CHEST WITH CONTRAST  TECHNIQUE: Multidetector CT imaging of the chest was performed using the standard protocol during bolus administration of intravenous contrast. Multiplanar CT image reconstructions and MIPs were obtained to evaluate the vascular anatomy.  CONTRAST:  39mL OMNIPAQUE IOHEXOL 350 MG/ML SOLN  COMPARISON:  Chest radiograph 03/06/2015  FINDINGS: Adequate opacification of the main pulmonary artery. No evidence for pulmonary embolism.  Mediastinum/Nodes: Heterogeneous thyroid gland. Interval development of a 14 mm precarinal lymph node (image 43; series 41). There is a 22 mm right hilar lymph node (image 51; series 41). Heart is enlarged. Trace pericardial fluid.  Lungs/Pleura: Postoperative changes compatible with left lower lobectomy. Interval development of bilateral ground-glass and consolidative pulmonary opacities. Small left pleural effusion. No definite pneumothorax. Emphysematous change.  Upper abdomen: Within the left hepatic lobe there is an unchanged 12 mm low-attenuation lesion favored to represent a cyst on prior PET-CT. Post cholecystectomy.  Musculoskeletal: Thoracic spine degenerative changes without aggressive or  acute appearing osseous lesions.  Review of the MIP images confirms the above findings.  IMPRESSION: No evidence for pulmonary embolism.  Interval development of diffuse bilateral predominately ground-glass and associated consolidative pulmonary opacities which may be secondary to multi focal infection or potentially an inflammatory process such as drug reaction. Hemorrhage could have a similar appearance.  Small left pleural effusion.  Interval development of mediastinal and right hilar adenopathy, nonspecific however likely reactive in etiology. Attention on followup is recommended.   Electronically Signed   By: Lovey Newcomer M.D.   On: 03/08/2015 16:31    ASSESSMENT / PLAN:  Acute hypoxic respiratory failure - multifactorial in setting recent LLLobectomy for NSCLCA, mild volume overload which is likely now resolved after diuresis, ?HCAP now with concern for amiodarone toxicity given CT appearance. Amiodarone stopped 3/27.  Does have rising wbc now 21k, but denies purulent sputum or fevers.   REC -  Cont IV abx for ?HCAP Aggressive pulm hygiene  Cont hold amiodarone  Cont diuresis as able  HR control  Will start empiric steroids with ? Amiodarone toxicity  Supplemental O2 as needed    Nickolas Madrid, NP 03/09/2015  10:17 AM Pager: [phone] or [phone]   Patient desaturates with any significant activity.  Patient is likely at dry weight at this point.  Infection is being addressed.  I reviewed the radiographic films myself and are highly suspicious of amiodarone lung.  Discussed with Dr. Roxan Hockey.  Will start IV steroids and monitor progression.  Patient seen and examined, agree with above note.  I dictated the care and orders written for this patient under my direction.  Rush Farmer, MD [phone]

## 2015-03-09 NOTE — Progress Notes (Signed)
Physical Therapy Treatment Patient Details Name: Russell [name] MRN: [id number] DOB: [birth year] Today's Date: 03/09/2015    History of Present Illness 69 y.o. male admitted on 02/22/2015 with post op PNA s/p LT VATS with lobectomy on 02/23/15.    PT Comments    Pt motivated to work with PT and walk in hallway, but de-sat to 66% after 20' on 6L/min and had to turn around and return to room.  Returned to 90% after 6-7 min sitting break and then dropped again to 69% with ambulating around the bed.  MD in to see patient during recovery and they are starting a new medicine.  Spoke to pt and wife and will hold off on any PT tomorrow and then put on schedule for 3/30 to see if new meds help with sats with ambulation, and they were in agreement. MD did ok to increase o2 during exertion.  Pt and wife educated on proper breathing techniques as pt tends to mouth breathe.    Follow Up Recommendations  Home health PT;Supervision for mobility/OOB     Equipment Recommendations  Other (comment) (to be determined )    Recommendations for Other Services       Precautions / Restrictions Precautions Precaution Comments: Monitor O2 Restrictions Weight Bearing Restrictions: No    Mobility  Bed Mobility                  Transfers Overall transfer level: Modified independent                  Ambulation/Gait Ambulation/Gait assistance: Min guard Ambulation Distance (Feet): 40 Feet Assistive device: Rolling walker (2 wheeled)       General Gait Details: Pt ambulated with RW and was motivated to ambulate in hallway, but o2 dropped to 66% on 6 L/min, so returned to EOB.  Took 6-7 mins to return to 90%.  Then ambulated around bed to recliner and o2 dropped to 69%.  While recovering, MD came in and said they would be adding new medicine which shoule help, and okayed increasing o2 with exertion.  o2 increased to 8 L/min and returned to 90%.  Nursing in room for portions of recovery.  Pt was  educated on proper breathing techniques and why gait had to stop.   Stairs            Wheelchair Mobility    Modified Rankin (Stroke Patients Only)       Balance             Standing balance-Leahy Scale: Fair                      Cognition Arousal/Alertness: Awake/alert Behavior During Therapy: WFL for tasks assessed/performed Overall Cognitive Status: Within Functional Limits for tasks assessed                      Exercises      General Comments General comments (skin integrity, edema, etc.): See gait section for details on o2. De-sats quickly on 6L/min.      Pertinent Vitals/Pain      Home Living                      Prior Function            PT Goals (current goals can now be found in the care plan section) Acute Rehab PT Goals Patient Stated Goal: go home PT Goal Formulation: With patient Time  For Goal Achievement: 03/19/15 Potential to Achieve Goals: Good Progress towards PT goals: Not progressing toward goals - comment (unable to ambulate as far as last session due to de-sat)    Frequency  Min 3X/week    PT Plan Current plan remains appropriate    Co-evaluation             End of Session Equipment Utilized During Treatment: Oxygen;Gait belt Activity Tolerance: Treatment limited secondary to medical complications (Comment) Patient left: in chair;with call bell/phone within reach;with family/visitor present;with nursing/sitter in room     Time: 1335-1405 PT Time Calculation (min) (ACUTE ONLY): 30 min  Charges:  $Gait Training: 8-22 mins $Therapeutic Activity: 8-22 mins                    G Codes:      Niza Soderholm LUBECK 03/09/2015, 2:31 PM

## 2015-03-09 NOTE — Significant Event (Signed)
Rapid Response Event Note  Overview: Time Called: 2224 Arrival Time: 1520 Event Type: Respiratory  Initial Focused Assessment:  Called by primary RN for patient with decreasing oxygen saturations.  Upon my arrival to patients room, RN at bedside.  Patient sitting in chair on NRB with 8 lpm oxygen running., sats 99%.  Patient not in any distress, states his breathing is better.  Oxygen switched to [place] 5.5 LPM, patient desatted to 86%,    Interventions: Patient placed on 50% venti mask sats 94%.     Event Summary:  Rn to call if assistance needed   at      at          [facility], [place]

## 2015-03-09 NOTE — Progress Notes (Addendum)
      [address]       [place] [address]             [phone]            Subjective: Patient's wife at chair side. She was inquiring about stopping Amiodarone.  Objective: Vital signs in last 24 hours: Temp:  [98.4 F (36.9 C)-98.8 F (37.1 C)] 98.4 F (36.9 C) (03/28 0457) Pulse Rate:  [70-82] 76 (03/28 0457) Cardiac Rhythm:  [-] Normal sinus rhythm (03/27 2000) Resp:  [17-18] 18 (03/28 0457) BP: (112-115)/(48-71) 112/48 mmHg (03/28 0457) SpO2:  [92 %-94 %] 92 % (03/28 0457) Weight:  [178 lb 2.1 oz (80.8 kg)] 178 lb 2.1 oz (80.8 kg) (03/28 0428)     Intake/Output from previous day: 03/27 0701 - 03/28 0700 In: 720 [P.O.:720] Out: 1250 [Urine:1250]   Physical Exam:  Cardiovascular: RRR Pulmonary: Diminished throughout. Abdomen: Soft, non tender, bowel sounds present. Extremities: No lower extremity edema. Wounds: Clean and dry.  No erythema or signs of infection.   Lab Results: CBC:  Recent Labs  03/08/15 1156  WBC 21.2*  HGB 10.8*  HCT 33.2*  PLT 594*   BMET:   Recent Labs  03/07/15 0528 03/08/15 1156  NA 139 140  K 3.6 3.9  CL 103 103  CO2 28 27  GLUCOSE 137* 127*  BUN 16 14  CREATININE 1.26 1.06  CALCIUM 8.0* 8.5    PT/INR: No results for input(s): LABPROT, INR in the last 72 hours. ABG:  INR: Will add last result for INR, ABG once components are confirmed Will add last 4 CBG results once components are confirmed  Assessment/Plan:  1. CV - SR in the 70's this am. On Pradaxa 150 mg bid, Lopressor 12.5 mg bid. Amiodarone stopped as he may be developing pulmonary toxicity from it.  2.  Pulmonary - On 5 liters of oxygen via [place] this am. CXR shows persistent dense bilateraly pulmonary infiltrates, left moderate effusion, and stable cardiomegaly. Encourage incentive spirometer 3. Anemia-H and H stable at 10.8 and 33.2 yesterday 4.ID-On Vanco and Zosyn for PNA 5. Volume overload (heart failure)-BNP{ down to 275.9  yesterday. On Lasix 40 mg daily. 6. Leukocytosis-WBC up to 21,200 yesterday. Will re check in am.  No wound or GU infection. Being treated for PNA.  ZIMMERMAN,DONIELLE MPA-C 03/09/2015,8:26 AM   Patient seen and examined, agree with above He is not progressing as rapidly as you would expect if purely pneumonia and hisheart failure is improved. I'm concerned he has acute amiodarone pulmonary toxicity. Amiodarone stopped.  Appreciate pulmonary assistance If a fib recurs will treat with alternative medications  Remo Lipps C. Roxan Hockey, MD [facility] [phone]

## 2015-03-09 NOTE — Progress Notes (Signed)
Per rapid response, Langley Gauss states that we may place the Venturi mask on pt up to 12L O2 until pt stabilizes, then may place on [place] at 5.6-6L O2.  Ok to place [place] O2 at 5.5-6L on pt during meals.

## 2015-03-09 NOTE — Progress Notes (Signed)
RT assessed pt on .50 VM HR 70, SPO2 94%.  Pt able to complete sentences with no distress.  Pt is resting comfortably in chair. NAD

## 2015-03-09 NOTE — Progress Notes (Signed)
After pt was ambulated by PT, it was difficult to get pt stabilized with only 5.5L O2 via [place].  Contacted Dr. Nelda Marseille and he ordered to place mask on pt at 8L O2 until stabilized, then replace with [place] at 6L.  After placing non-rebreather at 8L, pt stabilzed to 96%.  Attempted to remove non-rebreather and replace with [place] at 6L, and pt will not stabilize, with O2 sats dropping to the 84.  Replaced non-rebreather, called Rapid Response, and notified Dr. Nelda Marseille.

## 2015-03-09 NOTE — Progress Notes (Signed)
Patient Name: Russell [name] Date of Encounter: 03/09/2015     Active Problems:   A-fib   Post-op pneumonia    SUBJECTIVE  Denies any CP, SOB improving, however mainly noticeable with ambulation. Did have some red tinged sputum few days ago, however has not seen any since. Continue to have cough with deep inspiration.  CURRENT MEDS . atorvastatin  20 mg Oral q1800  . buPROPion  150 mg Oral BID  . dabigatran  150 mg Oral Q12H  . docusate sodium  100 mg Oral BID  . FLUoxetine  20 mg Oral Daily  . furosemide  40 mg Oral Daily  . metoprolol tartrate  12.5 mg Oral BID  . piperacillin-tazobactam (ZOSYN)  IV  3.375 g Intravenous Q8H  . potassium chloride  20 mEq Oral BID  . sodium chloride  3 mL Intravenous Q12H  . sodium chloride  3 mL Intravenous Q12H  . vancomycin  1,250 mg Intravenous Q12H    OBJECTIVE  Filed Vitals:   03/08/15 1330 03/08/15 1957 03/09/15 0428 03/09/15 0457  BP: 115/71 112/54  112/48  Pulse: 82 70  76  Temp: 98.6 F (37 C) 98.8 F (37.1 C)  98.4 F (36.9 C)  TempSrc: Oral Oral  Oral  Resp: 18 17  18   Height:      Weight:   178 lb 2.1 oz (80.8 kg)   SpO2: 94% 94%  92%    Intake/Output Summary (Last 24 hours) at 03/09/15 0848 Last data filed at 03/09/15 0428  Gross per 24 hour  Intake    720 ml  Output   1250 ml  Net   -530 ml   Filed Weights   03/07/15 0702 03/08/15 0635 03/09/15 0428  Weight: 178 lb 6.4 oz (80.922 kg) 179 lb 3.2 oz (81.285 kg) 178 lb 2.1 oz (80.8 kg)    PHYSICAL EXAM  General: Pleasant, NAD. Neuro: Alert and oriented X 3. Moves all extremities spontaneously. Psych: Normal affect. HEENT:  Normal  Neck: Supple without bruits or JVD. Lungs:  Resp regular and unlabored, CTA. Heart: RRR no s3, s4, or murmurs. Abdomen: Soft, non-tender, non-distended, BS + x 4.  Extremities: No clubbing, cyanosis or edema. DP/PT/Radials 2+ and equal bilaterally.  Accessory Clinical Findings  CBC  Recent Labs  03/08/15 1156    WBC 21.2*  NEUTROABS 17.9*  HGB 10.8*  HCT 33.2*  MCV 89.5  PLT 027*   Basic Metabolic Panel  Recent Labs  03/07/15 0528 03/08/15 1156  NA 139 140  K 3.6 3.9  CL 103 103  CO2 28 27  GLUCOSE 137* 127*  BUN 16 14  CREATININE 1.26 1.06  CALCIUM 8.0* 8.5   Liver Function Tests  Recent Labs  03/08/15 1156  AST 78*  ALT 118*  ALKPHOS 86  BILITOT 0.9  PROT 5.6*  ALBUMIN 2.0*   Thyroid Function Tests  Recent Labs  03/07/15 1455 03/08/15 1008  TSH 0.343*  --   T3FREE  --  2.1    TELE NSR with HR 60-70s    ECG  NSR with 1st degree AV block, q wave in inferior leads, poor R wave progression in anterior lead  Echocardiogram 03/06/2015  LV EF: 50% -  55%  ------------------------------------------------------------------- Indications:   Atrial fibrillation - 427.31.  ------------------------------------------------------------------- History:  PMH:  Congestive heart failure. Risk factors: Abdominal aortic aneurysm. Lung mass.  ------------------------------------------------------------------- Study Conclusions  - Left ventricle: The cavity size was moderately dilated. Wall thickness was normal. Systolic  function was normal. The estimated ejection fraction was in the range of 50% to 55%. - Left atrium: The atrium was mildly dilated. - Systemic veins: Echobright density in IVC WOuld recomm abdominal USN to define further. - Pericardium, extracardiac: A trivial pericardial effusion was identified. There was a left pleural effusion.    Radiology/Studies  Dg Chest 2 View  03/09/2015   CLINICAL DATA:  Pneumonia.  EXAM: CHEST  2 VIEW  COMPARISON:  03/06/2015, 03/05/2015.  FINDINGS: Stable cardiomegaly. Persistent dense bilateral pulmonary infiltrates again noted without significant interim improvement. Persistent left-sided pleural effusion. No pneumothorax.  IMPRESSION: 1. Persistent dense bilateral pulmonary infiltrates, no significant  improvement. Persistent moderate-sized left pleural effusion. 2. Stable cardiomegaly.   Electronically Signed   By: Marcello Moores  Register   On: 03/09/2015 07:59   Dg Chest 2 View  03/06/2015   CLINICAL DATA:  Shortness of breath, pneumonia; recent left lobectomy  EXAM: CHEST  2 VIEW  COMPARISON:  PA and lateral chest of March 05, 2015  FINDINGS: The right lung is adequately inflated. On the left there is a small to moderate-sized pleural effusion. Bilateral confluent airspace opacities in the mid and lower lung zones persist. The left hemidiaphragm and left heart border remain obscured. The pulmonary vascularity is not engorged. The bony thorax exhibits no acute abnormality.  IMPRESSION: Persistent bilateral interstitial and alveolar opacities most compatible with pneumonia. A moderate size left pleural effusion persists.   Electronically Signed   By: David  Martinique   On: 03/06/2015 08:05   Dg Chest 2 View  03/05/2015   CLINICAL DATA:  Pneumonia, shortness of breath. Post left lung mass resection.  EXAM: CHEST  2 VIEW  COMPARISON:  03/01/2015; 03/01/2015; 02/27/2015; 02/23/2015; PET-CT- 02/04/2015; chest CT - 01/29/2015  FINDINGS: Grossly unchanged enlarged cardiac silhouette and mediastinal contours with atherosclerotic plaque within the thoracic aorta. Grossly unchanged extensive bilateral mid and lower lung heterogeneous/consolidative opacities, left greater than right. Unchanged small left-sided effusion. No definite pneumothorax. Unchanged bones. Post cholecystectomy.  IMPRESSION: 1. Similar findings of extensive bilateral heterogeneous airspace opacities, left greater than right with differential considerations again including pulmonary edema, contusion and multifocal infection, including atypical etiologies. 2. Unchanged small left-sided pleural effusion. No definite pneumothorax.   Electronically Signed   By: Sandi Mariscal M.D.   On: 03/05/2015 13:56   Dg Chest 2 View  02/21/2015   CLINICAL DATA:  Lung mass.   Shortness of breath.  Chest pain .  EXAM: CHEST  2 VIEW  COMPARISON:  03/01/2015.  FINDINGS: Interim removal right IJ catheter. Cardiomegaly with progressive bilateral pulmonary alveolar infiltrates most consistent with congestive heart failure and pulmonary edema. Bilateral pneumonia cannot be excluded. Bilateral pleural effusions left side greater right noted. No pneumothorax.  IMPRESSION: Cardiomegaly with progressive bilateral pulmonary infiltrates. Bilateral pleural effusions. These findings are most consistent with congestive heart failure. Bilateral pneumonia cannot be excluded. Reference is made to prior PET-CT of 02/04/2015 which demonstrated left lower lobe PET positive mass.   Electronically Signed   By: Marcello Moores  Register   On: 03/10/2015 13:00   Dg Chest 2 View  03/01/2015   CLINICAL DATA:  Status post left VATS  EXAM: CHEST  2 VIEW  COMPARISON:  02/28/2015  FINDINGS: The left apical pneumothorax seen previously is again identified and stable. Small left-sided pleural effusion is noted as well as infiltrative changes in the left lung base. Patchy right mid lung infiltrate is again noted. The right-sided central venous line is in satisfactory position.  IMPRESSION:  Overall stable appearance of the chest. A small left apical pneumothorax is again noted.   Electronically Signed   By: Inez Catalina M.D.   On: 03/01/2015 08:17   Dg Chest 2 View  02/28/2015   CLINICAL DATA:  Subsequent evaluation lobectomy  EXAM: CHEST  2 VIEW  COMPARISON:  02/27/2015  FINDINGS: Moderate cardiac enlargement stable. Central line unchanged. Mild interstitial opacities mid lung zone on the right stable. More extensive airspace opacification inferior half left lung stable. Small to moderate stable left effusion. Postsurgical change left hilum. Small left apical pneumothorax stable.  IMPRESSION: No change when compared to prior study. Persistent left apical pneumothorax. Extensive consolidation left lung base.   Electronically  Signed   By: Skipper Cliche M.D.   On: 02/28/2015 07:43   Dg Chest 2 View  02/20/2015   CLINICAL DATA:  Left lung mass.  Preop chest x-ray.  EXAM: CHEST  2 VIEW  COMPARISON:  PET-CT 02/04/2015.CT 01/29/2015.  FINDINGS: Mediastinum and hilar structures normal. Left lung mass again noted and unchanged. No pleural effusion or pneumothorax. Stable mild cardiomegaly. No CHF. No acute bony abnormality.  IMPRESSION: Previously identified PET positive left lower lung mass is again noted and unchanged. No acute cardiopulmonary disease.   Electronically Signed   By: Marcello Moores  Register   On: 02/20/2015 16:47   Ct Angio Chest Pe W/cm &/or Wo Cm  03/08/2015   CLINICAL DATA:  Patient with unexplained shortness of breath for multiple days.  EXAM: CT ANGIOGRAPHY CHEST WITH CONTRAST  TECHNIQUE: Multidetector CT imaging of the chest was performed using the standard protocol during bolus administration of intravenous contrast. Multiplanar CT image reconstructions and MIPs were obtained to evaluate the vascular anatomy.  CONTRAST:  74mL OMNIPAQUE IOHEXOL 350 MG/ML SOLN  COMPARISON:  Chest radiograph 03/06/2015  FINDINGS: Adequate opacification of the main pulmonary artery. No evidence for pulmonary embolism.  Mediastinum/Nodes: Heterogeneous thyroid gland. Interval development of a 14 mm precarinal lymph node (image 43; series 41). There is a 22 mm right hilar lymph node (image 51; series 41). Heart is enlarged. Trace pericardial fluid.  Lungs/Pleura: Postoperative changes compatible with left lower lobectomy. Interval development of bilateral ground-glass and consolidative pulmonary opacities. Small left pleural effusion. No definite pneumothorax. Emphysematous change.  Upper abdomen: Within the left hepatic lobe there is an unchanged 12 mm low-attenuation lesion favored to represent a cyst on prior PET-CT. Post cholecystectomy.  Musculoskeletal: Thoracic spine degenerative changes without aggressive or acute appearing osseous  lesions.  Review of the MIP images confirms the above findings.  IMPRESSION: No evidence for pulmonary embolism.  Interval development of diffuse bilateral predominately ground-glass and associated consolidative pulmonary opacities which may be secondary to multi focal infection or potentially an inflammatory process such as drug reaction. Hemorrhage could have a similar appearance.  Small left pleural effusion.  Interval development of mediastinal and right hilar adenopathy, nonspecific however likely reactive in etiology. Attention on followup is recommended.   Electronically Signed   By: Lovey Newcomer M.D.   On: 03/08/2015 16:31   US Renal  03/05/2015   CLINICAL DATA:  Nephrolithiasis.  EXAM: RENAL/URINARY TRACT ULTRASOUND COMPLETE  COMPARISON:  01/23/2015  FINDINGS: Right Kidney:  Length: 12.4 cm. Echogenicity within normal limits. No mass or hydronephrosis visualized.  Left Kidney:  Length: 12.1 cm. Echogenicity within normal limits. No mass or hydronephrosis visualized.  Bladder:  Appears normal for degree of bladder distention.  IMPRESSION: 1. Normal renal sonogram.   Electronically Signed   By: Lovena Le  Clovis Riley M.D.   On: 03/05/2015 16:21   Dg Chest Port 1 View  02/27/2015   CLINICAL DATA:  Lobectomy  EXAM: PORTABLE CHEST - 1 VIEW  COMPARISON:  02/26/2015  FINDINGS: The right jugular central line extends into the cavoatrial junction. There is no change in the tiny left apical pneumothorax. Left base consolidation persists without significant change. Ground-glass central and medial base opacities in the right lung persist unchanged.  IMPRESSION: No change in the tiny left apical pneumothorax. Consolidation in the left base and minor central ground-glass opacities persist without significant interval change.   Electronically Signed   By: Andreas Newport M.D.   On: 02/27/2015 06:11   Dg Chest Port 1 View  02/26/2015   CLINICAL DATA:  Status post left lower lobectomy and mediastinal dissection chest tube  removal.  EXAM: PORTABLE CHEST - 1 VIEW  COMPARISON:  Single view of the chest 02/25/2015.  FINDINGS: Right IJ catheter remains in place. One of two left chest tubes has been removed. Tiny left pneumothorax, 5% or less, is unchanged. There has been some increase in left basilar opacity. Mild atelectasis in the right base is unchanged. There is no right pneumothorax. Cardiomegaly is noted.  IMPRESSION: But status post removal of 1 of 2 left chest tubes. No change in a very small left pneumothorax, less than 5%.  Increased left basilar opacity likely due to effusion and atelectasis.   Electronically Signed   By: Inge Rise M.D.   On: 02/26/2015 07:27   Dg Chest Port 1 View  02/25/2015   CLINICAL DATA:  69 year old male status post VATS and left lung lobectomy. Initial encounter.  EXAM: PORTABLE CHEST - 1 VIEW  COMPARISON:  02/24/2015 and earlier.  FINDINGS: Portable AP upright view at 0533 hours. 2 left chest tubes remain in place. Trace left apical pneumothorax is evident. Stable lung volumes. Stable cardiac size and mediastinal contours. Stable right IJ central line. Mild atelectasis at the right lung base.  IMPRESSION: 1.  Stable lines and tubes. 2. Trace left pneumothorax.  Two left chest tubes remain in place. 3. Right lung base atelectasis.   Electronically Signed   By: Genevie Ann M.D.   On: 02/25/2015 07:37   Dg Chest Port 1 View  02/24/2015   CLINICAL DATA:  Pneumothorax followup  EXAM: PORTABLE CHEST - 1 VIEW  COMPARISON:  02/23/2015  FINDINGS: Two chest tubes remain on the left. No pneumothorax. Small left effusion and left lower lobe atelectasis unchanged. Right lower lobe atelectasis slightly increased  Right jugular catheter tip at the cavoatrial junction.  IMPRESSION: Two chest tubes on the left without pneumothorax. Small left effusion unchanged.  Slight increase in right lower lobe atelectasis/ infiltrate. No change left lower lobe mild consolidation.   Electronically Signed   By: Franchot Gallo M.D.   On: 02/24/2015 07:39   Dg Chest Port 1 View  02/24/2015   CLINICAL DATA:  Pneumothorax.  Dyspnea.  EXAM: PORTABLE CHEST - 1 VIEW  COMPARISON:  02/23/2015  FINDINGS: Two left chest tubes remain. Right jugular central line extends to the cavoatrial junction. No pneumothorax is evident. Left base consolidation is slightly reduced, now with better aeration of the left base. Right lung remains clear.  IMPRESSION: No significant pneumothorax. Some improvement in aeration of the left base.   Electronically Signed   By: Andreas Newport M.D.   On: 02/24/2015 03:26   Dg Chest Port 1 View  02/23/2015   CLINICAL DATA:  Pneumothorax post  surgery  EXAM: PORTABLE CHEST - 1 VIEW  COMPARISON:  Preoperative study of February 20, 2015  FINDINGS: The lungs are hypoinflated. A chest tube is in place on the left with the tip projecting over the medial aspect of the third rib posteriorly. A second chest tube has its tip in the adjacent location. There is no pneumothorax. There is no significant pleural effusion. Minimal basilar subsegmental atelectasis on the left is present. On the right the interstitial markings are coarse. There is a right internal jugular venous catheter present whose tip projects over the distal SVC. The cardiac silhouette is mildly enlarged. The pulmonary vascularity is not engorged.  IMPRESSION: Two left-sided chest tubes are in place. No pneumothorax or significant pleural effusion is demonstrated. The interstitial markings of both lungs are coarse which likely reflects mild interstitial edema. This appearance is accentuated by bilateral hypoinflation.   Electronically Signed   By: David  Martinique   On: 02/23/2015 17:09    ASSESSMENT AND PLAN  69 year old male with a hx of HTN, 50 pack year hx of tobacco abuse (quit in 2009), HLD, AAA, prostate cancer, left sided lung cancer, and AF consulted for eval of SOB. 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 Postoperative bleeding requiring reexploration 02/24/15. He was readmitted 3/20 with progressive shortness of breath. This is felt to be multifactorial with possible pneumonia, some congestion and aggravated by recurrent atrial arrhythmias.   1. Acute respiratory failure  - s/p recent LL lobectomy complicated by post-op bleeding and afib.CXR showed some degree of vascular congestion along with PNA, thus dyspnea is multifactorial. Mild volume overload was likely driven by intermittent tachycardia in setting of MAT/PAF, which is likely driven by pna and recent surgery. Amio increased to 400 mg   - CTA 03/08/2015 showed interval development of diffuse bilateral predominately ground glass and consolidative pulm opacities which may represent multi focal infection vs drug reaction vs hemorrhage. Has been treated with abx. Amiodarone subsequently discontinued. WBC increased to 20, afebrile, will monitor.  2. Bilateral PNA: Abx per surgery.  - CT surgery concerned about amio toxicity based on recent CT finding, amiodarone stopped  3. Acute CHF: presumably diastolic in setting of tachycardia as above.  - Cont po lasix - wt down to 179, which is a new low for him, making ongoing volume overload unlikely.Creat stable at 1.26.  - Echocardiogram 03/06/15 demonstrated normal EF. Mild LAE  4. PAF/atrial tachycardias: Now maintaining NSR on increased amio.  - Continue pradaxa 150 mg BID.   - TSH slightly low at 0.343. Free T4 mildly elevated, free T3 normal.  - amiodarone stopped due to concern for potential toxicity. Currently maintaining NSR as it takes several days for amiodarone to clear. Hopefully, he will remain in NSR this many days out of surgery.  5. HTN: Stable. If anything, bp soft.   6. Prostate CA: Seen by Dr. Jeffie Pollock with plan for eventual either radical prostatectomy or combination XRT and androgen ablation.  7. NSVT:   - Addendum: had 11 beats run of VTach this morning.    Hilbert Corrigan PA-C Pager: [phone] Patient seen and examined. I agree with the assessment and plan as detailed above. See also my additional thoughts below.   I personally reviewed telemetry today March 09, 2015. There is sinus rhythm. There were 11 beats of wide complex tachycardia with a rate of approximately 140. Plan to continue the same approach. Dola Argyle, MD, [facility]  03/09/2015 1:11 PM

## 2015-03-10 ENCOUNTER — Encounter (HOSPITAL_COMMUNITY): Payer: Self-pay | Admitting: Physician Assistant

## 2015-03-10 ENCOUNTER — Inpatient Hospital Stay (HOSPITAL_COMMUNITY): Payer: Medicare Other

## 2015-03-10 DIAGNOSIS — R918 Other nonspecific abnormal finding of lung field: Secondary | ICD-10-CM

## 2015-03-10 DIAGNOSIS — R0600 Dyspnea, unspecified: Secondary | ICD-10-CM | POA: Insufficient documentation

## 2015-03-10 DIAGNOSIS — J9601 Acute respiratory failure with hypoxia: Secondary | ICD-10-CM | POA: Insufficient documentation

## 2015-03-10 DIAGNOSIS — J189 Pneumonia, unspecified organism: Secondary | ICD-10-CM | POA: Insufficient documentation

## 2015-03-10 LAB — CBC
HCT: 31.3 % — ABNORMAL LOW (ref 39.0–52.0)
HEMOGLOBIN: 10.1 g/dL — AB (ref 13.0–17.0)
MCH: 28.9 pg (ref 26.0–34.0)
MCHC: 32.3 g/dL (ref 30.0–36.0)
MCV: 89.4 fL (ref 78.0–100.0)
Platelets: 570 10*3/uL — ABNORMAL HIGH (ref 150–400)
RBC: 3.5 MIL/uL — ABNORMAL LOW (ref 4.22–5.81)
RDW: 13.4 % (ref 11.5–15.5)
WBC: 14.3 10*3/uL — AB (ref 4.0–10.5)

## 2015-03-10 LAB — TROPONIN I: Troponin I: <0.03 ng/mL (ref <0.031)

## 2015-03-10 MED ORDER — NITROGLYCERIN 0.4 MG SL SUBL: 0.4000 mg | SUBLINGUAL_TABLET | SUBLINGUAL | Status: DC | PRN

## 2015-03-10 NOTE — Progress Notes (Addendum)
      [address]       [place] [address]             [phone]            Subjective: Patient has pain with swallowing, fatigue, and difficulty breathing.  Objective: Vital signs in last 24 hours: Temp:  [98 F (36.7 C)-98.3 F (36.8 C)] 98.1 F (36.7 C) (03/29 0641) Pulse Rate:  [58-73] 65 (03/29 0641) Cardiac Rhythm:  [-] Normal sinus rhythm (03/28 2026) Resp:  [18-28] 24 (03/29 0641) BP: (104-126)/(42-58) 120/52 mmHg (03/29 0641) SpO2:  [87 %-94 %] 89 % (03/29 0641) FiO2 (%):  [50 %-55 %] 55 % (03/29 0641) Weight:  [177 lb 6.4 oz (80.468 kg)] 177 lb 6.4 oz (80.468 kg) (03/29 0641)     Intake/Output from previous day: 03/28 0701 - 03/29 0700 In: 2230 [P.O.:1020; I.V.:60; IV Piggyback:1150] Out: 1000 [Urine:1000]   Physical Exam:  Cardiovascular: RRR Pulmonary: Tachypneic, diminished throughout Abdomen: Soft, non tender, bowel sounds present. Extremities: No lower extremity edema. Wounds: Clean and dry.  No erythema or signs of infection.   Lab Results: CBC:  Recent Labs  03/08/15 1156 03/10/15 0358  WBC 21.2* 14.3*  HGB 10.8* 10.1*  HCT 33.2* 31.3*  PLT 594* 570*   BMET:   Recent Labs  03/08/15 1156  NA 140  K 3.9  CL 103  CO2 27  GLUCOSE 127*  BUN 14  CREATININE 1.06  CALCIUM 8.5    PT/INR: No results for input(s): LABPROT, INR in the last 72 hours. ABG:  INR: Will add last result for INR, ABG once components are confirmed Will add last 4 CBG results once components are confirmed  Assessment/Plan:  1. CV - SR in the 70's this am. On Pradaxa 150 mg bid, Lopressor 12.5 mg bid.  2.  Pulmonary - Decreased oxygen saturation and requiring increased oxygen requirements-on NRB this am. CXR shows persistent dense bilateraly pulmonary infiltrates, left moderate effusion, and stable cardiomegaly. On Solumedrol 60 IV tid for pulmonary toxicity (related to Amiodarone, which was stopped) 3. Anemia-H and H stable at 10.1 and  31.3  4.ID-On Vanco and Zosyn for PNA 5. Volume overload (heart failure)-BNP{ down to 275.9 yesterday. On Lasix 40 mg daily.  6. Leukocytosis-WBC decreased to 14,300 yesterday.  Being treated for possible PNA.  7. Regarding pain with swallowing, no evidence of thrush on tongue or throat. Monitor 8. Transfer to ICU  ZIMMERMAN,DONIELLE MPA-C 03/10/2015,8:18 AM  Patient seen and examined, agree with above. He feels better this afternoon but is still on non-rebreather mask Continue IV antibiotics- day 5 Vanco and zosyn Continue solumedrol for possible amiodarone pulmonary toxicity I suspect this is related to amiodarone- I added it to his allergy list  Remo Lipps C. Roxan Hockey, MD [facility] [phone]

## 2015-03-10 NOTE — Progress Notes (Signed)
Name: Russell [name] MRN: [id number] DOB: 1946-06-11    ADMISSION DATE:  02/16/2015 CONSULTATION DATE:  3/28  REFERRING MD :  Roxan Hockey   CHIEF COMPLAINT:  Pulmonary infiltrates, hypoxia   BRIEF PATIENT DESCRIPTION: 69yo male with hx Afib, HTN, newly dx non-small cell lung ca, s/p recent LLLobectomy 3/15.  He was d/c post op and returned 3/23 to the office with progressive SOB.  In office his sats were in 70's on RA and he was tx to [facility].  He was admitted by CVTS with ?HCAP v pulm edema.  He was treated with IV abx and lasix but remained SOB and CT revealed diffuse ground glass opacities, amiodarone was stopped and PCCM consulted for further recs.   SIGNIFICANT EVENTS  CTA chest 3/27>>> NEG PE, Interval development of diffuse bilateral predominately ground-glass and associated consolidative pulmonary opacities which may be secondary to multi focal infection or potentially an inflammatory process such as drug reaction. Hemorrhage could have a similar appearance. 3/29 tx to ICU increased FIO2 needs   STUDIES:  2D echo 3/25>>>EF 50-55%, trivial pericardiac effusion, small L pleural effusion, trivial MR, mild Pulm regurg   HISTORY OF PRESENT ILLNESS:  69yo male with hx Afib, HTN, newly dx non-small cell lung ca, s/p recent LLLobectomy 3/15.  He was d/c post op and returned 3/23 to the office with progressive SOB.  In office his sats were in 70's on RA and he was tx to [facility].  He was admitted by CVTS with ?HCAP v pulm edema.  He was treated with IV abx and lasix but remained SOB and CT revealed diffuse ground glass opacities, amiodarone was stopped and PCCM consulted for further recs.  Currently feeling some better but remains easily SOB with minimal activity, has occasional cough but denies purulent sputum.  Denies chest pain, post-op pain, fevers, chills, leg/calf pain, edema, orthopnea.       SUBJECTIVE:   VITAL SIGNS: Temp:  [98 F (36.7 C)-98.3 F (36.8 C)] 98.1 F  (36.7 C) (03/29 0641) Pulse Rate:  [58-73] 65 (03/29 0641) Resp:  [18-28] 24 (03/29 0641) BP: (104-126)/(42-58) 120/52 mmHg (03/29 0641) SpO2:  [87 %-94 %] 89 % (03/29 0641) FiO2 (%):  [50 %-55 %] 55 % (03/29 0641) Weight:  [177 lb 6.4 oz (80.468 kg)] 177 lb 6.4 oz (80.468 kg) (03/29 0641)  PHYSICAL EXAMINATION: General:  Pleasant, chronically ill appearing male, NAD in bed ,on 70% NRB Neuro:  Awake, alert, appropriate, MAE  HEENT:  Mm dry, no JVD  Cardiovascular:  s1s2 irreg  Lungs:  resps even non labored at rest, diminished bases otherwise fairly clear, L chest dressing c/d . On 70% NRB but able to carry on conversation. Abdomen:  Soft, nt, nd, +bs  Musculoskeletal:  Warm and dry, no edema    Recent Labs Lab 03/06/15 1118 03/07/15 0528 03/08/15 1156  NA 139 139 140  K 3.5 3.6 3.9  CL 102 103 103  CO2 27 28 27   BUN 16 16 14   CREATININE 1.38* 1.26 1.06  GLUCOSE 162* 137* 127*    Recent Labs Lab 03/06/15 0540 03/08/15 1156 03/10/15 0358  HGB 9.5* 10.8* 10.1*  HCT 28.9* 33.2* 31.3*  WBC 18.1* 21.2* 14.3*  PLT 482* 594* 570*   Dg Chest 2 View  03/10/2015   CLINICAL DATA:  Shortness of Breath  EXAM: CHEST  2 VIEW  COMPARISON:  03/09/2015  FINDINGS: Cardiac shadow remains enlarged. Bilateral basilar infiltrates are again seen without significant improvement.  Left pleural effusion is again noted. No new focal abnormality is seen.  IMPRESSION: Stable appearance of the chest when compared with the prior exam.   Electronically Signed   By: Inez Catalina M.D.   On: 03/10/2015 07:40   Dg Chest 2 View  03/09/2015   CLINICAL DATA:  Pneumonia.  EXAM: CHEST  2 VIEW  COMPARISON:  03/06/2015, 03/05/2015.  FINDINGS: Stable cardiomegaly. Persistent dense bilateral pulmonary infiltrates again noted without significant interim improvement. Persistent left-sided pleural effusion. No pneumothorax.  IMPRESSION: 1. Persistent dense bilateral pulmonary infiltrates, no significant improvement.  Persistent moderate-sized left pleural effusion. 2. Stable cardiomegaly.   Electronically Signed   By: Marcello Moores  Register   On: 03/09/2015 07:59   Ct Angio Chest Pe W/cm &/or Wo Cm  03/08/2015   CLINICAL DATA:  Patient with unexplained shortness of breath for multiple days.  EXAM: CT ANGIOGRAPHY CHEST WITH CONTRAST  TECHNIQUE: Multidetector CT imaging of the chest was performed using the standard protocol during bolus administration of intravenous contrast. Multiplanar CT image reconstructions and MIPs were obtained to evaluate the vascular anatomy.  CONTRAST:  61mL OMNIPAQUE IOHEXOL 350 MG/ML SOLN  COMPARISON:  Chest radiograph 03/06/2015  FINDINGS: Adequate opacification of the main pulmonary artery. No evidence for pulmonary embolism.  Mediastinum/Nodes: Heterogeneous thyroid gland. Interval development of a 14 mm precarinal lymph node (image 43; series 41). There is a 22 mm right hilar lymph node (image 51; series 41). Heart is enlarged. Trace pericardial fluid.  Lungs/Pleura: Postoperative changes compatible with left lower lobectomy. Interval development of bilateral ground-glass and consolidative pulmonary opacities. Small left pleural effusion. No definite pneumothorax. Emphysematous change.  Upper abdomen: Within the left hepatic lobe there is an unchanged 12 mm low-attenuation lesion favored to represent a cyst on prior PET-CT. Post cholecystectomy.  Musculoskeletal: Thoracic spine degenerative changes without aggressive or acute appearing osseous lesions.  Review of the MIP images confirms the above findings.  IMPRESSION: No evidence for pulmonary embolism.  Interval development of diffuse bilateral predominately ground-glass and associated consolidative pulmonary opacities which may be secondary to multi focal infection or potentially an inflammatory process such as drug reaction. Hemorrhage could have a similar appearance.  Small left pleural effusion.  Interval development of mediastinal and right  hilar adenopathy, nonspecific however likely reactive in etiology. Attention on followup is recommended.   Electronically Signed   By: Lovey Newcomer M.D.   On: 03/08/2015 16:31    ASSESSMENT / PLAN:  Acute hypoxic respiratory failure - multifactorial in setting recent LLLobectomy for NSCLCA, mild volume overload which is likely now resolved after diuresis, ?HCAP now with concern for amiodarone toxicity given CT appearance. Amiodarone stopped 3/27.  Does have rising wbc now 21k, but denies purulent sputum or fevers.  3/29 increasing FIO2 needs  REC -  Cont IV abx for ?HCAP Aggressive pulm hygiene  Cont hold amiodarone  Cont diuresis as able  HR control  Will start empiric steroids with ? Amiodarone toxicity on 3/28 Supplemental O2 as needed  3/29 Tx to ICU for high FIO2 needs. May need MVS in future. Check CxR Follow closely  Richardson Landry Minor ACNP Maryanna Shape PCCM Pager [phone] till 3 pm If no answer page [phone]  Progressive hypoxemic respiratory failure, I high suspect amiodarone toxicity.  Currently requiring 100% NRB, next step is BiPAP should he deteriorate.  Will give an extra dose of lasix today with the hope that diureses will help decrease O2 demand.  Will make BiPAP available PRN.  Transfer to the  ICU.  The patient is critically ill with multiple organ systems failure and requires high complexity decision making for assessment and support, frequent evaluation and titration of therapies, application of advanced monitoring technologies and extensive interpretation of multiple databases.   Critical Care Time devoted to patient care services described in this note is  35  Minutes. This time reflects time of care of this signee Dr Jennet Maduro. This critical care time does not reflect procedure time, or teaching time or supervisory time of PA/NP/Med student/Med Resident etc but could involve care discussion time.  Rush Farmer, M.D. [facility] Pulmonary/Critical Care Medicine. Pager:  [phone]. After hours pager: [phone].  03/10/2015, 9:16 AM

## 2015-03-10 NOTE — Progress Notes (Signed)
I cosign Komicia's medication administration and documentation.

## 2015-03-10 NOTE — Progress Notes (Signed)
ANTIBIOTIC CONSULT NOTE - FOLLOW UP  Pharmacy Consult for Vancomycin and Zosyn Indication: HCAP  No Known Allergies  Patient Measurements: Height: 5\' 6"  (167.6 cm) Weight: 177 lb 6.4 oz (80.468 kg) (bed scale) IBW/kg (Calculated) : 63.8  Vital Signs: Temp: 98.1 F (36.7 C) (03/29 0641) Temp Source: Axillary (03/29 0641) BP: 120/52 mmHg (03/29 0641) Pulse Rate: 65 (03/29 0641) Intake/Output from previous day: 03/28 0701 - 03/29 0700 In: 2230 [P.O.:1020; I.V.:60; IV Piggyback:1150] Out: 1000 [Urine:1000] Intake/Output from this shift:    Labs:  Recent Labs  03/08/15 1156 03/10/15 0358  WBC 21.2* 14.3*  HGB 10.8* 10.1*  PLT 594* 570*  CREATININE 1.06  --    Estimated Creatinine Clearance: 64.7 mL/min (by C-G formula based on Cr of 1.06).  Assessment: 68yom continues on day #7 vancomycin and zosyn for possible HCAP. CXR shows bilateral basilar infiltrates with no significant improvement. Rapid response called yesterday due to decreasing oxygen saturations, he is now on venti mask. No BMET since 3/27 but his renal function has been stable. CCM also suspecting amiodarone lung toxicity.   Vancomycin 3/23>> Zosyn 3/23>> No cultures  Goal of Therapy:  Vancomycin trough level 15-20 mcg/ml  Plan:  1) Continue vancomycin 1250mg  IV q12 2) Continue zosyn 3.375g IV q8 (4 hour infusion) 3) Order BMET for 3/30  Deboraha Sprang 03/10/2015,8:30 AM

## 2015-03-10 NOTE — Progress Notes (Signed)
Patient: Russell [name] / Admit Date: 02/25/2015 / Date of Encounter: 03/10/2015, 12:08 PM   Subjective: Worsened hypoxia and increased O2 requirement this AM requiring NRB. Does endorse 2/10 left sided chest discomfort that comes and goes, some radiation to left arm. He says the pain came on several days ago, intermittent since that time, worse since amio was discontinued on 3/28. It's worse with inspiration but not worse with exertion, movement or palpation. He has occasional chest discomfort after eating but this is somewhat different. Nonlabored breathing currently, able to carry on full conversation. CXR stable appearance with bilateral basilar infiltrates, moderate-sized left pleural effusion.  Objective: Telemetry: NSR Physical Exam: Blood pressure 120/52, pulse 65, temperature 98.1 F (36.7 C), temperature source Axillary, resp. rate 24, height 5\' 6"  (1.676 m), weight 177 lb 6.4 oz (80.468 kg), SpO2 89 %. General: Well developed, well nourished WM in no acute distress. Head: Normocephalic, atraumatic, sclera non-icteric, no xanthomas, nares are without discharge. Neck: JVP not elevated. Lungs: Diminished at left base. Otherwise no significant wheezes, rales, or rhonchi. Breathing is unlabored. Heart: RRR S1 S2 without murmurs, rubs, or gallops.  Abdomen: Soft, non-tender, non-distended with normoactive bowel sounds. No rebound/guarding. Extremities: No clubbing or cyanosis. No edema. Distal pedal pulses are 2+ and equal bilaterally. Neuro: Alert and oriented X 3. Moves all extremities spontaneously. Psych:  Responds to questions appropriately with a normal affect.   Intake/Output Summary (Last 24 hours) at 03/10/15 1208 Last data filed at 03/10/15 0903  Gross per 24 hour  Intake   1990 ml  Output    950 ml  Net   1040 ml    Inpatient Medications:  . atorvastatin  20 mg Oral q1800  . buPROPion  150 mg Oral BID  . dabigatran  150 mg Oral Q12H  . docusate sodium  100 mg Oral  BID  . FLUoxetine  20 mg Oral Daily  . furosemide  40 mg Oral Daily  . methylPREDNISolone (SOLU-MEDROL) injection  60 mg Intravenous 3 times per day  . metoprolol tartrate  12.5 mg Oral BID  . piperacillin-tazobactam (ZOSYN)  IV  3.375 g Intravenous Q8H  . potassium chloride  20 mEq Oral BID  . sodium chloride  3 mL Intravenous Q12H  . sodium chloride  3 mL Intravenous Q12H  . vancomycin  1,250 mg Intravenous Q12H   Infusions:    Labs:  Recent Labs  03/08/15 1156  NA 140  K 3.9  CL 103  CO2 27  GLUCOSE 127*  BUN 14  CREATININE 1.06  CALCIUM 8.5    Recent Labs  03/08/15 1156  AST 78*  ALT 118*  ALKPHOS 86  BILITOT 0.9  PROT 5.6*  ALBUMIN 2.0*    Recent Labs  03/08/15 1156 03/10/15 0358  WBC 21.2* 14.3*  NEUTROABS 17.9*  --   HGB 10.8* 10.1*  HCT 33.2* 31.3*  MCV 89.5 89.4  PLT 594* 570*   No results for input(s): CKTOTAL, CKMB, TROPONINI in the last 72 hours. Invalid input(s): POCBNP No results for input(s): HGBA1C in the last 72 hours.   Radiology/Studies:  Dg Chest 2 View  03/09/2015   CLINICAL DATA:  Pneumonia.  EXAM: CHEST  2 VIEW  COMPARISON:  03/06/2015, 03/05/2015.  FINDINGS: Stable cardiomegaly. Persistent dense bilateral pulmonary infiltrates again noted without significant interim improvement. Persistent left-sided pleural effusion. No pneumothorax.  IMPRESSION: 1. Persistent dense bilateral pulmonary infiltrates, no significant improvement. Persistent moderate-sized left pleural effusion. 2. Stable cardiomegaly.   Electronically Signed  By: [place]   On: 03/09/2015 07:59   Ct Angio Chest Pe W/cm &/or Wo Cm  03/08/2015   CLINICAL DATA:  Patient with unexplained shortness of breath for multiple days.  EXAM: CT ANGIOGRAPHY CHEST WITH CONTRAST  TECHNIQUE: Multidetector CT imaging of the chest was performed using the standard protocol during bolus administration of intravenous contrast. Multiplanar CT image reconstructions and MIPs were  obtained to evaluate the vascular anatomy.  CONTRAST:  39mL OMNIPAQUE IOHEXOL 350 MG/ML SOLN  COMPARISON:  Chest radiograph 03/06/2015  FINDINGS: Adequate opacification of the main pulmonary artery. No evidence for pulmonary embolism.  Mediastinum/Nodes: Heterogeneous thyroid gland. Interval development of a 14 mm precarinal lymph node (image 43; series 41). There is a 22 mm right hilar lymph node (image 51; series 41). Heart is enlarged. Trace pericardial fluid.  Lungs/Pleura: Postoperative changes compatible with left lower lobectomy. Interval development of bilateral ground-glass and consolidative pulmonary opacities. Small left pleural effusion. No definite pneumothorax. Emphysematous change.  Upper abdomen: Within the left hepatic lobe there is an unchanged 12 mm low-attenuation lesion favored to represent a cyst on prior PET-CT. Post cholecystectomy.  Musculoskeletal: Thoracic spine degenerative changes without aggressive or acute appearing osseous lesions.  Review of the MIP images confirms the above findings.  IMPRESSION: No evidence for pulmonary embolism.  Interval development of diffuse bilateral predominately ground-glass and associated consolidative pulmonary opacities which may be secondary to multi focal infection or potentially an inflammatory process such as drug reaction. Hemorrhage could have a similar appearance.  Small left pleural effusion.  Interval development of mediastinal and right hilar adenopathy, nonspecific however likely reactive in etiology. Attention on followup is recommended.   Electronically Signed   By: Lovey Newcomer M.D.   On: 03/08/2015 16:31   US Renal  03/05/2015   CLINICAL DATA:  Nephrolithiasis.  EXAM: RENAL/URINARY TRACT ULTRASOUND COMPLETE  COMPARISON:  01/23/2015  FINDINGS: Right Kidney:  Length: 12.4 cm. Echogenicity within normal limits. No mass or hydronephrosis visualized.  Left Kidney:  Length: 12.1 cm. Echogenicity within normal limits. No mass or hydronephrosis  visualized.  Bladder:  Appears normal for degree of bladder distention.  IMPRESSION: 1. Normal renal sonogram.   Electronically Signed   By: Kerby Moors M.D.   On: 03/05/2015 16:21     Assessment and Plan  69 year old male with a hx of HTN, 50 pack year hx of tobacco abuse (quit in 2009), HLD, AAA, prostate cancer, left sided lung cancer, and AF consulted for eval of SOB. Admitted in early March and underwent left lower lobectomy for non-small cell cancer. Postoperative bleeding requiring reexploration 02/24/15. Admitted 3/20 with progressive shortness of breath, felt multifactorial with possible pneumonia, congestion and aggravated by recurrent atrial arrhythmias.   1. Acute respiratory failure in the setting of lung cancer with possible PNA versus amiodarone toxicity - s/p recent LL lobectomy complicated by post-op bleeding and afib.CXR showed some degree of vascular congestion along with PNA, thus dyspnea is multifactorial. Mild volume overload was likely driven by intermittent tachycardia in setting of MAT/PAF, which is likely driven by PNA and recent surgery. - Amio initially increased, then CTA 03/08/2015 negative for PE, +interval development of diffuse bilateral predominately ground glass and consolidative pulm opacities which may represent multi focal infection vs drug reaction vs hemorrhage. Has been treated with abx. Amiodarone subsequently discontinued. - Resp status declined 03/10/15, PCCM have started empiric steroids - ? Utility of thoracentesis for left pleural effusion  2. Chest pain, somewhat atypical, intermittent -  recent EKGs with nonspecific ST-T changes - repeat 12 lead EKG, cycle troponins, and try SL NTG - anticipate some elevation of troponin with hypoxia but want to follow trajectory given CAD noted on CTA this admission - recent CTA negative for PE, maintained on Pradaxa   3. Acute diastolic CHF - 2D echo 5/49/82: EF 50-55, mildly dilated LA, echobright density in  IVC - report suggests abdominal US to further definite, will d/w MD - renal US unremarkable 3/24 and CTA chest 3/27 showed left hepatic lobe cyst, will d/w MD whether abdominal US is necessary - weight is the lowest it has been in all of EPIC - would keep on oral Lasix at this time  4. PAF/atrial tachycardias - amio discontinued as above - on Pradaxa, metoprolol - possible hyperthyroidism - TSH suppressed at 0.343, free T4 mildly elevated - consider recheck versus endocrine eval - not on supplementation  5. HTN, stable 6. Prostate CA, Seen by Dr. Jeffie Pollock with plan for eventual either radical prostatectomy or combination XRT and androgen ablation. 7. NSVT, continue BB  Signed, Dayna Dunn PA-C Agree with findings by Melina Copa PA-C  Pt transferred from floor / tele to ICU because of increasing SOB and O2 requirements. Issues well outlined in [place] Dunn's PA-Cs note. I have reviewed the data base. LV Fxn nl by 2D. BNP low. He currently is in NSR but has been in AF on AMIO (currently stopped). He is S/P VATS LLLectomy for CA. I agree that his respiratory insufficiency is multifactorial but doubt CHF a signif componant. I worry about amio pulm toxicity. Not sure how much pleural effusion he has but doubt this is large. Agree with ATBX, steroids for ? amio toxicity. Avoid amio. Holding NSR now (AFIB prob a result of pulm issues).     Lorretta Harp, M.D., [place], [facility], [place] [address] [place], [place]  [id number]  [phone] 03/10/2015 1:08 PM

## 2015-03-10 NOTE — Progress Notes (Signed)
Pt's O2 sat dropped to 78% on venturi mask after using urinal. RN placed pt on non-rebreather after O2 sat did not increase past 85%. Current 02 sat = 91% on non-rebreather.

## 2015-03-10 NOTE — Progress Notes (Signed)
Pt ordered to be transferred to 2H03.  Report was called to receiving nurse.  Pt transferred without incident.

## 2015-03-11 ENCOUNTER — Inpatient Hospital Stay (HOSPITAL_COMMUNITY): Payer: Medicare Other

## 2015-03-11 DIAGNOSIS — J90 Pleural effusion, not elsewhere classified: Secondary | ICD-10-CM

## 2015-03-11 LAB — BASIC METABOLIC PANEL
Anion gap: 6 (ref 5–15)
BUN: 31 mg/dL — ABNORMAL HIGH (ref 6–23)
CALCIUM: 8.4 mg/dL (ref 8.4–10.5)
CO2: 27 mmol/L (ref 19–32)
Chloride: 107 mmol/L (ref 96–112)
Creatinine, Ser: 0.69 mg/dL (ref 0.50–1.35)
GFR calc Af Amer: >90 mL/min (ref >90)
GFR calc non Af Amer: >90 mL/min (ref >90)
GLUCOSE: 184 mg/dL — AB (ref 70–99)
Potassium: 4.5 mmol/L (ref 3.5–5.1)
SODIUM: 140 mmol/L (ref 135–145)

## 2015-03-11 LAB — TROPONIN I: TROPONIN I: 0.03 ng/mL (ref <0.031)

## 2015-03-11 LAB — INFLUENZA PANEL BY PCR (TYPE A & B)
H1N1 flu by pcr: NOT DETECTED
Influenza A By PCR: NEGATIVE
Influenza B By PCR: NEGATIVE

## 2015-03-11 NOTE — Progress Notes (Signed)
  Subjective: Breathing about the same desats quickly when off NRB Mild left sided incisional CP  Objective: Vital signs in last 24 hours: Temp:  [97.5 F (36.4 C)-98.5 F (36.9 C)] 97.8 F (36.6 C) (03/30 0730) Pulse Rate:  [59-77] 62 (03/30 0700) Cardiac Rhythm:  [-] Normal sinus rhythm (03/30 0400) Resp:  [23-40] 31 (03/30 0700) BP: (109-158)/(27-69) 131/49 mmHg (03/30 0700) SpO2:  [74 %-98 %] 92 % (03/30 0700) Weight:  [178 lb 2.1 oz (80.8 kg)] 178 lb 2.1 oz (80.8 kg) (03/30 0300)  Hemodynamic parameters for last 24 hours:    Intake/Output from previous day: 03/29 0701 - 03/30 0700 In: 1213 [P.O.:360; I.V.:3; IV Piggyback:850] Out: 1352 [Urine:1350; Stool:2] Intake/Output this shift:    General appearance: alert, cooperative and no distress Neurologic: intact Heart: regular rate and rhythm Lungs: coarse BS bilaterally decreased left base Wound: clean and dry  Lab Results:  Recent Labs  03/08/15 1156 03/10/15 0358  WBC 21.2* 14.3*  HGB 10.8* 10.1*  HCT 33.2* 31.3*  PLT 594* 570*   BMET:  Recent Labs  03/08/15 1156 03/11/15 0235  NA 140 140  K 3.9 4.5  CL 103 107  CO2 27 27  GLUCOSE 127* 184*  BUN 14 31*  CREATININE 1.06 0.69  CALCIUM 8.5 8.4    PT/INR: No results for input(s): LABPROT, INR in the last 72 hours. ABG    Component Value Date/Time   PHART 7.314* 02/24/2015 0405   HCO3 24.3* 02/24/2015 0405   TCO2 26 02/24/2015 0405   ACIDBASEDEF 2.0 02/24/2015 0405   O2SAT 96.0 02/24/2015 0405   CBG (last 3)  No results for input(s): GLUCAP in the last 72 hours.  Assessment/Plan: S/P   -  Acute on chronic respiratory failure Presumed pneumonia- on Vanco and zosyn day 6 Likely amiodarone pulmonary toxicity- amiodarone dc'ed, on solumedrol IV Agree with Dr. Pura Spice plan to try BIPAP at night and possibly some during the day Recheck labs in AM Chest x-ray unchanged Mobilize as tolerated Ruled out for MI On pradaxa for atrial fib   LOS: 7 days    Melrose Nakayama 03/11/2015

## 2015-03-11 NOTE — Progress Notes (Signed)
Name: Russell [name] MRN: [id number] DOB: 1946-03-21    ADMISSION DATE:  02/17/2015 CONSULTATION DATE:  3/28  REFERRING MD :  Russell [name]   CHIEF COMPLAINT:  Pulmonary infiltrates, hypoxia   BRIEF PATIENT DESCRIPTION: 69yo male with hx Afib, HTN, newly dx non-small cell lung ca, s/p recent LLLobectomy 3/15.  He was d/c post op and returned 3/23 to the office with progressive SOB.  In office his sats were in 70's on RA and he was tx to [facility].  He was admitted by CVTS with ?HCAP v pulm edema.  He was treated with IV abx and lasix but remained SOB and CT revealed diffuse ground glass opacities, amiodarone was stopped and PCCM consulted for further recs.   SIGNIFICANT EVENTS  CTA chest 3/27>>> NEG PE, Interval development of diffuse bilateral predominately ground-glass and associated consolidative pulmonary opacities which may be secondary to multi focal infection or potentially an inflammatory process such as drug reaction. Hemorrhage could have a similar appearance. 3/29 tx to ICU increased FIO2 needs   STUDIES:  2D echo 3/25>>>EF 50-55%, trivial pericardiac effusion, small L pleural effusion, trivial MR, mild Pulm regurg  SUBJECTIVE: Feels the same but increased WOB this AM.  VITAL SIGNS: Temp:  [97.5 F (36.4 C)-98.5 F (36.9 C)] 97.8 F (36.6 C) (03/30 0730) Pulse Rate:  [59-77] 62 (03/30 0700) Resp:  [23-40] 31 (03/30 0700) BP: (109-158)/(27-69) 131/49 mmHg (03/30 0700) SpO2:  [74 %-98 %] 92 % (03/30 0700) Weight:  [80.8 kg (178 lb 2.1 oz)] 80.8 kg (178 lb 2.1 oz) (03/30 0300)  PHYSICAL EXAMINATION: General:  Pleasant, chronically ill appearing male, NAD in bed ,on 70% NRB (flaps open) Neuro:  Awake, alert, appropriate, MAE  HEENT:  Mm dry, no JVD  Cardiovascular:  s1s2 irreg  Lungs:  resps even non labored at rest, diminished bases otherwise fairly clear, L chest dressing c/d . On 70% NRB but able to carry on conversation. Abdomen:  Soft, nt, nd, +bs    Musculoskeletal:  Warm and dry, no edema   Recent Labs Lab 03/07/15 0528 03/08/15 1156 03/11/15 0235  NA 139 140 140  K 3.6 3.9 4.5  CL 103 103 107  CO2 28 27 27   BUN 16 14 31*  CREATININE 1.26 1.06 0.69  GLUCOSE 137* 127* 184*   Recent Labs Lab 03/06/15 0540 03/08/15 1156 03/10/15 0358  HGB 9.5* 10.8* 10.1*  HCT 28.9* 33.2* 31.3*  WBC 18.1* 21.2* 14.3*  PLT 482* 594* 570*   Dg Chest 2 View  03/10/2015   CLINICAL DATA:  Shortness of Breath  EXAM: CHEST  2 VIEW  COMPARISON:  03/09/2015  FINDINGS: Cardiac shadow remains enlarged. Bilateral basilar infiltrates are again seen without significant improvement. Left pleural effusion is again noted. No new focal abnormality is seen.  IMPRESSION: Stable appearance of the chest when compared with the prior exam.   Electronically Signed   By: Russell [name] M.D.   On: 03/10/2015 07:40   Dg Chest Port 1 View  03/11/2015   CLINICAL DATA:  Hypoxia and pleural effusion  EXAM: PORTABLE CHEST - 1 VIEW  COMPARISON:  Chest x-ray from yesterday  FINDINGS: Bilateral airspace disease is unchanged. Sparing of the apices, likely from emphysema. There is a small to moderate layering left pleural effusion. The lower heart is obscured but there is no appreciable change in size. Stable aortic and hilar contours, with surgical clips in the left hilum. No evidence of pneumothorax.  IMPRESSION: Stable edema or multifocal  pneumonia with small to moderate left effusion.   Electronically Signed   By: Russell [name] M.D.   On: 03/11/2015 07:27    ASSESSMENT / PLAN:  Acute hypoxic respiratory failure - multifactorial in setting recent LLLobectomy for NSCLCA, mild volume overload which is likely now resolved after diuresis, ?HCAP now with concern for amiodarone toxicity given CT appearance. Amiodarone stopped 3/27.  Does have rising wbc down to 14.3 yesterday, pending today, but denies purulent sputum or fevers.   REC -  Cont IV abx for ?HCAP for now while cultures  result and while O2 demand is that high. Aggressive pulm hygiene. Cont hold amiodarone  Likely at dry weight at this point, continue with PO lasix but no further IV needed HR control as ordered, cards signing off ? Amiodarone toxicity on 3/28, continue solumedrol at 60 mg IV q8 for now. Supplemental O2 as needed  Hold in ICU given rising O2 demand. Check CXR in AM. Follow closely. Will add nocturnal BiPAP to assist with WOB as this AM WOB is higher than anticipated.  The patient is critically ill with multiple organ systems failure and requires high complexity decision making for assessment and support, frequent evaluation and titration of therapies, application of advanced monitoring technologies and extensive interpretation of multiple databases.   Critical Care Time devoted to patient care services described in this note is  35  Minutes. This time reflects time of care of this signee Dr Russell [name]. This critical care time does not reflect procedure time, or teaching time or supervisory time of PA/NP/Med student/Med Resident etc but could involve care discussion time.  Russell [name], M.D. [facility] Pulmonary/Critical Care Medicine. Pager: [phone]. After hours pager: [phone].  03/11/2015, 8:18 AM

## 2015-03-11 NOTE — Progress Notes (Signed)
PT Cancellation Note  Patient Details Name: Russell [name] MRN: [id number] DOB: [birth year]   Cancelled Treatment:    Reason Eval/Treat Not Completed: Medical issues which prohibited therapy (Pt desaturating with minimal movement in bed per nursing.)   [facility] 03/11/2015, 9:37 AM  [facility] PT [phone]

## 2015-03-11 NOTE — Progress Notes (Signed)
Subjective:  Pt says breathing better this AM  Objective:  Temp:  [97.5 F (36.4 C)-98.5 F (36.9 C)] 97.8 F (36.6 C) (03/30 0730) Pulse Rate:  [59-77] 62 (03/30 0700) Resp:  [23-40] 31 (03/30 0700) BP: (109-158)/(27-69) 131/49 mmHg (03/30 0700) SpO2:  [74 %-98 %] 92 % (03/30 0700) Weight:  [178 lb 2.1 oz (80.8 kg)] 178 lb 2.1 oz (80.8 kg) (03/30 0300) Weight change: 11.7 oz (0.332 kg)  Intake/Output from previous day: 03/29 0701 - 03/30 0700 In: 1213 [P.O.:360; I.V.:3; IV Piggyback:850] Out: 1352 [Urine:1350; Stool:2]  Intake/Output from this shift:    Physical Exam: General appearance: alert and no distress Neck: no adenopathy, no carotid bruit, no JVD, supple, symmetrical, trachea midline and thyroid not enlarged, symmetric, no tenderness/mass/nodules Lungs: clear to auscultation bilaterally and Decrease BS LLL Heart: regular rate and rhythm, S1, S2 normal, no murmur, click, rub or gallop Extremities: extremities normal, atraumatic, no cyanosis or edema  Lab Results: Results for orders placed or performed during the hospital encounter of 03/02/2015 (from the past 48 hour(s))  CBC     Status: Abnormal   Collection Time: 03/10/15  3:58 AM  Result Value Ref Range   WBC 14.3 (H) 4.0 - 10.5 K/uL   RBC 3.50 (L) 4.22 - 5.81 MIL/uL   Hemoglobin 10.1 (L) 13.0 - 17.0 g/dL   HCT 31.3 (L) 39.0 - 52.0 %   MCV 89.4 78.0 - 100.0 fL   MCH 28.9 26.0 - 34.0 pg   MCHC 32.3 30.0 - 36.0 g/dL   RDW 13.4 11.5 - 15.5 %   Platelets 570 (H) 150 - 400 K/uL  Troponin I (q 6hr x 3)     Status: None   Collection Time: 03/10/15  1:28 PM  Result Value Ref Range   Troponin I <0.03 <0.031 ng/mL    Comment:        NO INDICATION OF MYOCARDIAL INJURY.   Troponin I (q 6hr x 3)     Status: None   Collection Time: 03/10/15  6:22 PM  Result Value Ref Range   Troponin I <0.03 <0.031 ng/mL    Comment:        NO INDICATION OF MYOCARDIAL INJURY.   Troponin I (q 6hr x 3)     Status: None   Collection Time: 03/11/15 12:44 AM  Result Value Ref Range   Troponin I 0.03 <0.031 ng/mL    Comment:        NO INDICATION OF MYOCARDIAL INJURY.   Basic metabolic panel     Status: Abnormal   Collection Time: 03/11/15  2:35 AM  Result Value Ref Range   Sodium 140 135 - 145 mmol/L   Potassium 4.5 3.5 - 5.1 mmol/L   Chloride 107 96 - 112 mmol/L   CO2 27 19 - 32 mmol/L   Glucose, Bld 184 (H) 70 - 99 mg/dL   BUN 31 (H) 6 - 23 mg/dL   Creatinine, Ser 0.69 0.50 - 1.35 mg/dL   Calcium 8.4 8.4 - 10.5 mg/dL   GFR calc non Af Amer >90 >90 mL/min   GFR calc Af Amer >90 >90 mL/min    Comment: (NOTE) The eGFR has been calculated using the CKD EPI equation. This calculation has not been validated in all clinical situations. eGFR's persistently <90 mL/min signify possible Chronic Kidney Disease.    Anion gap 6 5 - 15    Imaging: Imaging results have been reviewed ( I have personally reviewed the  images  Tele- Maintaining  NSR  Assessment/Plan:   1. Active Problems: 2.   A-fib 3.   Post-op pneumonia 4.   Dyspnea 5.   Pulmonary infiltrates 6.   Pneumonitis 7.   Acute respiratory failure with hypoxemia 8.   Time Spent Directly with Patient:  15 minutes  Length of Stay:  LOS: 7 days   Continues on NRB (15 liters) with sats in low 90%. Seems more comfortable. Maintaining NSR. Labs OK (WBCs elevated). Getting ATBX and Steroids for ? Amio Pulm toxicity. Nothing further to add at this point. Will be avail for further questions.  Lorretta Harp 03/11/2015, 7:56 AM

## 2015-03-11 NOTE — Progress Notes (Addendum)
      [address]       [place] [address]             [phone]            Subjective: Patient able to converse fairly well on NRB  Objective: Vital signs in last 24 hours: Temp:  [97.5 F (36.4 C)-98.5 F (36.9 C)] 97.8 F (36.6 C) (03/30 0730) Pulse Rate:  [59-77] 62 (03/30 0700) Cardiac Rhythm:  [-] Normal sinus rhythm (03/30 0400) Resp:  [23-40] 31 (03/30 0700) BP: (109-158)/(27-69) 131/49 mmHg (03/30 0700) SpO2:  [74 %-98 %] 92 % (03/30 0700) Weight:  [178 lb 2.1 oz (80.8 kg)] 178 lb 2.1 oz (80.8 kg) (03/30 0300)     Intake/Output from previous day: 03/29 0701 - 03/30 0700 In: 1213 [P.O.:360; I.V.:3; IV Piggyback:850] Out: 5364 [Urine:1350; Stool:2]   Physical Exam:  Cardiovascular: RRR Pulmonary: Diminished at bases otherwise clear. Abdomen: Soft, non tender, bowel sounds present. Extremities: No lower extremity edema. Wounds: Clean and dry.  No erythema or signs of infection.   Lab Results: CBC:  Recent Labs  03/08/15 1156 03/10/15 0358  WBC 21.2* 14.3*  HGB 10.8* 10.1*  HCT 33.2* 31.3*  PLT 594* 570*   BMET:   Recent Labs  03/08/15 1156 03/11/15 0235  NA 140 140  K 3.9 4.5  CL 103 107  CO2 27 27  GLUCOSE 127* 184*  BUN 14 31*  CREATININE 1.06 0.69  CALCIUM 8.5 8.4    PT/INR: No results for input(s): LABPROT, INR in the last 72 hours. ABG:  INR: Will add last result for INR, ABG once components are confirmed Will add last 4 CBG results once components are confirmed  Assessment/Plan:  1. CV - SR in the 70's this am. On Pradaxa 150 mg bid, Lopressor 12.5 mg bid.  2.  Pulmonary - On NRB . CXR shows persistent dense bilateraly pulmonary infiltrates, left  effusion, and stable cardiomegaly. On Solumedrol 60 IV tid for pulmonary toxicity (related to Amiodarone, which was stopped). Bipap ordered at bedtime 3. Anemia-H and H stable at 10.1 and 31.3  4.ID-On Vanco and Zosyn (day #6) for PNA 5. Volume overload (heart  failure)-On Lasix 40 mg daily.  6. Remove chest tube sutures  Mailen Newborn MPA-C 03/11/2015,7:50 AM

## 2015-03-12 ENCOUNTER — Inpatient Hospital Stay (HOSPITAL_COMMUNITY): Payer: Medicare Other

## 2015-03-12 DIAGNOSIS — J80 Acute respiratory distress syndrome: Secondary | ICD-10-CM

## 2015-03-12 DIAGNOSIS — A419 Sepsis, unspecified organism: Secondary | ICD-10-CM

## 2015-03-12 DIAGNOSIS — R6521 Severe sepsis with septic shock: Secondary | ICD-10-CM

## 2015-03-12 LAB — BASIC METABOLIC PANEL
Anion gap: 10 (ref 5–15)
BUN: 28 mg/dL — ABNORMAL HIGH (ref 6–23)
CHLORIDE: 105 mmol/L (ref 96–112)
CO2: 25 mmol/L (ref 19–32)
Calcium: 8.2 mg/dL — ABNORMAL LOW (ref 8.4–10.5)
Creatinine, Ser: 1.01 mg/dL (ref 0.50–1.35)
GFR calc Af Amer: 85 mL/min — ABNORMAL LOW (ref >90)
GFR calc non Af Amer: 73 mL/min — ABNORMAL LOW (ref >90)
GLUCOSE: 167 mg/dL — AB (ref 70–99)
Potassium: 4.2 mmol/L (ref 3.5–5.1)
Sodium: 140 mmol/L (ref 135–145)

## 2015-03-12 LAB — CBC
HCT: 32.4 % — ABNORMAL LOW (ref 39.0–52.0)
HEMOGLOBIN: 10.4 g/dL — AB (ref 13.0–17.0)
MCH: 28.4 pg (ref 26.0–34.0)
MCHC: 32.1 g/dL (ref 30.0–36.0)
MCV: 88.5 fL (ref 78.0–100.0)
PLATELETS: 428 10*3/uL — AB (ref 150–400)
RBC: 3.66 MIL/uL — ABNORMAL LOW (ref 4.22–5.81)
RDW: 13.4 % (ref 11.5–15.5)
WBC: 17.8 10*3/uL — ABNORMAL HIGH (ref 4.0–10.5)

## 2015-03-12 LAB — BLOOD GAS, ARTERIAL
Acid-base deficit: 0.4 mmol/L (ref 0.0–2.0)
Bicarbonate: 26.7 mEq/L — ABNORMAL HIGH (ref 20.0–24.0)
DRAWN BY: 42246
FIO2: 100 %
LHR: 20 {breaths}/min
O2 SAT: 81 %
PATIENT TEMPERATURE: 98.6
PEEP/CPAP: 15 cmH2O
PH ART: 7.211 — AB (ref 7.350–7.450)
TCO2: 28.8 mmol/L (ref 0–100)
VT: 580 mL
pCO2 arterial: 69.2 mmHg (ref 35.0–45.0)
pO2, Arterial: 62.2 mmHg — ABNORMAL LOW (ref 80.0–100.0)

## 2015-03-12 LAB — GLUCOSE, CAPILLARY: Glucose-Capillary: 187 mg/dL — ABNORMAL HIGH (ref 70–99)

## 2015-03-12 LAB — VANCOMYCIN, TROUGH: Vancomycin Tr: 14.1 ug/mL (ref 10.0–20.0)

## 2015-03-12 LAB — PHOSPHORUS: PHOSPHORUS: 3.2 mg/dL (ref 2.3–4.6)

## 2015-03-12 LAB — MAGNESIUM: Magnesium: 2.4 mg/dL (ref 1.5–2.5)

## 2015-03-12 MED ORDER — SODIUM CHLORIDE 0.9 % IV SOLN
0.0000 mg/h | INTRAVENOUS | Status: DC
  Filled 2015-03-12: qty 10

## 2015-03-12 MED ORDER — PANTOPRAZOLE SODIUM 40 MG IV SOLR
40.0000 mg | Freq: Every day | INTRAVENOUS | Status: DC
  Administered 2015-03-12 – 2015-03-17 (×6): 40 mg via INTRAVENOUS
  Filled 2015-03-12 (×8): qty 40

## 2015-03-12 MED ORDER — SODIUM CHLORIDE 0.9 % IV SOLN
25.0000 ug/h | INTRAVENOUS | Status: DC
  Filled 2015-03-12: qty 50

## 2015-03-12 MED ORDER — CISATRACURIUM BOLUS VIA INFUSION
2.5000 mg | Freq: Once | INTRAVENOUS | Status: AC
  Administered 2015-03-12: 2.5 mg via INTRAVENOUS
  Filled 2015-03-12: qty 3

## 2015-03-12 MED ORDER — FENTANYL BOLUS VIA INFUSION
50.0000 ug | INTRAVENOUS | Status: DC | PRN
  Filled 2015-03-12: qty 50

## 2015-03-12 MED ORDER — FENTANYL CITRATE 0.05 MG/ML IJ SOLN
50.0000 ug | Freq: Once | INTRAMUSCULAR | Status: AC
  Administered 2015-03-12: 100 ug via INTRAVENOUS

## 2015-03-12 MED ORDER — VANCOMYCIN HCL 10 G IV SOLR
1250.0000 mg | Freq: Two times a day (BID) | INTRAVENOUS | Status: DC
  Administered 2015-03-12: 1250 mg via INTRAVENOUS
  Filled 2015-03-12 (×3): qty 1250

## 2015-03-12 MED ORDER — MIDAZOLAM HCL 2 MG/2ML IJ SOLN
INTRAMUSCULAR | Status: AC
  Administered 2015-03-12: 2 mg
  Filled 2015-03-12: qty 2

## 2015-03-12 MED ORDER — CETYLPYRIDINIUM CHLORIDE 0.05 % MT LIQD
7.0000 mL | Freq: Two times a day (BID) | OROMUCOSAL | Status: DC
  Administered 2015-03-12 – 2015-03-16 (×8): 7 mL via OROMUCOSAL

## 2015-03-12 MED ORDER — FENTANYL CITRATE 0.05 MG/ML IJ SOLN
INTRAMUSCULAR | Status: AC
  Filled 2015-03-12: qty 2

## 2015-03-12 MED ORDER — NOREPINEPHRINE BITARTRATE 1 MG/ML IV SOLN
2.0000 ug/min | INTRAVENOUS | Status: DC
  Administered 2015-03-12: 30 ug/min via INTRAVENOUS
  Administered 2015-03-13: 36 ug/min via INTRAVENOUS
  Administered 2015-03-14: 42 ug/min via INTRAVENOUS
  Administered 2015-03-14: 24 ug/min via INTRAVENOUS
  Administered 2015-03-14: 29.973 ug/min via INTRAVENOUS
  Administered 2015-03-14: 25 ug/min via INTRAVENOUS
  Administered 2015-03-15: 22 ug/min via INTRAVENOUS
  Administered 2015-03-15: 24 ug/min via INTRAVENOUS
  Administered 2015-03-16: 21.973 ug/min via INTRAVENOUS
  Administered 2015-03-17: 30 ug/min via INTRAVENOUS
  Administered 2015-03-18: 35 ug/min via INTRAVENOUS
  Administered 2015-03-18: 50 ug/min via INTRAVENOUS
  Filled 2015-03-12 (×11): qty 16

## 2015-03-12 MED ORDER — SODIUM CHLORIDE 0.9 % IV SOLN
25.0000 ug/h | INTRAVENOUS | Status: DC
  Administered 2015-03-12: 200 ug/h via INTRAVENOUS
  Administered 2015-03-13 – 2015-03-15 (×4): 150 ug/h via INTRAVENOUS
  Administered 2015-03-16 – 2015-03-18 (×3): 200 ug/h via INTRAVENOUS
  Filled 2015-03-12 (×9): qty 50

## 2015-03-12 MED ORDER — ARTIFICIAL TEARS OP OINT
1.0000 "application " | TOPICAL_OINTMENT | Freq: Three times a day (TID) | OPHTHALMIC | Status: DC
  Administered 2015-03-13 – 2015-03-17 (×17): 1 via OPHTHALMIC
  Filled 2015-03-12 (×2): qty 3.5

## 2015-03-12 MED ORDER — ETOMIDATE 2 MG/ML IV SOLN
INTRAVENOUS | Status: AC
  Administered 2015-03-12: 20 mg
  Filled 2015-03-12: qty 10

## 2015-03-12 MED ORDER — MIDAZOLAM BOLUS VIA INFUSION
2.0000 mg | INTRAVENOUS | Status: DC | PRN
  Filled 2015-03-12: qty 2

## 2015-03-12 MED ORDER — SODIUM CHLORIDE 0.9 % IV SOLN: 0.0000 mg/h | INTRAVENOUS | Status: DC

## 2015-03-12 MED ORDER — MIDAZOLAM BOLUS VIA INFUSION
1.0000 mg | INTRAVENOUS | Status: DC | PRN
  Filled 2015-03-12: qty 2

## 2015-03-12 MED ORDER — FENTANYL CITRATE 0.05 MG/ML IJ SOLN
INTRAMUSCULAR | Status: AC
  Administered 2015-03-12: 100 ug
  Filled 2015-03-12: qty 2

## 2015-03-12 MED ORDER — CHLORHEXIDINE GLUCONATE 0.12 % MT SOLN
15.0000 mL | Freq: Two times a day (BID) | OROMUCOSAL | Status: DC
  Administered 2015-03-12 – 2015-03-13 (×3): 15 mL via OROMUCOSAL
  Filled 2015-03-12 (×4): qty 15

## 2015-03-12 MED ORDER — SODIUM CHLORIDE 0.9 % IV SOLN
3.0000 ug/kg/min | INTRAVENOUS | Status: DC
  Administered 2015-03-12: 3 ug/kg/min via INTRAVENOUS
  Administered 2015-03-13: 2.5 ug/kg/min via INTRAVENOUS
  Administered 2015-03-13: 7.5 ug/kg/min via INTRAVENOUS
  Administered 2015-03-14: 3 ug/kg/min via INTRAVENOUS
  Administered 2015-03-14: 3.5 ug/kg/min via INTRAVENOUS
  Administered 2015-03-15 – 2015-03-17 (×3): 3 ug/kg/min via INTRAVENOUS
  Filled 2015-03-12 (×8): qty 20

## 2015-03-12 MED ORDER — CHLORHEXIDINE GLUCONATE 0.12 % MT SOLN
15.0000 mL | Freq: Two times a day (BID) | OROMUCOSAL | Status: DC
  Administered 2015-03-12 – 2015-03-18 (×10): 15 mL via OROMUCOSAL
  Filled 2015-03-12 (×9): qty 15

## 2015-03-12 MED ORDER — CETYLPYRIDINIUM CHLORIDE 0.05 % MT LIQD
7.0000 mL | Freq: Four times a day (QID) | OROMUCOSAL | Status: DC
  Administered 2015-03-13 – 2015-03-18 (×19): 7 mL via OROMUCOSAL

## 2015-03-12 MED ORDER — SODIUM CHLORIDE 0.9 % IV SOLN
1.0000 mg/h | INTRAVENOUS | Status: DC
  Administered 2015-03-12: 2 mg/h via INTRAVENOUS
  Administered 2015-03-13 – 2015-03-15 (×2): 1.5 mg/h via INTRAVENOUS
  Administered 2015-03-16: 2 mg/h via INTRAVENOUS
  Administered 2015-03-17 (×2): 3 mg/h via INTRAVENOUS
  Filled 2015-03-12 (×7): qty 10

## 2015-03-12 MED ORDER — NOREPINEPHRINE BITARTRATE 1 MG/ML IV SOLN
2.0000 ug/min | INTRAVENOUS | Status: DC
  Administered 2015-03-12: 10 ug/min via INTRAVENOUS
  Filled 2015-03-12: qty 4

## 2015-03-12 MED ORDER — FENTANYL BOLUS VIA INFUSION
25.0000 ug | INTRAVENOUS | Status: DC | PRN
  Filled 2015-03-12: qty 25

## 2015-03-12 NOTE — Progress Notes (Addendum)
ANTIBIOTIC CONSULT NOTE - FOLLOW UP  Pharmacy Consult:  Vancomycin / Zosyn Indication:  PNA  Allergies  Allergen Reactions  . Amiodarone Shortness Of Breath    Patient Measurements: Height: 5\' 6"  (167.6 cm) Weight: 179 lb 3.7 oz (81.3 kg) IBW/kg (Calculated) : 63.8  Vital Signs: Temp: 97.4 F (36.3 C) (03/31 1604) Temp Source: Axillary (03/31 1604) BP: 98/60 mmHg (03/31 1858) Pulse Rate: 73 (03/31 1858) Intake/Output from previous day: 03/30 0701 - 03/31 0700 In: 1730 [P.O.:1080; IV Piggyback:650] Out: 1275 [Urine:1275]  Labs:  Recent Labs  03/10/15 0358 03/11/15 0235 03/12/15 0525  WBC 14.3*  --  17.8*  HGB 10.1*  --  10.4*  PLT 570*  --  428*  CREATININE  --  0.69 1.01   Estimated Creatinine Clearance: 68.2 mL/min (by C-G formula based on Cr of 1.01).  Recent Labs  03/12/15 1844  [place] 14.1     Microbiology: Recent Results (from the past 720 hour(s))  Surgical pcr screen     Status: None   Collection Time: 02/20/15  2:41 PM  Result Value Ref Range Status   MRSA, PCR NEGATIVE NEGATIVE Final   Staphylococcus aureus NEGATIVE NEGATIVE Final    Comment:        The Xpert SA Assay (FDA approved for NASAL specimens in patients over 52 years of age), is one component of a comprehensive surveillance program.  Test performance has been validated by [facility] for patients greater than or equal to 68 year old. It is not intended to diagnose infection nor to guide or monitor treatment.      Assessment: 68 YOM with multifocal PNA to continue on vancomycin and Zosyn.  Patient's renal function is fluctuating and currently on the upward trend but urine output remains stable.  Vancomycin trough was drawn ~1.5 hours late yet it was only slightly sub-therapeutic.  It extrapolates to therapeutic level.   Vanc 3/23>> Zosyn 3/23>>  3/26 VT = 13.6 on 1 gm IV q12h 3/31 VT = 14.1 (extrapolated to ~15.3) on 1250mg  q12h   Goal of Therapy:  Vancomycin  trough level 15-20 mcg/ml   Plan:  - Continue vanc 1250mg  IV Q12H - Continue Zosyn 3.375gm IV Q8H, 4 hr infusion - Monitor renal fxn, clinical progress, weekly vanc trough    Garima Chronis D. Mina Marble, PharmD, BCPS Pager:  [phone] 03/12/2015, 7:51 PM

## 2015-03-12 NOTE — Significant Event (Signed)
Patient reports increased difficulty breathing, MD notified and at bedside to see patient.  Patients wife called and discussed POC to intubate patient at this time.  Patient in agreeance with intubation after speaking to Dr. Elsworth Soho.  Will continue to monitor closely.

## 2015-03-12 NOTE — Progress Notes (Signed)
PT Cancellation Note  Patient Details Name: Russell [name] MRN: [id number] DOB: [birth year]   Cancelled Treatment:    Reason Eval/Treat Not Completed: Patient not medically ready Patient on bed rest orders. Will hold PT at this time and follow up when patient is medically ready for physical therapy.  Ellouise Newer 03/12/2015, 8:27 AM Elayne Snare, [place]

## 2015-03-12 NOTE — Progress Notes (Addendum)
      [address]       [place] [address]             [phone]            Subjective: Patient denies chest pain. Difficult for him to sleep with Bipap.  Objective: Vital signs in last 24 hours: Temp:  [97.6 F (36.4 C)-98.6 F (37 C)] 98.3 F (36.8 C) (03/31 0434) Pulse Rate:  [52-72] 62 (03/31 0529) Cardiac Rhythm:  [-] Normal sinus rhythm (03/31 0400) Resp:  [24-42] 28 (03/31 0529) BP: (119-180)/(42-124) 124/56 mmHg (03/31 0500) SpO2:  [76 %-97 %] 88 % (03/31 0500) FiO2 (%):  [70 %-100 %] 100 % (03/31 0300) Weight:  [179 lb 3.7 oz (81.3 kg)] 179 lb 3.7 oz (81.3 kg) (03/31 0456)     Intake/Output from previous day: 03/30 0701 - 03/31 0700 In: 1730 [P.O.:1080; IV Piggyback:650] Out: 1175 [Urine:1175]   Physical Exam:  Cardiovascular: RRR Pulmonary: Diminished at bases L>R  Abdomen: Soft, non tender, bowel sounds present. Extremities: No lower extremity edema. Wounds: Clean and dry.  No erythema or signs of infection.   Lab Results: CBC:  Recent Labs  03/10/15 0358 03/12/15 0525  WBC 14.3* 17.8*  HGB 10.1* 10.4*  HCT 31.3* 32.4*  PLT 570* 428*   BMET:   Recent Labs  03/11/15 0235 03/12/15 0525  NA 140 140  K 4.5 4.2  CL 107 105  CO2 27 25  GLUCOSE 184* 167*  BUN 31* 28*  CREATININE 0.69 1.01  CALCIUM 8.4 8.2*    PT/INR: No results for input(s): LABPROT, INR in the last 72 hours. ABG:  INR: Will add last result for INR, ABG once components are confirmed Will add last 4 CBG results once components are confirmed  Assessment/Plan:  1. CV - SR in the 70's this am. On Pradaxa 150 mg bid (had post op a fib), Lopressor 12.5 mg bid.  2.  Pulmonary - On Bi pap. CXR appears similar to previous exams. On Solumedrol 60 IV tid for pulmonary toxicity (related to Amiodarone, which was stopped). Per pulmonary/CCM 3. Anemia-H and H stable at 10.4 and 32.4  4.ID-On Vanco and Zosyn (day #7) for PNA 5. Volume overload (heart  failure)-On Lasix 40 mg IV daily.  6. WBC increased for 14.3 to 17.8 but has been on Solumedrol.   ZIMMERMAN,DONIELLE MPA-C 03/12/2015,6:46 AM  Patient seen and examined, agree with above Currently on BIPAP and comfortable He says he doesn't feel any different when on or off BIPAP  Nathalie Cavendish C. Roxan Hockey, MD [facility] [phone]

## 2015-03-12 NOTE — Procedures (Signed)
Central Venous Catheter Insertion Procedure Note KALE RONDEAU [id number] 07-Aug-1946  Procedure: Insertion of Central Venous Catheter Indications: Assessment of intravascular volume, Drug and/or fluid administration and Frequent blood sampling  Procedure Details Consent: Risks of procedure as well as the alternatives and risks of each were explained to the (patient/caregiver).  Consent for procedure obtained. Time Out: Verified patient identification, verified procedure, site/side was marked, verified correct patient position, special equipment/implants available, medications/allergies/relevent history reviewed, required imaging and test results available.  Performed  Maximum sterile technique was used including antiseptics, cap, gloves, gown, hand hygiene, mask and sheet. Skin prep: Chlorhexidine; local anesthetic administered A antimicrobial bonded/coated triple lumen catheter was placed in the left internal jugular vein using the Seldinger technique.  Evaluation Blood flow good Complications: No apparent complications Patient did tolerate procedure well. Chest X-ray ordered to verify placement.  CXR: pending.  Procedure performed under direct ultrasound guidance for real time vessel cannulation.      [name], [place] Pulmonary & Critical Care Medicine Pager: [phone]  or [phone] 03/12/2015, 7:52 PM

## 2015-03-12 NOTE — Progress Notes (Signed)
Name: Russell [name] MRN: [id number] DOB: Jan 25, 1946    ADMISSION DATE:  03/08/2015 CONSULTATION DATE:  3/28  REFERRING MD :  Roxan Hockey   CHIEF COMPLAINT:  Pulmonary infiltrates, hypoxia   BRIEF PATIENT DESCRIPTION: 69yo male with hx Afib, HTN, newly dx non-small cell lung ca, s/p recent LLLobectomy 3/15, pos LNs.  He was d/c post op and returned 3/23 to the office with progressive SOB.  In office his sats were in 70's on RA and he was tx to [facility].  He was admitted by CVTS with ?HCAP v pulm edema.  He was treated with IV abx and lasix but remained SOB and CT revealed diffuse ground glass opacities, amiodarone was stopped and PCCM consulted for further recs.   SIGNIFICANT EVENTS  CTA chest 3/27>>> NEG PE, Interval development of diffuse bilateral predominately ground-glass and associated consolidative pulmonary opacities which may be secondary to multi focal infection or potentially an inflammatory process such as drug reaction. Hemorrhage could have a similar appearance. 3/29 tx to ICU increased FIO2 needs   STUDIES:  2D echo 3/25>>>EF 50-55%, trivial pericardiac effusion, small L pleural effusion, trivial MR, mild Pulm regurg  SUBJECTIVE: Remains hypoxic , on NRB Large leak on bipap due to beard afebrile  VITAL SIGNS: Temp:  [97.2 F (36.2 C)-98.6 F (37 C)] 97.2 F (36.2 C) (03/31 0709) Pulse Rate:  [52-73] 73 (03/31 0900) Resp:  [24-42] 34 (03/31 0900) BP: (119-180)/(42-124) 156/65 mmHg (03/31 0900) SpO2:  [76 %-97 %] 85 % (03/31 0900) FiO2 (%):  [70 %-100 %] 90 % (03/31 0732) Weight:  [81.3 kg (179 lb 3.7 oz)] 81.3 kg (179 lb 3.7 oz) (03/31 0456)  PHYSICAL EXAMINATION: General:  Pleasant, chronically ill appearing male, NAD in bed ,on 70% NRB (flaps open) Neuro:  Awake, alert, appropriate, MAE  HEENT:  Mm dry, no JVD  Cardiovascular:  s1s2 irreg  Lungs:  resps even non labored at rest, diminished bases otherwise fairly clear, L chest dressing c/d . Able to  speak in sentences Abdomen:  Soft, nt, nd, +bs  Musculoskeletal:  Warm and dry, no edema   Recent Labs Lab 03/08/15 1156 03/11/15 0235 03/12/15 0525  NA 140 140 140  K 3.9 4.5 4.2  CL 103 107 105  CO2 27 27 25   BUN 14 31* 28*  CREATININE 1.06 0.69 1.01  GLUCOSE 127* 184* 167*    Recent Labs Lab 03/08/15 1156 03/10/15 0358 03/12/15 0525  HGB 10.8* 10.1* 10.4*  HCT 33.2* 31.3* 32.4*  WBC 21.2* 14.3* 17.8*  PLT 594* 570* 428*   Dg Chest Port 1 View  03/12/2015   CLINICAL DATA:  Acute respiratory failure with hypoxia  EXAM: PORTABLE CHEST - 1 VIEW  COMPARISON:  Portable chest x-ray of March 11, 2015  FINDINGS: The lungs are reasonably well inflated. There is persistent dense consolidation of the mid and lower left lung. The left hemidiaphragm remains obscured. Confluent alveolar density in the right mid and lower lung is present and stable. The upper lobes are reasonably well expanded and last involved with parenchymal infiltrate. The cardiac silhouette is largely obscured. The pulmonary vascularity is not clearly engorged. The bony thorax exhibits no acute abnormality.  IMPRESSION: Widespread airspace opacities in the mid and lower lung zones little changed from yesterday's study consistent within up pneumonia, alveolar edema, or other alveolar filling process. A small to moderate size left pleural effusion persists.   Electronically Signed   By: David  Martinique   On: 03/12/2015 07:33  Dg Chest Port 1 View  03/11/2015   CLINICAL DATA:  Hypoxia and pleural effusion  EXAM: PORTABLE CHEST - 1 VIEW  COMPARISON:  Chest x-ray from yesterday  FINDINGS: Bilateral airspace disease is unchanged. Sparing of the apices, likely from emphysema. There is a small to moderate layering left pleural effusion. The lower heart is obscured but there is no appreciable change in size. Stable aortic and hilar contours, with surgical clips in the left hilum. No evidence of pneumothorax.  IMPRESSION: Stable edema  or multifocal pneumonia with small to moderate left effusion.   Electronically Signed   By: Monte Fantasia M.D.   On: 03/11/2015 07:27    ASSESSMENT / PLAN:  Acute hypoxic respiratory failure - multifactorial in setting recent LLLobectomy for NSCLCA, mild volume overload which is likely now resolved after diuresis, ?HCAP now with concern for amiodarone toxicity given CT appearance. Amiodarone stopped 3/27.    REC -  Cont IV abx for HCAP Aggressive pulm hygiene. Cont hold amiodarone  Likely at dry weight at this point, continue with PO lasix but no further IV needed HR control as ordered, cards signing off ? Amiodarone toxicity on 3/28, continue solumedrol at 60 mg IV q8 for now. Supplemental O2 as needed  Check CXR in AM. BiPAP prn for WOB Can try high flow cannula while eating  Updated wife at bedside  The patient is critically ill with multiple organ systems failure and requires high complexity decision making for assessment and support, frequent evaluation and titration of therapies, application of advanced monitoring technologies and extensive interpretation of multiple databases. Critical Care Time devoted to patient care services described in this note independent of APP time is 35 minutes.    Rigoberto Noel MD  03/12/2015, 9:38 AM

## 2015-03-12 NOTE — Procedures (Signed)
Intubation Procedure Note Russell [name] [id number] 1946-10-19  Procedure: Intubation Indications: Respiratory insufficiency  Procedure Details Consent: Risks of procedure as well as the alternatives and risks of each were explained to the (patient/caregiver).  Consent for procedure obtained. Time Out: Verified patient identification, verified procedure, site/side was marked, verified correct patient position, special equipment/implants available, medications/allergies/relevent history reviewed, required imaging and test results available.  Performed  Drugs 229mcg Fentanyl, 4mg  Versed, 20mg  Etomidate, 50mg  Rocuronium. Indirect laryngoscopy using glidescope with # 4 blade. 7.5 tube passed through cords under direct visualization. Grade 1 view.   Evaluation Hemodynamic Status: BP stable throughout; O2 sats: stable throughout Patient's Current Condition: stable Complications: No apparent complications Patient did tolerate procedure well. Chest X-ray ordered to verify placement.  CXR: pending.   Russell [name], [name] Pulmonary & Critical Care Medicine Pager: [phone]  or [phone] 03/12/2015, 7:01 PM

## 2015-03-13 ENCOUNTER — Inpatient Hospital Stay (HOSPITAL_COMMUNITY): Payer: Medicare Other

## 2015-03-13 DIAGNOSIS — I313 Pericardial effusion (noninflammatory): Secondary | ICD-10-CM

## 2015-03-13 DIAGNOSIS — A419 Sepsis, unspecified organism: Secondary | ICD-10-CM | POA: Insufficient documentation

## 2015-03-13 DIAGNOSIS — J80 Acute respiratory distress syndrome: Secondary | ICD-10-CM | POA: Insufficient documentation

## 2015-03-13 DIAGNOSIS — R6521 Severe sepsis with septic shock: Secondary | ICD-10-CM

## 2015-03-13 DIAGNOSIS — J939 Pneumothorax, unspecified: Secondary | ICD-10-CM

## 2015-03-13 LAB — BLOOD GAS, ARTERIAL
Acid-base deficit: 2.3 mmol/L — ABNORMAL HIGH (ref 0.0–2.0)
Bicarbonate: 24.9 mEq/L — ABNORMAL HIGH (ref 20.0–24.0)
Drawn by: 39898
FIO2: 100 %
MECHVT: 460 mL
O2 Saturation: 92.1 %
PATIENT TEMPERATURE: 98.6
PEEP/CPAP: 15 cmH2O
PH ART: 7.193 — AB (ref 7.350–7.450)
RATE: 28 resp/min
TCO2: 26.9 mmol/L (ref 0–100)
pCO2 arterial: 67.3 mmHg (ref 35.0–45.0)
pO2, Arterial: 85.5 mmHg (ref 80.0–100.0)

## 2015-03-13 LAB — BASIC METABOLIC PANEL
ANION GAP: 13 (ref 5–15)
Anion gap: 8 (ref 5–15)
BUN: 44 mg/dL — AB (ref 6–23)
BUN: 55 mg/dL — ABNORMAL HIGH (ref 6–23)
CHLORIDE: 105 mmol/L (ref 96–112)
CO2: 26 mmol/L (ref 19–32)
CO2: 28 mmol/L (ref 19–32)
Calcium: 7.3 mg/dL — ABNORMAL LOW (ref 8.4–10.5)
Calcium: 7.3 mg/dL — ABNORMAL LOW (ref 8.4–10.5)
Chloride: 109 mmol/L (ref 96–112)
Creatinine, Ser: 2.12 mg/dL — ABNORMAL HIGH (ref 0.50–1.35)
Creatinine, Ser: 2.91 mg/dL — ABNORMAL HIGH (ref 0.50–1.35)
GFR calc Af Amer: 35 mL/min — ABNORMAL LOW (ref >90)
GFR calc Af Amer: 24 mL/min — ABNORMAL LOW (ref >90)
GFR, EST NON AFRICAN AMERICAN: 30 mL/min — AB (ref >90)
GFR, EST NON AFRICAN AMERICAN: 20 mL/min — AB (ref >90)
Glucose, Bld: 234 mg/dL — ABNORMAL HIGH (ref 70–99)
Glucose, Bld: 286 mg/dL — ABNORMAL HIGH (ref 70–99)
POTASSIUM: 5.9 mmol/L — AB (ref 3.5–5.1)
POTASSIUM: 5.2 mmol/L — AB (ref 3.5–5.1)
SODIUM: 143 mmol/L (ref 135–145)
Sodium: 146 mmol/L — ABNORMAL HIGH (ref 135–145)

## 2015-03-13 LAB — RENAL FUNCTION PANEL
ALBUMIN: 1.9 g/dL — AB (ref 3.5–5.2)
Anion gap: 16 — ABNORMAL HIGH (ref 5–15)
BUN: 54 mg/dL — AB (ref 6–23)
CALCIUM: 7.2 mg/dL — AB (ref 8.4–10.5)
CHLORIDE: 103 mmol/L (ref 96–112)
CO2: 26 mmol/L (ref 19–32)
CREATININE: 3.04 mg/dL — AB (ref 0.50–1.35)
GFR calc Af Amer: 22 mL/min — ABNORMAL LOW (ref >90)
GFR, EST NON AFRICAN AMERICAN: 19 mL/min — AB (ref >90)
Glucose, Bld: 280 mg/dL — ABNORMAL HIGH (ref 70–99)
PHOSPHORUS: 7.1 mg/dL — AB (ref 2.3–4.6)
Potassium: 4.9 mmol/L (ref 3.5–5.1)
Sodium: 145 mmol/L (ref 135–145)

## 2015-03-13 LAB — CBC
HEMATOCRIT: 34.5 % — AB (ref 39.0–52.0)
HEMOGLOBIN: 10.6 g/dL — AB (ref 13.0–17.0)
MCH: 28.3 pg (ref 26.0–34.0)
MCHC: 30.7 g/dL (ref 30.0–36.0)
MCV: 92.2 fL (ref 78.0–100.0)
Platelets: 488 10*3/uL — ABNORMAL HIGH (ref 150–400)
RBC: 3.74 MIL/uL — AB (ref 4.22–5.81)
RDW: 14 % (ref 11.5–15.5)
WBC: 23.8 10*3/uL — AB (ref 4.0–10.5)

## 2015-03-13 LAB — POCT I-STAT 3, ART BLOOD GAS (G3+)
ACID-BASE DEFICIT: 8 mmol/L — AB (ref 0.0–2.0)
ACID-BASE DEFICIT: 1 mmol/L (ref 0.0–2.0)
Acid-base deficit: 3 mmol/L — ABNORMAL HIGH (ref 0.0–2.0)
BICARBONATE: 27.4 meq/L — AB (ref 20.0–24.0)
Bicarbonate: 19.5 mEq/L — ABNORMAL LOW (ref 20.0–24.0)
Bicarbonate: 25 mEq/L — ABNORMAL HIGH (ref 20.0–24.0)
O2 SAT: 97 %
O2 Saturation: 89 %
O2 Saturation: 91 %
PCO2 ART: 49.8 mmHg — AB (ref 35.0–45.0)
PH ART: 7.169 — AB (ref 7.350–7.450)
PO2 ART: 79 mmHg — AB (ref 80.0–100.0)
Patient temperature: 100.3
TCO2: 21 mmol/L (ref 0–100)
TCO2: 26 mmol/L (ref 0–100)
TCO2: 30 mmol/L (ref 0–100)
pCO2 arterial: 49.4 mmHg — ABNORMAL HIGH (ref 35.0–45.0)
pCO2 arterial: 75.4 mmHg (ref 35.0–45.0)
pH, Arterial: 7.204 — ABNORMAL LOW (ref 7.350–7.450)
pH, Arterial: 7.312 — ABNORMAL LOW (ref 7.350–7.450)
pO2, Arterial: 69 mmHg — ABNORMAL LOW (ref 80.0–100.0)
pO2, Arterial: 99 mmHg (ref 80.0–100.0)

## 2015-03-13 LAB — GLUCOSE, CAPILLARY
GLUCOSE-CAPILLARY: 243 mg/dL — AB (ref 70–99)
GLUCOSE-CAPILLARY: 211 mg/dL — AB (ref 70–99)
Glucose-Capillary: 280 mg/dL — ABNORMAL HIGH (ref 70–99)
Glucose-Capillary: 270 mg/dL — ABNORMAL HIGH (ref 70–99)
Glucose-Capillary: 235 mg/dL — ABNORMAL HIGH (ref 70–99)

## 2015-03-13 LAB — TROPONIN I: Troponin I: 1.97 ng/mL (ref <0.031)

## 2015-03-13 LAB — PROCALCITONIN: PROCALCITONIN: 1.76 ng/mL

## 2015-03-13 MED ORDER — DEXTROSE 50 % IV SOLN
1.0000 | Freq: Once | INTRAVENOUS | Status: AC
  Administered 2015-03-13: 50 mL via INTRAVENOUS
  Filled 2015-03-13: qty 50

## 2015-03-13 MED ORDER — SODIUM CHLORIDE 0.9 % FOR CRRT
INTRAVENOUS_CENTRAL | Status: DC | PRN
  Filled 2015-03-13: qty 1000

## 2015-03-13 MED ORDER — HEPARIN SODIUM (PORCINE) 1000 UNIT/ML DIALYSIS
1000.0000 [IU] | INTRAMUSCULAR | Status: DC | PRN
  Administered 2015-03-13: 2400 [IU] via INTRAVENOUS_CENTRAL
  Filled 2015-03-13 (×2): qty 6

## 2015-03-13 MED ORDER — HEPARIN SODIUM (PORCINE) 5000 UNIT/ML IJ SOLN
5000.0000 [IU] | Freq: Three times a day (TID) | INTRAMUSCULAR | Status: DC
  Administered 2015-03-13 – 2015-03-14 (×4): 5000 [IU] via SUBCUTANEOUS
  Filled 2015-03-13 (×5): qty 1

## 2015-03-13 MED ORDER — SODIUM BICARBONATE 8.4 % IV SOLN
INTRAVENOUS | Status: AC
Start: 2015-03-13 — End: 2015-03-13
  Filled 2015-03-13: qty 100

## 2015-03-13 MED ORDER — INSULIN ASPART 100 UNIT/ML ~~LOC~~ SOLN
0.0000 [IU] | SUBCUTANEOUS | Status: DC
  Administered 2015-03-13: 11 [IU] via SUBCUTANEOUS
  Administered 2015-03-13 (×2): 7 [IU] via SUBCUTANEOUS
  Administered 2015-03-14 – 2015-03-15 (×8): 4 [IU] via SUBCUTANEOUS
  Administered 2015-03-15: 7 [IU] via SUBCUTANEOUS
  Administered 2015-03-15: 3 [IU] via SUBCUTANEOUS
  Administered 2015-03-15 (×2): 7 [IU] via SUBCUTANEOUS
  Administered 2015-03-16: 4 [IU] via SUBCUTANEOUS
  Administered 2015-03-16: 11 [IU] via SUBCUTANEOUS
  Administered 2015-03-16: 4 [IU] via SUBCUTANEOUS
  Administered 2015-03-16: 11 [IU] via SUBCUTANEOUS
  Administered 2015-03-16 (×2): 7 [IU] via SUBCUTANEOUS
  Administered 2015-03-17: 3 [IU] via SUBCUTANEOUS
  Administered 2015-03-17: 7 [IU] via SUBCUTANEOUS
  Administered 2015-03-17 – 2015-03-18 (×4): 4 [IU] via SUBCUTANEOUS
  Administered 2015-03-18: 3 [IU] via SUBCUTANEOUS
  Administered 2015-03-18: 4 [IU] via SUBCUTANEOUS

## 2015-03-13 MED ORDER — SODIUM BICARBONATE 8.4 % IV SOLN
100.0000 meq | Freq: Once | INTRAVENOUS | Status: AC
  Administered 2015-03-13: 100 meq via INTRAVENOUS

## 2015-03-13 MED ORDER — SODIUM CHLORIDE 0.9 % IV BOLUS (SEPSIS)
1000.0000 mL | Freq: Once | INTRAVENOUS | Status: AC
  Administered 2015-03-13: 1000 mL via INTRAVENOUS

## 2015-03-13 MED ORDER — MEROPENEM 500 MG IV SOLR
500.0000 mg | Freq: Every day | INTRAVENOUS | Status: DC
  Administered 2015-03-13: 500 mg via INTRAVENOUS
  Filled 2015-03-13: qty 0.5

## 2015-03-13 MED ORDER — SODIUM BICARBONATE 8.4 % IV SOLN
INTRAVENOUS | Status: AC
  Administered 2015-03-13: 100 meq via INTRAVENOUS
  Filled 2015-03-13: qty 100

## 2015-03-13 MED ORDER — SODIUM POLYSTYRENE SULFONATE 15 GM/60ML PO SUSP
30.0000 g | Freq: Once | ORAL | Status: AC
  Administered 2015-03-13: 30 g
  Filled 2015-03-13: qty 120

## 2015-03-13 MED ORDER — PRISMASOL BGK 4/2.5 32-4-2.5 MEQ/L IV SOLN
INTRAVENOUS | Status: DC
  Administered 2015-03-13 – 2015-03-17 (×9): via INTRAVENOUS_CENTRAL
  Filled 2015-03-13 (×11): qty 5000

## 2015-03-13 MED ORDER — SODIUM BICARBONATE 8.4 % IV SOLN
INTRAVENOUS | Status: DC
  Administered 2015-03-13 – 2015-03-14 (×4): via INTRAVENOUS
  Filled 2015-03-13 (×7): qty 150

## 2015-03-13 MED ORDER — METHYLPREDNISOLONE SODIUM SUCC 125 MG IJ SOLR
125.0000 mg | Freq: Three times a day (TID) | INTRAMUSCULAR | Status: DC
  Administered 2015-03-13 – 2015-03-15 (×6): 125 mg via INTRAVENOUS
  Filled 2015-03-13 (×6): qty 2

## 2015-03-13 MED ORDER — VANCOMYCIN HCL IN DEXTROSE 750-5 MG/150ML-% IV SOLN
750.0000 mg | INTRAVENOUS | Status: DC
  Administered 2015-03-14 – 2015-03-17 (×4): 750 mg via INTRAVENOUS
  Filled 2015-03-13 (×5): qty 150

## 2015-03-13 MED ORDER — PRISMASOL BGK 4/2.5 32-4-2.5 MEQ/L IV SOLN
INTRAVENOUS | Status: DC
  Administered 2015-03-13 – 2015-03-18 (×19): via INTRAVENOUS_CENTRAL
  Filled 2015-03-13 (×30): qty 5000

## 2015-03-13 MED ORDER — DOCUSATE SODIUM 50 MG/5ML PO LIQD
100.0000 mg | Freq: Two times a day (BID) | ORAL | Status: DC
  Administered 2015-03-13 – 2015-03-17 (×10): 100 mg via ORAL
  Filled 2015-03-13 (×12): qty 10

## 2015-03-13 MED ORDER — ASPIRIN 81 MG PO CHEW
81.0000 mg | CHEWABLE_TABLET | Freq: Every day | ORAL | Status: DC
  Administered 2015-03-13 – 2015-03-17 (×5): 81 mg
  Filled 2015-03-13 (×5): qty 1

## 2015-03-13 MED ORDER — INSULIN ASPART 100 UNIT/ML IV SOLN
10.0000 [IU] | Freq: Once | INTRAVENOUS | Status: AC
  Administered 2015-03-13: 10 [IU] via INTRAVENOUS

## 2015-03-13 MED ORDER — SODIUM CHLORIDE 0.9 % IV SOLN: 1250.0000 mg | INTRAVENOUS | Status: DC

## 2015-03-13 MED ORDER — VASOPRESSIN 20 UNIT/ML IV SOLN
0.0300 [IU]/min | INTRAVENOUS | Status: DC
  Administered 2015-03-13 – 2015-03-17 (×6): 0.03 [IU]/min via INTRAVENOUS
  Filled 2015-03-13 (×7): qty 2

## 2015-03-13 MED ORDER — SODIUM CHLORIDE 0.9 % IV SOLN
1.0000 g | Freq: Two times a day (BID) | INTRAVENOUS | Status: DC
  Administered 2015-03-14 – 2015-03-18 (×9): 1 g via INTRAVENOUS
  Filled 2015-03-13 (×11): qty 1

## 2015-03-13 MED ORDER — PRISMASOL BGK 4/2.5 32-4-2.5 MEQ/L IV SOLN
INTRAVENOUS | Status: DC
  Administered 2015-03-13 – 2015-03-17 (×6): via INTRAVENOUS_CENTRAL
  Filled 2015-03-13 (×6): qty 5000

## 2015-03-13 MED ORDER — SODIUM CHLORIDE 0.9 % IV SOLN
1.0000 g | Freq: Once | INTRAVENOUS | Status: AC
  Administered 2015-03-13: 1 g via INTRAVENOUS
  Filled 2015-03-13: qty 10

## 2015-03-13 NOTE — Progress Notes (Signed)
      [address]       [place] [address]             [phone]      Condition worsened overnight Had to be intubated and required high PEEP CXR this AM shows bilateral pneumothoraces and subcutaneous air on the right side I suspect he has a right pneumo and the left is a postop space, but best option is to place bilateral chest tubes Discussed with his S.O.- informed her of the risks and benefits She gives consent  Unclear what the underlying problem is- he has been on broad spectrum antibiotics for a week and moderate dose steroids almost as long. D/w Dr. Elsworth Soho- he will adjust medications.

## 2015-03-13 NOTE — Procedures (Signed)
Arterial Catheter Insertion Procedure Note Russell [name] [id number] 01-Aug-1946  Procedure: Insertion of Arterial Catheter  Indications: Blood pressure monitoring  Procedure Details Consent: Unable to obtain consent because of emergent medical necessity. Time Out: Verified patient identification, verified procedure, site/side was marked, verified correct patient position, special equipment/implants available, medications/allergies/relevent history reviewed, required imaging and test results available.  Performed  Maximum sterile technique was used including antiseptics, cap, gloves, gown, hand hygiene, mask and sheet. Skin prep: Chlorhexidine; local anesthetic administered 20 gauge catheter was inserted into left radial artery using the Seldinger technique.  Evaluation Blood flow good; BP tracing good. Complications: No apparent complications.   Maud Deed M 03/13/2015

## 2015-03-13 NOTE — Progress Notes (Signed)
[facility] Progress Note Patient Name: Russell [name] DOB: August 12, 1946 MRN: [id number]   Date of Service  03/13/2015  HPI/Events of Note  ABG on 100%/PRVC 24/TV 460/P 15 = 7.16/75/79/27.4  eICU Interventions  Will increase rate to 28 and recheck ABG at 1:30 AM. Will tolerate pH in the mid 7.2 range as peak and plateau pressures are already high.      Intervention Category Major Interventions: Respiratory failure - evaluation and management  Sommer,Steven Eugene 03/13/2015, 12:18 AM

## 2015-03-13 NOTE — Progress Notes (Signed)
Increase in K+ and creatinine level on morning labs.  MD notified.

## 2015-03-13 NOTE — Progress Notes (Addendum)
ANTIBIOTIC CONSULT NOTE - FOLLOW UP  Pharmacy Consult:  Meropenem Indication:  PNA  Allergies  Allergen Reactions  . Amiodarone Shortness Of Breath    Patient Measurements: Height: 5\' 6"  (167.6 cm) Weight: 186 lb 1.1 oz (84.4 kg) IBW/kg (Calculated) : 63.8  Vital Signs: Temp: 99.4 F (37.4 C) (04/01 1000) Temp Source: Core (Comment) (04/01 0700) BP: 149/54 mmHg (04/01 1000) Pulse Rate: 82 (04/01 1000) Intake/Output from previous day: 03/31 0701 - 04/01 0700 In: 2949.6 [P.O.:340; I.V.:1099.6; NG/GT:30; IV Piggyback:1350] Out: 1115 [Urine:1115]  Labs:  Recent Labs  03/11/15 0235 03/12/15 0525 03/13/15 0428  WBC  --  17.8* 23.8*  HGB  --  10.4* 10.6*  PLT  --  428* 488*  CREATININE 0.69 1.01 2.12*   Estimated Creatinine Clearance: 33 mL/min (by C-G formula based on Cr of 2.12).  Recent Labs  03/12/15 1844  [place] 14.1     Microbiology: Recent Results (from the past 720 hour(s))  Surgical pcr screen     Status: None   Collection Time: 02/20/15  2:41 PM  Result Value Ref Range Status   MRSA, PCR NEGATIVE NEGATIVE Final   Staphylococcus aureus NEGATIVE NEGATIVE Final    Comment:        The Xpert SA Assay (FDA approved for NASAL specimens in patients over 48 years of age), is one component of a comprehensive surveillance program.  Test performance has been validated by [facility] for patients greater than or equal to 23 year old. It is not intended to diagnose infection nor to guide or monitor treatment.      Assessment: 72 YOM with PMH of afib, HTN, newly dx NSCLC, s/p recent LLLobectomy 3/15.  Patient was discharged post op but returned with progressive SOB with sats in 18s.  Patient was transferred to [facility] and initiated with initial empiric IV abx.  CXR revealed diffuse bilateral ground-glass with consolidative pulmonary opacities 2/2 to multifocal infection.  Pharmacy was consulted to dose Vancomycin and Zosyn.  Patient received 9 days of  zosyn without significant improvement.  Patient to be transitioned to meropenem.  Since scr continued to rise significantly, will dose meropenem daily until renal function returns.  Vancomycin trough was drawn ~1.5 hours late on 3/31 yet it was only slightly sub-therapeutic at 14.1.  It extrapolates to therapeutic level.  WBC remains elevated 17.8 > 23.8 (note on IV methylpred).  Temp 99.8, Renal function has significantly reduced likely AKI.  Scr 0.69 > 1.01 > 2.12.  UO 0.6 mL/kg/hr, remains stable.   Procalcitonin pending.    Vanc 3/23 >>  Zosyn 3/23 >>4/1 Merrem 4/1 >>   3/26 VT = 13.6 on 1 gm IV q12h 3/31 VT = 14.1 (extrapolated to ~15.3) on 1250mg  q12h  Goal of Therapy:  Vancomycin trough level 15-20 mcg/ml  Plan:  - Change Vanc 1250mg  IV Q24H - Discontinue Zosyn 3.375gm IV Q8H, 4 hr infusion  - Initiate Meropenem 500 mg IV Q24H - Monitor renal fxn, clinical progress, vanc trough when appropriate  Hassie Bruce, Pharm. D. Clinical Pharmacy Resident Pager: [phone] Ph: [phone] 03/13/2015 11:06 AM   Addendum: CRRT initiated 1600 PM on 4/1.  Plan: Adjust vancomycin to 10mg /kg (750mg ) IV q24 hours - next dose due 4/2 after ~20 hours of CRRT. Adjust Merrem to 1g IV q12h - next dose after ~12 hours of CRRT Follow-up CRRT toleration.   Sloan Leiter, PharmD, BCPS Clinical Pharmacist [phone] 03/13/2015, 4:18 PM

## 2015-03-13 NOTE — Progress Notes (Addendum)
Noted that last blood sugar was 280 mg/dl. Recommend checking HgbA1C for home blood glucose control and start Novolog SENSITIVE correction scale  if CBGs continue to be greater than 180 mg/d.  Recommend checking CBGs every 4 hours while NPO and then TID & HS when eating while in the hospital. Harvel Ricks RN BSN CDE

## 2015-03-13 NOTE — Progress Notes (Signed)
PT Cancellation Note  Patient Details Name: Russell [name] MRN: [id number] DOB: 1946/03/18   Cancelled Treatment:    Reason Eval/Treat Not Completed: Patient not medically ready Patient with decline in medical status since previous therapy visit. Remains on bed rest orders. Will d/c from therapy. Please re-order when you feel patient is medically ready to continue therapy services.  Ellouise Newer 03/13/2015, 8:17 AM Elayne Snare, [place]

## 2015-03-13 NOTE — Progress Notes (Signed)
INITIAL NUTRITION ASSESSMENT  DOCUMENTATION CODES Per approved criteria  -Obesity Unspecified   INTERVENTION: Recommend starting TF within 24 hours: Initiate TF via OGT with Vital High Protein at 25 ml/h and Prostat 30 ml TID on day 1; on day 2, increase to goal rate of 40 ml/h (960 ml per day) and continue Pro-stat TID to provide 1260 kcals, 129 gm protein, 806 ml free water daily.   NUTRITION DIAGNOSIS: Inadequate oral intake related to inability to eat as evidenced by NPO/Vent status.   Goal: Enteral nutrition to provide 65-70% of estimated calorie needs based on ASPEN guidelines for hypocaloric, high protein feeding in critically ill obese individuals   Monitor:  Vent status, TF initiation, weight trend, labs  Reason for Assessment: Vent  69 y.o. male  Admitting Dx: <principal problem not specified>  ASSESSMENT: 69yo male with hx Afib, HTN, newly dx non-small cell lung ca, s/p recent LLLobectomy 3/15, pos LNs. He was d/c post op and returned 3/23 to the office with progressive SOB. Pt was intubated last night (3/31). OGT in place  Patient is currently intubated on ventilator support MV: 15 L/min Temp (24hrs), Avg:99.1 F (37.3 C), Min:97.2 F (36.2 C), Max:100 F (37.8 C)  Propofol: none  Per pt's wife at bedside pt was eating well PTA usually weighs between 190 and 195 lbs but, appears to have lost some weight since being hospitalized. She states that pt has been making an effort to eat but, has had some difficulty eating due to breathing difficulty and painful swallowing; pt has been drinking liquids well. Per nursing notes meal completion has ranged 40% to 100% this past week. Wife also reports that patient is prediabetic; he has been eating whole grains and no sugar/sweets for the past year.  Labs: elevated potassium, elevated glucose, low calcium, low hemoglobin, low GFR  Nutrition Focused Physical Exam:  Subcutaneous Fat:  Orbital Region: wnl Upper Arm  Region: wnl Thoracic and Lumbar Region: NA  Muscle:  Temple Region: mild wasting Clavicle Bone Region: mild wasting Clavicle and Acromion Bone Region: mild wasting Scapular Bone Region: NA Dorsal Hand: NA Patellar Region: mild wasting Anterior Thigh Region: mild wasting Posterior Calf Region: mild wasting  Edema: none noted  Height: Ht Readings from Last 1 Encounters:  03/11/2015 5\' 6"  (1.676 m)    Weight: Wt Readings from Last 1 Encounters:  03/13/15 186 lb 1.1 oz (84.4 kg)    Ideal Body Weight: 142 lbs (64.5 kg)  % Ideal Body Weight: 131%  Wt Readings from Last 10 Encounters:  03/13/15 186 lb 1.1 oz (84.4 kg)  02/13/2015 192 lb 8 oz (87.317 kg)  03/01/15 190 lb 14.7 oz (86.6 kg)  02/20/15 193 lb 5.5 oz (87.7 kg)  02/19/15 188 lb (85.276 kg)  02/11/15 190 lb (86.183 kg)  01/26/15 193 lb (87.544 kg)  06/19/12 197 lb 3.2 oz (89.449 kg)    Usual Body Weight: 190 lbs  % Usual Body Weight: 98%  BMI:  Body mass index is 30.05 kg/(m^2). (obese)  Estimated Nutritional Needs: Kcal: 510-2585 Protein: >/=129 grams Fluid: 2.4 L/day  Skin: closed incision on left chest  Diet Order: Diet NPO time specified  EDUCATION NEEDS: -No education needs identified at this time   Intake/Output Summary (Last 24 hours) at 03/13/15 1137 Last data filed at 03/13/15 1100  Gross per 24 hour  Intake 3672.53 ml  Output   1455 ml  Net 2217.53 ml    Last BM: 3/31  Labs:   Recent Labs  Lab 03/11/15 0235 03/12/15 0525 03/13/15 0428  NA 140 140 143  K 4.5 4.2 5.9*  CL 107 105 109  CO2 27 25 26   BUN 31* 28* 44*  CREATININE 0.69 1.01 2.12*  CALCIUM 8.4 8.2* 7.3*  MG  --  2.4  --   PHOS  --  3.2  --   GLUCOSE 184* 167* 234*    CBG (last 3)   Recent Labs  03/12/15 0036 03/13/15 0900  GLUCAP 187* 280*    Scheduled Meds: . antiseptic oral rinse  7 mL Mouth Rinse q12n4p  . antiseptic oral rinse  7 mL Mouth Rinse QID  . artificial tears  1 application Both Eyes 3  times per day  . chlorhexidine  15 mL Mouth Rinse BID  . chlorhexidine  15 mL Mouth Rinse BID  . docusate  100 mg Oral BID  . heparin subcutaneous  5,000 Units Subcutaneous 3 times per day  . meropenem (MERREM) IV  500 mg Intravenous Daily  . methylPREDNISolone (SOLU-MEDROL) injection  125 mg Intravenous 3 times per day  . pantoprazole (PROTONIX) IV  40 mg Intravenous Daily  . sodium chloride  3 mL Intravenous Q12H  . [START ON 03/14/2015] vancomycin  1,250 mg Intravenous Q24H    Continuous Infusions: . cisatracurium (NIMBEX) infusion 7.5 mcg/kg/min (03/13/15 0730)  . fentaNYL infusion INTRAVENOUS 150 mcg/hr (03/13/15 1057)  . midazolam (VERSED) infusion 1.5 mg/hr (03/13/15 0714)  . norepinephrine (LEVOPHED) Adult infusion 34 mcg/min (03/13/15 1058)  .  sodium bicarbonate  infusion 1000 mL 75 mL/hr at 03/13/15 0410  . vasopressin (PITRESSIN) infusion - *FOR SHOCK* 0.03 Units/min (03/13/15 0253)    Past Medical History  Diagnosis Date  . Hyperlipidemia   . Depression   . Hypertension   . AAA (abdominal aortic aneurysm)     a. 01/23/15 CT: Infrarenal AAA  - 4.4cm. Monitoted by dr early  . Left ureteral calculus   . Bladder stone   . Prostate cancer     a. T2a N0 M0 Gleason 8 prostate cancer, biopsy w/ 10/12 cores positve including 2 w/ Gleason  & remainder both 7 (4+3) & 6;  b. PSA 5.5;  c. CT and PET w/o evidence for prostate Ca mets.  . Frequency of urination   . Nocturia   . BPH (benign prostatic hypertrophy)   . History of kidney stones   . Arthritis   . Wears glasses   . At risk for sleep apnea     STOP-BANG= 5 SENT TO PCP 02-17-2015  . Cancer of left lung     a. 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 Post-op course complicated by bleeding (hemothorax req chest tube) and afib.  . Atrial fibrillation     a. 02/2015 noted post-op lobectomy;  b. CHA2DS2VASc = 3-->placed on pradaxa (chosen b/c of reversal available and potential for repeat surgeries).  . Multifocal  atrial tachycardia     Past Surgical History  Procedure Laterality Date  . Cholecystectomy open  1992  . Cystoscopy/retrograde/ureteroscopy/stone extraction with basket Left 02/19/2015    Procedure: CYSTO WITH BLADDER STONE REMOVAL/LEFT URETEROSCOPY STONE EXTRACTION ;  Surgeon: Irine Seal, MD;  Location: [facility];  Service: Urology;  Laterality: Left;  . Prostate biopsy N/A 02/19/2015    Procedure: BIOPSY TRANSRECTAL ULTRASONIC PROSTATE (TUBP);  Surgeon: Irine Seal, MD;  Location: [facility];  Service: Urology;  Laterality: N/A;  . Video assisted thoracoscopy (vats)/wedge resection Left 02/23/2015    Procedure: LEFT  VIDEO ASSISTED THORACOSCOPY ;  Surgeon: Melrose Nakayama, MD;  Location: [place];  Service: Thoracic;  Laterality: Left;  . Lobectomy Left 02/23/2015    Procedure:  LEFT LOWER LOBECTOMY WITH NODE DISSECTION;  Surgeon: Melrose Nakayama, MD;  Location: [place];  Service: Thoracic;  Laterality: Left;  . Lead removal Left 02/23/2015    Procedure: CRYO INTERCOSTAL NERVE BLOCK;  Surgeon: Melrose Nakayama, MD;  Location: [place];  Service: Thoracic;  Laterality: Left;  . Video assisted thoracoscopy (vats)/ lobectomy Left 02/23/2015    Procedure: Video assisted thorocoscopy, exploration left chest for post operative bleeding.;  Surgeon: Melrose Nakayama, MD;  Location: [place];  Service: Thoracic;  Laterality: Left;    Pryor Ochoa RD, LDN Inpatient Clinical Dietitian Pager: [phone] After Hours Pager: [phone]

## 2015-03-13 NOTE — Progress Notes (Signed)
Respiratory status change.  Chest Xray obtained, per order, and MD notified.  Will continue to monitor.

## 2015-03-13 NOTE — Procedures (Signed)
Bilateral pneumothoraces on CXR Patient intubated and sedated Bilateral 75F chest tubes placed. + air leak on right

## 2015-03-13 NOTE — Progress Notes (Signed)
[facility] Progress Note Patient Name: Russell [name] DOB: Jan 15, 1946 MRN: [id number]   Date of Service  03/13/2015  HPI/Events of Note  K+ = 5.9 and Creatinine = 2.2. Already on a sodium bicarbonate IV infusion.   eICU Interventions  Will order: 1. Kayexalate 30 gm via g tube. 2. D50 and Insulin. 3. Calcium gluconate 1 gm IV. 4. Repeat K+ at 12 noon.     Intervention Category Major Interventions: Electrolyte abnormality - evaluation and management  Russell [name] 03/13/2015, 6:38 AM

## 2015-03-13 NOTE — Progress Notes (Signed)
CRRT started right before 1600.  10cc of blood were aspirated from each port prior to starting CRRT. Will monitor

## 2015-03-13 NOTE — Progress Notes (Addendum)
Name: Russell [name] MRN: [id number] DOB: 30-Nov-1946    ADMISSION DATE:  02/26/2015 CONSULTATION DATE:  3/28  REFERRING MD :  Roxan Hockey   CHIEF COMPLAINT:  Pulmonary infiltrates, hypoxia   BRIEF PATIENT DESCRIPTION: 69yo male with hx Afib, HTN, newly dx non-small cell lung ca, s/p recent LLLobectomy 3/15, pos LNs.  He was d/c post op and returned 3/23 to the office with progressive SOB.  In office his sats were in 70's on RA and he was tx to [facility].  He was admitted by CVTS with ?HCAP v pulm edema.  He was treated with IV abx and lasix but remained SOB and CT revealed diffuse ground glass opacities, amiodarone was stopped and PCCM consulted for further recs.   SIGNIFICANT EVENTS  CTA chest 3/27>>> NEG PE, Interval development of diffuse bilateral predominately ground-glass and associated consolidative pulmonary opacities which may be secondary to multi focal infection or potentially an inflammatory process such as drug reaction. Hemorrhage could have a similar appearance. 3/29 tx to ICU increased FIO2 needs 3/31 intubated-ARDS protocol 4/1 BL pnthx- BL chest tubes    STUDIES:  2D echo 3/25>>>EF 50-55%, trivial pericardiac effusion, small L pleural effusion, trivial MR, mild Pulm regurg  SUBJECTIVE: Events overnight noted ARDS protocol, paralysed Hypotensive on pressors Poor UO   VITAL SIGNS: Temp:  [97.2 F (36.2 C)-100.2 F (37.9 C)] 100.2 F (37.9 C) (04/01 1300) Pulse Rate:  [28-105] 87 (04/01 1300) Resp:  [13-42] 23 (04/01 1300) BP: (59-149)/(32-101) 94/34 mmHg (04/01 1300) SpO2:  [77 %-100 %] 97 % (04/01 1300) FiO2 (%):  [100 %] 100 % (04/01 0927) Weight:  [84.4 kg (186 lb 1.1 oz)] 84.4 kg (186 lb 1.1 oz) (04/01 0500)  PHYSICAL EXAMINATION: General:  Pleasant, chronically ill appearing male,  Neuro:sedated , paralysed  HEENT:  Mm dry, no JVD, neck crepitus +  Cardiovascular:  s1s2 irreg  Lungs:   diminished bases otherwise fairly clear, L chest dressing  c/d .  Abdomen:  Soft, nt, nd, +bs  Musculoskeletal:  Warm and dry, no edema   Recent Labs Lab 03/11/15 0235 03/12/15 0525 03/13/15 0428  NA 140 140 143  K 4.5 4.2 5.9*  CL 107 105 109  CO2 27 25 26   BUN 31* 28* 44*  CREATININE 0.69 1.01 2.12*  GLUCOSE 184* 167* 234*    Recent Labs Lab 03/10/15 0358 03/12/15 0525 03/13/15 0428  HGB 10.1* 10.4* 10.6*  HCT 31.3* 32.4* 34.5*  WBC 14.3* 17.8* 23.8*  PLT 570* 428* 488*   Dg Chest Port 1 View  03/13/2015   CLINICAL DATA:  Central catheter placement  EXAM: PORTABLE CHEST - 1 VIEW  COMPARISON:  Study obtained earlier in the day  FINDINGS: There is a new central catheter with the tip in the superior vena cava. Endotracheal tube tip is 3.9 cm above the carina. Nasogastric tube tip and side port are in the stomach. Left central catheter tip is in the superior vena cava. There are bilateral chest tubes. The pneumothorax noted earlier in the day in the lateral right base is smaller. No new pneumothorax seen compared to earlier in the day. There is less volume loss in the right lower lobe, although there remains interstitial and patchy alveolar consolidation in this area. Somewhat greater consolidation the left lower lobe remains. Heart is enlarged but stable. Pulmonary vascularity is normal. No adenopathy. There is subcutaneous air on the right as well as probable pneumomediastinum in the neck region, stable.  IMPRESSION: New central  catheter tip in superior vena cava. Right lateral basilar pneumothorax is smaller compared to earlier in the day. The subcutaneous air in apparent pneumomediastinum remain. Less volume loss right base compared to earlier in the day. Areas of interstitial and alveolar consolidation in the lower lobes, more on the left than on the right, remain stable. Cardiomegaly is stable.   Electronically Signed   By: Lowella Grip III M.D.   On: 03/13/2015 13:20   Dg Chest Port 1 View  03/13/2015   CLINICAL DATA:  Bilateral chest  tube placement.  EXAM: PORTABLE CHEST - 1 VIEW  COMPARISON:  Study obtained earlier in the day  FINDINGS: There are bilateral chest tubes apparent. There is a pneumothorax at the lateral right base, increased from prior study. No pneumothorax is currently seen on the left. No tension component. There is extensive subcutaneous air on the right. There appears to be some pneumomediastinum in the neck region as well.  There is volume loss in the right lower lobe with interstitial and consolidation in this area. There is persistent consolidation in the left lower lobe with left effusion.  Central catheter tip is in the superior vena cava. Endotracheal tube tip is 3.3 cm above the carina.  IMPRESSION: Chest tubes have been placed bilaterally. There is an increased lateral right base pneumothorax. Given this change, close clinical and imaging surveillance remain advised. There is bibasilar consolidation with increase in volume loss in the right lower lobe compared to earlier in the day. No change in cardiac silhouette. Extensive subcutaneous air remains as does probable pneumomediastinum in the neck region.  Comment:  Dr. Elsworth Soho aware of these findings.   Electronically Signed   By: Lowella Grip III M.D.   On: 03/13/2015 09:28   Dg Chest Port 1 View  03/13/2015   ADDENDUM REPORT: 03/13/2015 07:39  ADDENDUM: Critical Value/emergent results were called by telephone at the time of interpretation on 03/13/2015 at 7:39 am to Dr. Kara Mead , who verbally acknowledged these results.   Electronically Signed   By: Lowella Grip III M.D.   On: 03/13/2015 07:39   03/13/2015   CLINICAL DATA:  Hypoxia/respiratory failure  EXAM: PORTABLE CHEST - 1 VIEW  COMPARISON:  March 12, 2015  FINDINGS: Endotracheal tube tip is 3.7 cm above the carina. Central catheter tip is in the superior vena cava near the cavoatrial junction. Nasogastric tube tip and side port are below the diaphragm. There is a small apical pneumothorax on each side.  There is extensive subcutaneous emphysema on the right. There appears to be some pneumomediastinum as well. There is airspace consolidation in the left lower lobe with small left effusion. There is mixed interstitial and patchy alveolar opacity in the right lower lobe region. Heart is enlarged but stable. Pulmonary vascular is normal. There is postoperative change in the left perihilar region.  IMPRESSION: Small pneumothoraces bilaterally. Suspect a degree of pneumomediastinum. There is subcutaneous air throughout the right hemithorax. There is airspace consolidation with small effusion on the left. There is a mix of interstitial and patchy airspace consolidation in the right lower lobe, stable. Stable cardiomegaly.  Electronically Signed: By: Lowella Grip III M.D. On: 03/13/2015 07:31   Dg Chest Port 1 View  03/12/2015   CLINICAL DATA:  Central line placement.  Nasogastric tube placement.  EXAM: PORTABLE CHEST - 1 VIEW  COMPARISON:  03/12/2015 at 1901 hours.  FINDINGS: Support apparatus: Endotracheal tube remains present with the tip 38 mm from the carina. Enteric tube  is present with the tip not visible. Proximal side port is in the region of the cardia of the stomach. New LEFT IJ central line is present with the tip at the junction of the superior vena cava and RIGHT atrium.  Cardiomediastinal Silhouette: Enlarged but unchanged. Partially obscured by airspace disease.  Lungs: Unchanged airspace disease compared to the exam earlier this evening. This is perihilar and basilar predominant and probably represents pulmonary edema No pneumothorax.  Effusions:  Small LEFT  Other:  Monitoring leads project over the chest.  IMPRESSION: 1. Uncomplicated interval placement of enteric tube and LEFT IJ central line. 2. Similar cardiomegaly and bilateral basilar predominant airspace disease most consistent with pulmonary edema.   Electronically Signed   By: Dereck Ligas M.D.   On: 03/12/2015 20:30   Portable Chest  Xray  03/12/2015   CLINICAL DATA:  Endotracheal tube placement.  EXAM: PORTABLE CHEST - 1 VIEW  COMPARISON:  Earlier film, same date.  FINDINGS: The endotracheal tube is 3.3 cm above the carina. The heart is enlarged and there is a persistent bilateral asymmetric airspace process perhaps slightly and proved after intubation. Suspect a left-sided pleural effusion.  IMPRESSION: Endotracheal tube is 3.3 cm above the carina.  Persistent bilateral airspace process, slightly improved after intubation.   Electronically Signed   By: Marijo Sanes M.D.   On: 03/12/2015 19:18   Dg Chest Port 1 View  03/12/2015   CLINICAL DATA:  Acute respiratory failure with hypoxia  EXAM: PORTABLE CHEST - 1 VIEW  COMPARISON:  Portable chest x-ray of March 11, 2015  FINDINGS: The lungs are reasonably well inflated. There is persistent dense consolidation of the mid and lower left lung. The left hemidiaphragm remains obscured. Confluent alveolar density in the right mid and lower lung is present and stable. The upper lobes are reasonably well expanded and last involved with parenchymal infiltrate. The cardiac silhouette is largely obscured. The pulmonary vascularity is not clearly engorged. The bony thorax exhibits no acute abnormality.  IMPRESSION: Widespread airspace opacities in the mid and lower lung zones little changed from yesterday's study consistent within up pneumonia, alveolar edema, or other alveolar filling process. A small to moderate size left pleural effusion persists.   Electronically Signed   By: David  Martinique   On: 03/12/2015 07:33   Dg Abd Portable 1v  03/12/2015   CLINICAL DATA:  NG tube placement.  EXAM: PORTABLE ABDOMEN - 1 VIEW  COMPARISON:  None.  FINDINGS: The NG tube tip is in the upper body region of the stomach. The proximal port is just below the GE junction. Bilateral airspace process persists.  IMPRESSION: The NG tube is in the stomach.   Electronically Signed   By: Marijo Sanes M.D.   On: 03/12/2015  20:21         ASSESSMENT / PLAN:  PULMONARY OETT 3/31 >> A: ARDS - multifactorial in setting recent LLLobectomy for NSCLCA, HCAP -treated with abx, concern for amiodarone toxicity given CT appearance. Amiodarone stopped 3/27.  Barotrauma - BL pneumothoraces & sub cut emphysema 4/1 P:   ? Amiodarone toxicity on 3/28,  solumedrol at 125 mg IV q8 for now. ARDS protocol -keep at 8 cc /kg until pH improves Chest tubes per TCTS - ensure suction  CARDIOVASCULAR CVL 3/31 >> A: septic shock Paroxysmal AF P:  Obtain echo, trop Levophed gtt Add vaso gtt Hold pradaxa - consider heparin IV if back to A fibn   RENAL A:  AKI  Hyperkalemia Acute  resp acidosis  P:   Bicarb gtt D50-insulin/ calcium given If rpt K remains high or anuric, start CRRT - Renal called  GASTROINTESTINAL A:  No issues P:   Npo for now IV PPI  HEMATOLOGIC A:  No issues P:  SQ heaprin  INFECTIOUS A:  HCAP P:    Sputum 4/1 >> Abx: zosyn /vanc 3/23 >> 4/1 Cefepime 4/1 >>  ENDOCRINE A:  Hyperglycemia   P:   SSI  NEUROLOGIC A:  Acute encephalopathy P:   RASS goal: -5 while paralysed Versed/fent gtt   FAMILY  - Updates: wife in detail 4/1  - Inter-disciplinary family meet or Palliative Care meeting due by:  4/7    TODAY'S SUMMARY: ARDS with BL infiltrates of presumed inflammatory etiology post lobectomy, now with barotrauma & multi-organ failure    The patient is critically ill with multiple organ systems failure and requires high complexity decision making for assessment and support, frequent evaluation and titration of therapies, application of advanced monitoring technologies and extensive interpretation of multiple databases. Critical Care Time devoted to patient care services described in this note independent of APP time is 90 minutes.    Rigoberto Noel MD  Kara Mead MD. FCCP. [facility] Pager [phone] If no response call [phone]    03/13/2015,  1:54 PM

## 2015-03-13 NOTE — Progress Notes (Signed)
  Echocardiogram 2D Echocardiogram has been performed.  Russell [name] 03/13/2015, 2:51 PM

## 2015-03-13 NOTE — Progress Notes (Signed)
[place] Progress Note Patient Name: Russell [name] DOB: 09/12/1946 MRN: [id number]   Date of Service  03/13/2015  HPI/Events of Note  CVP = 6. SBP remains = 80's.   eICU Interventions  0.9 NaCl IV bolus 1 liter over 1 hour now.      Intervention Category Intermediate Interventions: Hypotension - evaluation and management  Richanda Darin Eugene 03/13/2015, 3:22 AM

## 2015-03-13 NOTE — Progress Notes (Signed)
[place] Progress Note Patient Name: Russell [name] DOB: May 11, 1946 MRN: [id number]   Date of Service  03/13/2015  HPI/Events of Note  Persistent hypotension in spite of Norepinephrine and Vasopressin. Enlarged heart on CXR. R/O pericardial effusion vs pericardial tamponade.   eICU Interventions  Will order 2D Cardiac Echo STAT.      Intervention Category Major Interventions: Hypotension - evaluation and management  Sommer,Steven Eugene 03/13/2015, 5:50 AM

## 2015-03-13 NOTE — Procedures (Signed)
Hemodialysis Insertion Procedure Note Russell [name] [id number] 07-Dec-1946  Procedure: Insertion of Hemodialysis Catheter Type: 3 port  Indications: Hemodialysis   Procedure Details Consent: Risks of procedure as well as the alternatives and risks of each were explained to the (patient/caregiver).  Consent for procedure obtained. Time Out: Verified patient identification, verified procedure, site/side was marked, verified correct patient position, special equipment/implants available, medications/allergies/relevent history reviewed, required imaging and test results available.  Performed  Maximum sterile technique was used including antiseptics, cap, gloves, gown, hand hygiene, mask and sheet. Skin prep: Chlorhexidine; local anesthetic administered A antimicrobial bonded/coated triple lumen catheter was placed in the right internal jugular vein using the Seldinger technique. Ultrasound guidance used.Yes.   Catheter placed to 16 cm. Blood aspirated via all 3 ports and then flushed x 3. Line sutured x 2 and dressing applied.  Evaluation Blood flow good Complications: No apparent complications Patient did tolerate procedure well. Chest X-ray ordered to verify placement.  CXR: pending.  Richardson Landry Minor ACNP Maryanna Shape PCCM Pager [phone] till 3 pm If no answer page [phone] 03/13/2015, 12:13 PM

## 2015-03-13 NOTE — Progress Notes (Signed)
Patient's Arterial line is extremely positional and patient cuff BP have not been consistent, will attempt to Aline for drip titration for now.

## 2015-03-13 NOTE — Progress Notes (Signed)
Patient's BP dropped quickly into the 40s (Aline-Cuff-Manual, Levo drip was titrated up to 7mcg and after several minutes, BP did improve.  CCM RN Warren Lacy) notified and was present with camera during hypotensive episode.  CRRT fluid removal rate was dropped to 0cc/hr for now until BP stabilizes.  Patient has become increasingly unstable, Defib pads placed safely on patient.

## 2015-03-13 NOTE — Progress Notes (Signed)
CRITICAL VALUE ALERT  Critical value received:  Troponin 1.97  Date of notification:  03/13/15  Time of notification:  4035  Critical value read back:Yes.    Nurse who received alert:  Nicholes Rough  MD notified (1st page):  Dr. Elsworth Soho  Time of first page:  1410   Responding MD:  Dr. Elsworth Soho  Time MD responded: [phone]

## 2015-03-13 NOTE — Progress Notes (Signed)
Worsening hypoxia & WOB  After discussion with pt & family, he was intubated. PEEP increased to 15 to enable oxygenation at 88% Placed on ARDS protcol Hypotensive, hence CVL placed & pressors started. Nimbex gtt initiated with goal vent synchrony & deep sedation to RASS goal of -5. ABgs to be followed, CXR reviewed  Addn cc time x 35 m  Rigoberto Noel. MD

## 2015-03-13 NOTE — Progress Notes (Addendum)
[place] Progress Note Patient Name: Russell [name] DOB: 02-09-1946 MRN: [id number]   Date of Service  03/13/2015  HPI/Events of Note  Hypotension persists in spite of Norepinephrine IV infusion at 30 mcg/min.  eICU Interventions  Will order: 1. Vasopressin IV infusion. 2. Monitor CVP now and Q 4 hours.      Intervention Category Major Interventions: Hypotension - evaluation and management;Shock - evaluation and management  Carisha Kantor Eugene 03/13/2015, 2:26 AM

## 2015-03-13 NOTE — Progress Notes (Signed)
[place] Progress Note Patient Name: Russell [name] DOB: 02-23-1946 MRN: [id number]   Date of Service  03/13/2015  HPI/Events of Note  ABG on 100%/PRVC 28/TV 460/P 15 = 7.19/67/85/24.9.   eICU Interventions  Increase rate to 32 and check ABG at 3:30 PM.      Intervention Category Major Interventions: Respiratory failure - evaluation and management  Sommer,Steven Eugene 03/13/2015, 2:11 AM

## 2015-03-13 NOTE — Consult Note (Signed)
Requesting Physician:  Dr. Elsworth Soho Reason for Consult:  Oligoanuric AKI, hyperkalemia,  HPI: The patient is a 69 y.o. 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 Jeffie Pollock), who was readmitted to the hospital 1 week after his lobectomy with progressive SOB, hypoxemia.  He was treated for PNA and also given diuretics with progressive worsening. Contrasted chest CT (3/27) showed diffuse ground glass opacities. Amio was stopped (re ? of amio lung), steroids added. He subsequently worsened requiring intubation 3/31, and because of high PEEP developed bilateral pneumothoraces requiring chest tubes today (4/1) . He developed progressive hypotension requiring pressors over the past 24 hours despite fluid boluses. He is on a bicarb drip.  As of this AM creatinine is doubled, has developed hyperkalemia, and is making no urine. We are asked to see.  CREATININE, SER  Date/Time Value Ref Range Status  03/13/2015 04:28 AM 2.12* 0.50 - 1.35 mg/dL Final  03/12/2015 05:25 AM 1.01 0.50 - 1.35 mg/dL Final  03/11/2015 02:35 AM 0.69 0.50 - 1.35 mg/dL Final  03/08/2015 11:56 AM 1.06 0.50 - 1.35 mg/dL Final  03/07/2015 05:28 AM 1.26 0.50 - 1.35 mg/dL Final  03/06/2015 11:18 AM 1.38* 0.50 - 1.35 mg/dL Final  03/03/2015 04:58 PM 1.13 0.50 - 1.35 mg/dL Final  02/27/2015 04:15 AM 0.92 0.50 - 1.35 mg/dL Final  02/26/2015 05:00 AM 0.81 0.50 - 1.35 mg/dL Final  02/25/2015 04:30 AM 0.84 0.50 - 1.35 mg/dL Final  02/24/2015 04:00 AM 0.94 0.50 - 1.35 mg/dL Final  02/20/2015 02:42 PM 1.12 0.50 - 1.35 mg/dL Final  01/26/2015 03:44 PM 1.01 0.40 - 1.50 mg/dL Final     Past Medical History  Diagnosis Date  . Hyperlipidemia   . Depression   . Hypertension   . AAA (abdominal aortic aneurysm)     a. 01/23/15 CT: Infrarenal AAA  - 4.4cm. Monitoted by dr early  . Left ureteral calculus   . Bladder stone   . Prostate cancer     a. T2a N0 M0 Gleason 8 prostate cancer, biopsy w/  10/12 cores positve including 2 w/ Gleason  & remainder both 7 (4+3) & 6;  b. PSA 5.5;  c. CT and PET w/o evidence for prostate Ca mets.  . Frequency of urination   . Nocturia   . BPH (benign prostatic hypertrophy)   . History of kidney stones   . Arthritis   . Wears glasses   . At risk for sleep apnea     STOP-BANG= 5 SENT TO PCP 02-17-2015  . Cancer of left lung     a. 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 Post-op course complicated by bleeding (hemothorax req chest tube) and afib.  . Atrial fibrillation     a. 02/2015 noted post-op lobectomy;  b. CHA2DS2VASc = 3-->placed on pradaxa (chosen b/c of reversal available and potential for repeat surgeries).  . Multifocal atrial tachycardia      Past Surgical History  Procedure Laterality Date  . Cholecystectomy open  1992  . Cystoscopy/retrograde/ureteroscopy/stone extraction with basket Left 02/19/2015    Procedure: CYSTO WITH BLADDER STONE REMOVAL/LEFT URETEROSCOPY STONE EXTRACTION ;  Surgeon: Irine Seal, MD;  Location: [facility];  Service: Urology;  Laterality: Left;  . Prostate biopsy N/A 02/19/2015    Procedure: BIOPSY TRANSRECTAL ULTRASONIC PROSTATE (TUBP);  Surgeon: Irine Seal, MD;  Location: [facility];  Service: Urology;  Laterality: N/A;  . Video assisted thoracoscopy (vats)/wedge resection Left 02/23/2015  Procedure: LEFT VIDEO ASSISTED THORACOSCOPY ;  Surgeon: Melrose Nakayama, MD;  Location: [facility];  Service: Thoracic;  Laterality: Left;  . Lobectomy Left 02/23/2015    Procedure:  LEFT LOWER LOBECTOMY WITH NODE DISSECTION;  Surgeon: Melrose Nakayama, MD;  Location: [facility];  Service: Thoracic;  Laterality: Left;  . Lead removal Left 02/23/2015    Procedure: CRYO INTERCOSTAL NERVE BLOCK;  Surgeon: Melrose Nakayama, MD;  Location: [facility];  Service: Thoracic;  Laterality: Left;  . Video assisted thoracoscopy (vats)/ lobectomy Left 02/23/2015    Procedure: Video assisted thorocoscopy,  exploration left chest for post operative bleeding.;  Surgeon: Melrose Nakayama, MD;  Location: [facility];  Service: Thoracic;  Laterality: Left;     Family History  Problem Relation Age of Onset  . Anuerysm Father    Social History:  reports that he quit smoking about 7 years ago. His smoking use included Cigarettes. He has a 46 pack-year smoking history. He has never used smokeless tobacco. He reports that he drinks about 1.2 oz of alcohol per week. He reports that he does not use illicit drugs.  Allergies:  Allergies  Allergen Reactions  . Amiodarone Shortness Of Breath    Home medications: Prior to Admission medications   Medication Sig Start Date End Date Taking? Authorizing Provider  amiodarone (PACERONE) 400 MG tablet Take 1 tablet (400 mg total) by mouth 2 (two) times daily. For 5 days;then take Amiodarone 200 mg by mouth two times daily for one week;then take Amiodarone 200 mg by mouth daily thereafter Patient taking differently: Take 400 mg by mouth 2 (two) times daily. For 5 days;then take Amiodarone 200 mg by mouth two times daily for one week;then take Amiodarone 200 mg by mouth daily thereafter Started on 03-02-15. 03/01/15  Yes Donielle Liston Alba, PA-C  aspirin 81 MG tablet Take 81 mg by mouth daily.   Yes Historical Provider, MD  atorvastatin (LIPITOR) 20 MG tablet Take 1 tablet (20 mg total) by mouth daily at 6 PM. 03/01/15  Yes Donielle Liston Alba, PA-C  buPROPion (WELLBUTRIN) 75 MG tablet Take 150 mg by mouth 2 (two) times daily.    Yes Historical Provider, MD  dabigatran (PRADAXA) 150 MG CAPS capsule Take 1 capsule (150 mg total) by mouth every 12 (twelve) hours. 03/01/15  Yes Donielle Liston Alba, PA-C  FLUoxetine (PROZAC) 20 MG capsule Take 20 mg by mouth every morning.    Yes Historical Provider, MD  GLUCOSAMINE PO Take by mouth daily.    Yes Historical Provider, MD  metoprolol tartrate (LOPRESSOR) 25 MG tablet Take 0.5 tablets (12.5 mg total) by mouth 2 (two) times  daily. 03/01/15  Yes Donielle Liston Alba, PA-C  Omega-3 Fatty Acids (FISH OIL) 1000 MG CAPS Take 1,000 mg by mouth daily.   Yes Historical Provider, MD  losartan (COZAAR) 50 MG tablet Take 50 mg by mouth daily. 03/03/15   Historical Provider, MD  oxyCODONE (OXY IR/ROXICODONE) 5 MG immediate release tablet Take 1-2 tablets (5-10 mg total) by mouth every 3 (three) hours as needed for severe pain. Patient not taking: Reported on 02/10/2015 03/01/15   Nani Skillern, PA-C    Inpatient medications: . antiseptic oral rinse  7 mL Mouth Rinse q12n4p  . antiseptic oral rinse  7 mL Mouth Rinse QID  . artificial tears  1 application Both Eyes 3 times per day  . chlorhexidine  15 mL Mouth Rinse BID  . chlorhexidine  15 mL Mouth Rinse BID  .  docusate  100 mg Oral BID  . heparin subcutaneous  5,000 Units Subcutaneous 3 times per day  . meropenem (MERREM) IV  500 mg Intravenous Daily  . methylPREDNISolone (SOLU-MEDROL) injection  125 mg Intravenous 3 times per day  . pantoprazole (PROTONIX) IV  40 mg Intravenous Daily  . sodium chloride  3 mL Intravenous Q12H  . [START ON 03/14/2015] vancomycin  1,250 mg Intravenous Q24H  Infusions . cisatracurium (NIMBEX) infusion 7.5 mcg/kg/min (03/13/15 0730)  . fentaNYL infusion INTRAVENOUS 150 mcg/hr (03/13/15 1057)  . midazolam (VERSED) infusion 1.5 mg/hr (03/13/15 0714)  . norepinephrine (LEVOPHED) Adult infusion 34 mcg/min (03/13/15 1058)  .  sodium bicarbonate  infusion 1000 mL 75 mL/hr at 03/13/15 0410  . vasopressin (PITRESSIN) infusion - *FOR SHOCK* 0.03 Units/min (03/13/15 0253)    Review of Systems Unobtainable     Physical Exam:  BP 141/58 mmHg  Pulse 28  Temp(Src) 99.8 F (37.7 C) (Core (Comment))  Resp 32  Ht 5\' 6"  (1.676 m)  Wt 84.4 kg (186 lb 1.1 oz)  BMI 30.05 kg/m2  SpO2 100% Pale appearing bearded male Intubated, sedated, left IJ line, chest tubes Reduced air movement left chest, fairly clear right S1S2 No S3 Abd soft and  non-tender No leg edema Foley a few cc's of muddy brown urine.   Labs: Basic Metabolic Panel:  Recent Labs Lab 03/07/15 0528 03/08/15 1156 03/11/15 0235 03/12/15 0525 03/13/15 0428  NA 139 140 140 140 143  K 3.6 3.9 4.5 4.2 5.9*  CL 103 103 107 105 109  CO2 28 27 27 25 26   GLUCOSE 137* 127* 184* 167* 234*  BUN 16 14 31* 28* 44*  CREATININE 1.26 1.06 0.69 1.01 2.12*  CALCIUM 8.0* 8.5 8.4 8.2* 7.3*  PHOS  --   --   --  3.2  --      Recent Labs Lab 03/08/15 1156  AST 78*  ALT 118*  ALKPHOS 86  BILITOT 0.9  PROT 5.6*  ALBUMIN 2.0*    Recent Labs Lab 03/08/15 1156 03/10/15 0358 03/12/15 0525 03/13/15 0428  WBC 21.2* 14.3* 17.8* 23.8*  NEUTROABS 17.9*  --   --   --   HGB 10.8* 10.1* 10.4* 10.6*  HCT 33.2* 31.3* 32.4* 34.5*  MCV 89.5 89.4 88.5 92.2  PLT 594* 570* 428* 488*     Recent Labs Lab 03/10/15 1328 03/10/15 1822 03/11/15 0044  TROPONINI <0.03 <0.03 0.03   CBG:  Recent Labs Lab 03/12/15 0036 03/13/15 0900  GLUCAP 187* 280*   ABG    Component Value Date/Time   PHART 7.204* 03/13/2015 0337   PCO2ART 49.4* 03/13/2015 0337   PO2ART 69.0* 03/13/2015 0337   HCO3 19.5* 03/13/2015 0337   TCO2 21 03/13/2015 0337   ACIDBASEDEF 8.0* 03/13/2015 0337   O2SAT 89.0 03/13/2015 0337    Iron Studies: No results for input(s): IRON, TIBC, TRANSFERRIN, FERRITIN in the last 168 hours.  Xrays/Other Studies: Dg Chest Port 1 View  03/13/2015   CLINICAL DATA:  Bilateral chest tube placement.  EXAM: PORTABLE CHEST - 1 VIEW  COMPARISON:  Study obtained earlier in the day  FINDINGS: There are bilateral chest tubes apparent. There is a pneumothorax at the lateral right base, increased from prior study. No pneumothorax is currently seen on the left. No tension component. There is extensive subcutaneous air on the right. There appears to be some pneumomediastinum in the neck region as well.  There is volume loss in the right lower lobe with interstitial  and  consolidation in this area. There is persistent consolidation in the left lower lobe with left effusion.  Central catheter tip is in the superior vena cava. Endotracheal tube tip is 3.3 cm above the carina.  IMPRESSION: Chest tubes have been placed bilaterally. There is an increased lateral right base pneumothorax. Given this change, close clinical and imaging surveillance remain advised. There is bibasilar consolidation with increase in volume loss in the right lower lobe compared to earlier in the day. No change in cardiac silhouette. Extensive subcutaneous air remains as does probable pneumomediastinum in the neck region.  Comment:  Dr. Elsworth Soho aware of these findings.   Electronically Signed   By: Lowella Grip III M.D.   On: 03/13/2015 09:28   Dg Chest Port 1 View  03/13/2015   ADDENDUM REPORT: 03/13/2015 07:39  ADDENDUM: Critical Value/emergent results were called by telephone at the time of interpretation on 03/13/2015 at 7:39 am to Dr. Kara Mead , who verbally acknowledged these results.   Electronically Signed   By: Lowella Grip III M.D.   On: 03/13/2015 07:39   03/13/2015   CLINICAL DATA:  Hypoxia/respiratory failure  EXAM: PORTABLE CHEST - 1 VIEW  COMPARISON:  March 12, 2015  FINDINGS: Endotracheal tube tip is 3.7 cm above the carina. Central catheter tip is in the superior vena cava near the cavoatrial junction. Nasogastric tube tip and side port are below the diaphragm. There is a small apical pneumothorax on each side. There is extensive subcutaneous emphysema on the right. There appears to be some pneumomediastinum as well. There is airspace consolidation in the left lower lobe with small left effusion. There is mixed interstitial and patchy alveolar opacity in the right lower lobe region. Heart is enlarged but stable. Pulmonary vascular is normal. There is postoperative change in the left perihilar region.  IMPRESSION: Small pneumothoraces bilaterally. Suspect a degree of pneumomediastinum.  There is subcutaneous air throughout the right hemithorax. There is airspace consolidation with small effusion on the left. There is a mix of interstitial and patchy airspace consolidation in the right lower lobe, stable. Stable cardiomegaly.  Electronically Signed: By: Lowella Grip III M.D. On: 03/13/2015 07:31   Dg Chest Port 1 View  03/12/2015   CLINICAL DATA:  Central line placement.  Nasogastric tube placement.  EXAM: PORTABLE CHEST - 1 VIEW  COMPARISON:  03/12/2015 at 1901 hours.  FINDINGS: Support apparatus: Endotracheal tube remains present with the tip 38 mm from the carina. Enteric tube is present with the tip not visible. Proximal side port is in the region of the cardia of the stomach. New LEFT IJ central line is present with the tip at the junction of the superior vena cava and RIGHT atrium.  Cardiomediastinal Silhouette: Enlarged but unchanged. Partially obscured by airspace disease.  Lungs: Unchanged airspace disease compared to the exam earlier this evening. This is perihilar and basilar predominant and probably represents pulmonary edema No pneumothorax.  Effusions:  Small LEFT  Other:  Monitoring leads project over the chest.  IMPRESSION: 1. Uncomplicated interval placement of enteric tube and LEFT IJ central line. 2. Similar cardiomegaly and bilateral basilar predominant airspace disease most consistent with pulmonary edema.   Electronically Signed   By: Dereck Ligas M.D.   On: 03/12/2015 20:30   Portable Chest Xray  03/12/2015   CLINICAL DATA:  Endotracheal tube placement.  EXAM: PORTABLE CHEST - 1 VIEW  COMPARISON:  Earlier film, same date.  FINDINGS: The endotracheal tube is 3.3 cm above the  carina. The heart is enlarged and there is a persistent bilateral asymmetric airspace process perhaps slightly and proved after intubation. Suspect a left-sided pleural effusion.  IMPRESSION: Endotracheal tube is 3.3 cm above the carina.  Persistent bilateral airspace process, slightly  improved after intubation.   Electronically Signed   By: Marijo Sanes M.D.   On: 03/12/2015 19:18   Dg Chest Port 1 View  03/12/2015   CLINICAL DATA:  Acute respiratory failure with hypoxia  EXAM: PORTABLE CHEST - 1 VIEW  COMPARISON:  Portable chest x-ray of March 11, 2015  FINDINGS: The lungs are reasonably well inflated. There is persistent dense consolidation of the mid and lower left lung. The left hemidiaphragm remains obscured. Confluent alveolar density in the right mid and lower lung is present and stable. The upper lobes are reasonably well expanded and last involved with parenchymal infiltrate. The cardiac silhouette is largely obscured. The pulmonary vascularity is not clearly engorged. The bony thorax exhibits no acute abnormality.  IMPRESSION: Widespread airspace opacities in the mid and lower lung zones little changed from yesterday's study consistent within up pneumonia, alveolar edema, or other alveolar filling process. A small to moderate size left pleural effusion persists.   Electronically Signed   By: David  Martinique   On: 03/12/2015 07:33   Dg Abd Portable 1v  03/12/2015   CLINICAL DATA:  NG tube placement.  EXAM: PORTABLE ABDOMEN - 1 VIEW  COMPARISON:  None.  FINDINGS: The NG tube tip is in the upper body region of the stomach. The proximal port is just below the GE junction. Bilateral airspace process persists.  IMPRESSION: The NG tube is in the stomach.   Electronically Signed   By: Marijo Sanes M.D.   On: 03/12/2015 20:21   BACKGROUND  69 y.o. 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 Jeffie Pollock), who was readmitted to the hospital 1 week after his lobectomy (12/23) with progressive SOB, hypoxemia.  He was treated for PNA and also given diuretics with progressive worsening. Contrasted chest CT (3/27) showed diffuse ground glass opacities. Amio was stopped (re ? of amio lung), steroids added. He subsequently worsened requiring  intubation 3/31, and because of high PEEP developed bilateral pneumothoraces requiring chest tubes today (4/1) . He developed progressive hypotension requiring pressors over the past 24 hours despite fluid boluses. He is on a bicarb drip.  Baseline creatinine 1. As of this AM creatinine is doubled, has developed hyperkalemia, and is making no urine. We are asked to see.  Impression/Plan 1. Anuric acute renal failure -  in the setting of progressive respiratory failure/ARDS, shock on pressor support. Was on ARB PTA but not in the hospital. Had IV contrast with CT on 3/27 but creatinine did not rise until today.  Obligatory fluid intake with drips etc >150 cc/hour. Will require CRRT. No heparin. Keep volume even. All 4K fluids. Will get a renal ultrasound (only reason for this is that pt had 2 kidney stones removed recently by Dr. Jeffie Pollock - I seriously doubt any issue but will obtain to exclude) 2. Resp failure/ARDS - per CCM. Steroids, antibiotics.  3. S/p LLLobectomy for SCCA - per CCM/CTS 4. Bilateral pneumothoraces - chest tubes   Jamal Maes,  MD [facility] [phone] pager 03/13/2015, 11:34 AM

## 2015-03-13 DEATH — deceased

## 2015-03-14 ENCOUNTER — Inpatient Hospital Stay (HOSPITAL_COMMUNITY): Payer: Medicare Other

## 2015-03-14 DIAGNOSIS — I214 Non-ST elevation (NSTEMI) myocardial infarction: Secondary | ICD-10-CM

## 2015-03-14 DIAGNOSIS — N179 Acute kidney failure, unspecified: Secondary | ICD-10-CM

## 2015-03-14 DIAGNOSIS — I481 Persistent atrial fibrillation: Secondary | ICD-10-CM

## 2015-03-14 DIAGNOSIS — I429 Cardiomyopathy, unspecified: Secondary | ICD-10-CM

## 2015-03-14 DIAGNOSIS — J9383 Other pneumothorax: Secondary | ICD-10-CM

## 2015-03-14 LAB — MAGNESIUM: Magnesium: 2.4 mg/dL (ref 1.5–2.5)

## 2015-03-14 LAB — COMPREHENSIVE METABOLIC PANEL
ALK PHOS: 118 U/L — AB (ref 39–117)
ALT: 3284 U/L — AB (ref 0–53)
ANION GAP: 8 (ref 5–15)
AST: 1868 U/L — AB (ref 0–37)
Albumin: 1.9 g/dL — ABNORMAL LOW (ref 3.5–5.2)
BUN: 47 mg/dL — AB (ref 6–23)
CALCIUM: 7.3 mg/dL — AB (ref 8.4–10.5)
CO2: 31 mmol/L (ref 19–32)
CREATININE: 2.9 mg/dL — AB (ref 0.50–1.35)
Chloride: 102 mmol/L (ref 96–112)
GFR calc non Af Amer: 20 mL/min — ABNORMAL LOW (ref >90)
GFR, EST AFRICAN AMERICAN: 24 mL/min — AB (ref >90)
GLUCOSE: 216 mg/dL — AB (ref 70–99)
POTASSIUM: 4.8 mmol/L (ref 3.5–5.1)
Sodium: 141 mmol/L (ref 135–145)
TOTAL PROTEIN: 5.6 g/dL — AB (ref 6.0–8.3)
Total Bilirubin: 0.8 mg/dL (ref 0.3–1.2)

## 2015-03-14 LAB — HEPARIN LEVEL (UNFRACTIONATED): Heparin Unfractionated: <0.1 IU/mL — ABNORMAL LOW (ref 0.30–0.70)

## 2015-03-14 LAB — POCT I-STAT 3, ART BLOOD GAS (G3+)
ACID-BASE EXCESS: 3 mmol/L — AB (ref 0.0–2.0)
Acid-Base Excess: 1 mmol/L (ref 0.0–2.0)
Acid-base deficit: 10 mmol/L — ABNORMAL HIGH (ref 0.0–2.0)
BICARBONATE: 15.4 meq/L — AB (ref 20.0–24.0)
Bicarbonate: 28.5 mEq/L — ABNORMAL HIGH (ref 20.0–24.0)
Bicarbonate: 28.8 mEq/L — ABNORMAL HIGH (ref 20.0–24.0)
O2 Saturation: 98 %
O2 Saturation: 99 %
O2 Saturation: 96 %
PCO2 ART: 45 mmHg (ref 35.0–45.0)
PH ART: 7.288 — AB (ref 7.350–7.450)
PO2 ART: 79 mmHg — AB (ref 80.0–100.0)
Patient temperature: 97.7
TCO2: 30 mmol/L (ref 0–100)
TCO2: 16 mmol/L (ref 0–100)
TCO2: 30 mmol/L (ref 0–100)
pCO2 arterial: 57.4 mmHg (ref 35.0–45.0)
pCO2 arterial: 32 mmHg — ABNORMAL LOW (ref 35.0–45.0)
pH, Arterial: 7.303 — ABNORMAL LOW (ref 7.350–7.450)
pH, Arterial: 7.408 (ref 7.350–7.450)
pO2, Arterial: 119 mmHg — ABNORMAL HIGH (ref 80.0–100.0)
pO2, Arterial: 131 mmHg — ABNORMAL HIGH (ref 80.0–100.0)

## 2015-03-14 LAB — CBC
HEMATOCRIT: 37.1 % — AB (ref 39.0–52.0)
Hemoglobin: 11.5 g/dL — ABNORMAL LOW (ref 13.0–17.0)
MCH: 27.9 pg (ref 26.0–34.0)
MCHC: 31 g/dL (ref 30.0–36.0)
MCV: 90 fL (ref 78.0–100.0)
PLATELETS: 316 10*3/uL (ref 150–400)
RBC: 4.12 MIL/uL — AB (ref 4.22–5.81)
RDW: 14 % (ref 11.5–15.5)
WBC: 24.2 10*3/uL — ABNORMAL HIGH (ref 4.0–10.5)

## 2015-03-14 LAB — GLUCOSE, CAPILLARY
GLUCOSE-CAPILLARY: 194 mg/dL — AB (ref 70–99)
GLUCOSE-CAPILLARY: 183 mg/dL — AB (ref 70–99)
GLUCOSE-CAPILLARY: 173 mg/dL — AB (ref 70–99)
GLUCOSE-CAPILLARY: 178 mg/dL — AB (ref 70–99)
Glucose-Capillary: 192 mg/dL — ABNORMAL HIGH (ref 70–99)

## 2015-03-14 LAB — RENAL FUNCTION PANEL
Albumin: 1.8 g/dL — ABNORMAL LOW (ref 3.5–5.2)
Anion gap: 14 (ref 5–15)
BUN: 52 mg/dL — ABNORMAL HIGH (ref 6–23)
CALCIUM: 7.2 mg/dL — AB (ref 8.4–10.5)
CHLORIDE: 101 mmol/L (ref 96–112)
CO2: 26 mmol/L (ref 19–32)
Creatinine, Ser: 3.07 mg/dL — ABNORMAL HIGH (ref 0.50–1.35)
GFR, EST AFRICAN AMERICAN: 22 mL/min — AB (ref >90)
GFR, EST NON AFRICAN AMERICAN: 19 mL/min — AB (ref >90)
GLUCOSE: 195 mg/dL — AB (ref 70–99)
PHOSPHORUS: 5.1 mg/dL — AB (ref 2.3–4.6)
Potassium: 4.5 mmol/L (ref 3.5–5.1)
SODIUM: 141 mmol/L (ref 135–145)

## 2015-03-14 LAB — TROPONIN I: Troponin I: 0.8 ng/mL (ref <0.031)

## 2015-03-14 LAB — PROCALCITONIN: PROCALCITONIN: 1.67 ng/mL

## 2015-03-14 LAB — PROTIME-INR
INR: 2.24 — ABNORMAL HIGH (ref 0.00–1.49)
Prothrombin Time: 25 seconds — ABNORMAL HIGH (ref 11.6–15.2)

## 2015-03-14 LAB — APTT: aPTT: 35 seconds (ref 24–37)

## 2015-03-14 LAB — PHOSPHORUS
Phosphorus: 6.8 mg/dL — ABNORMAL HIGH (ref 2.3–4.6)
Phosphorus: 4.6 mg/dL (ref 2.3–4.6)

## 2015-03-14 MED ORDER — HEPARIN BOLUS VIA INFUSION (CRRT)
1000.0000 [IU] | INTRAVENOUS | Status: DC | PRN
  Filled 2015-03-14: qty 1000

## 2015-03-14 MED ORDER — PRO-STAT SUGAR FREE PO LIQD
30.0000 mL | Freq: Every day | ORAL | Status: DC
  Administered 2015-03-15 – 2015-03-16 (×2): 30 mL
  Administered 2015-03-17: 10:00:00
  Filled 2015-03-14 (×4): qty 30

## 2015-03-14 MED ORDER — PRO-STAT SUGAR FREE PO LIQD
30.0000 mL | Freq: Two times a day (BID) | ORAL | Status: AC
  Administered 2015-03-14 (×2): 30 mL
  Filled 2015-03-14 (×2): qty 30

## 2015-03-14 MED ORDER — HEPARIN SODIUM (PORCINE) 5000 UNIT/ML IJ SOLN
250.0000 [IU]/h | INTRAMUSCULAR | Status: DC
  Filled 2015-03-14: qty 2

## 2015-03-14 MED ORDER — PRO-STAT SUGAR FREE PO LIQD: 30.0000 mL | Freq: Every day | ORAL | Status: DC

## 2015-03-14 MED ORDER — HEPARIN SODIUM (PORCINE) 1000 UNIT/ML DIALYSIS
1000.0000 [IU] | INTRAMUSCULAR | Status: DC | PRN
  Filled 2015-03-14: qty 4
  Filled 2015-03-14: qty 6

## 2015-03-14 MED ORDER — VITAL HIGH PROTEIN PO LIQD
1000.0000 mL | ORAL | Status: DC
  Administered 2015-03-14: 1000 mL
  Filled 2015-03-14 (×4): qty 1000

## 2015-03-14 MED ORDER — VITAL HIGH PROTEIN PO LIQD
1000.0000 mL | ORAL | Status: DC
  Filled 2015-03-14 (×2): qty 1000

## 2015-03-14 MED ORDER — INSULIN GLARGINE 100 UNIT/ML ~~LOC~~ SOLN
10.0000 [IU] | Freq: Every day | SUBCUTANEOUS | Status: DC
  Administered 2015-03-14 – 2015-03-16 (×3): 10 [IU] via SUBCUTANEOUS
  Filled 2015-03-14 (×3): qty 0.1

## 2015-03-14 MED ORDER — ALTEPLASE 2 MG IJ SOLR
4.0000 mg | Freq: Once | INTRAMUSCULAR | Status: AC | PRN
  Administered 2015-03-14: 4 mg
  Filled 2015-03-14 (×2): qty 4

## 2015-03-14 MED ORDER — HEPARIN (PORCINE) IN NACL 100-0.45 UNIT/ML-% IJ SOLN
1850.0000 [IU]/h | INTRAMUSCULAR | Status: DC
  Administered 2015-03-14: 1000 [IU]/h via INTRAVENOUS
  Administered 2015-03-15: 1700 [IU]/h via INTRAVENOUS
  Administered 2015-03-15: 1500 [IU]/h via INTRAVENOUS
  Filled 2015-03-14 (×8): qty 250

## 2015-03-14 NOTE — Progress Notes (Addendum)
ANTICOAGULATION CONSULT NOTE - Initial Consult  Pharmacy Consult for Heparin Indication: ACS/Afib  Allergies  Allergen Reactions  . Amiodarone Shortness Of Breath   Patient Measurements: Height: 5\' 6"  (167.6 cm) Weight: 189 lb 2.5 oz (85.8 kg) IBW/kg (Calculated) : 63.8 Heparin Dosing Weight: 81.6  Vital Signs: Temp: 97.7 F (36.5 C) (04/02 0700) Temp Source: Core (Comment) (04/02 0400) BP: 109/68 mmHg (04/02 0700) Pulse Rate: 95 (04/02 0346)  Labs:  Recent Labs  03/12/15 0525 03/13/15 0428 03/13/15 1230 03/13/15 1717 03/14/15 0328  HGB 10.4* 10.6*  --   --  11.5*  HCT 32.4* 34.5*  --   --  37.1*  PLT 428* 488*  --   --  316  CREATININE 1.01 2.12* 2.91* 3.04* 2.90*  TROPONINI  --   --  1.97*  --  0.80*   Estimated Creatinine Clearance: 24.3 mL/min (by C-G formula based on Cr of 2.9).  Medical History: Past Medical History  Diagnosis Date  . Hyperlipidemia   . Depression   . Hypertension   . AAA (abdominal aortic aneurysm)     a. 01/23/15 CT: Infrarenal AAA  - 4.4cm. Monitoted by dr early  . Left ureteral calculus   . Bladder stone   . Prostate cancer     a. T2a N0 M0 Gleason 8 prostate cancer, biopsy w/ 10/12 cores positve including 2 w/ Gleason  & remainder both 7 (4+3) & 6;  b. PSA 5.5;  c. CT and PET w/o evidence for prostate Ca mets.  . Frequency of urination   . Nocturia   . BPH (benign prostatic hypertrophy)   . History of kidney stones   . Arthritis   . Wears glasses   . At risk for sleep apnea     STOP-BANG= 5 SENT TO PCP 02-17-2015  . Cancer of left lung     a. 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 Post-op course complicated by bleeding (hemothorax req chest tube) and afib.  . Atrial fibrillation     a. 02/2015 noted post-op lobectomy;  b. CHA2DS2VASc = 3-->placed on pradaxa (chosen b/c of reversal available and potential for repeat surgeries).  . Multifocal atrial tachycardia    Medications:  Infusions:  . cisatracurium (NIMBEX)  infusion 3 mcg/kg/min (03/14/15 0530)  . fentaNYL infusion INTRAVENOUS 150 mcg/hr (03/14/15 0530)  . heparin 10,000 units/ 20 mL infusion syringe    . heparin    . midazolam (VERSED) infusion 1.5 mg/hr (03/13/15 1900)  . norepinephrine (LEVOPHED) Adult infusion 30 mcg/min (03/14/15 0651)  . dialysis replacement fluid (prismasate) 400 mL/hr at 03/14/15 0422  . dialysis replacement fluid (prismasate) 200 mL/hr at 03/13/15 1530  . dialysate (PRISMASATE) 1,000 mL/hr at 03/14/15 0207  .  sodium bicarbonate  infusion 1000 mL 75 mL/hr at 03/13/15 1730  . vasopressin (PITRESSIN) infusion - *FOR SHOCK* 0.03 Units/min (03/13/15 2045)    Assessment: 69yo male admitted with hx. Afib and recently diagnosed with NSCLC.  He was on Pradaxa prior to admit.  His troponin has increased and he now has possible new MI.  We have been asked to start IV heparin for maintenance. He recently had a lobectomy, currently intubated and sedated due to ARDS.  He received one dose of SQ heparin at 05AM, has heparin with his CRRT as well.  We will strive for lower end of goal range to avoid increased risk for bleeding.  His current CBC and platelets are WNL and no noted bleeding today.    Goal of  Therapy:  Heparin level 0.3-0.5 Monitor platelets by anticoagulation protocol: Yes   Plan:  - Begin IV heparin at 1000 units/hr - Will use heparin levels but draw one aPTT for correlation since he has been off Pradaxa a couple of days -  Monitor for bleeding complications  Rober Minion, PharmD., MS Clinical Pharmacist Pager:  [phone] Thank you for allowing pharmacy to be part of this patients care team. 03/14/2015,8:56 AM  Addendum: -aPTT level = 35sec which is within normal range. Will monitor with heparin levels only.  Rober Minion, PharmD., MS

## 2015-03-14 NOTE — Progress Notes (Signed)
      [address]       [place] [address]             [phone]        CARDIOTHORACIC SURGERY PROGRESS NOTE  Subjective: Paralyzed and sedated on vent  Objective: Vital signs: BP Readings from Last 1 Encounters:  03/14/15 86/49   Pulse Readings from Last 1 Encounters:  03/14/15 111   Resp Readings from Last 1 Encounters:  03/14/15 30   Temp Readings from Last 1 Encounters:  03/14/15 97.7 F (36.5 C)     Hemodynamics: CVP:  [9 mmHg-11 mmHg] 9 mmHg  Physical Exam:  Rhythm:   sinus  Breath sounds: coarse  Heart sounds:  RRR  Incisions:  Clean and dry  Abdomen:  Soft, non distended  Extremities:  Cool but adequately perfused  Chest tubes:  Low volume thin serosanguinous output, no air leak    Intake/Output from previous day: 04/01 0701 - 04/02 0700 In: 4509.1 [I.V.:3939.1; NG/GT:30; IV Piggyback:210] Out: 3058 [Urine:35; Chest Tube:920] Intake/Output this shift:    Lab Results:  CBC: Recent Labs  03/13/15 0428 03/14/15 0328  WBC 23.8* 24.2*  HGB 10.6* 11.5*  HCT 34.5* 37.1*  PLT 488* 316    BMET:  Recent Labs  03/13/15 1717 03/14/15 0328  NA 145 141  K 4.9 4.8  CL 103 102  CO2 26 31  GLUCOSE 280* 216*  BUN 54* 47*  CREATININE 3.04* 2.90*  CALCIUM 7.2* 7.3*     CBG (last 3)   Recent Labs  03/13/15 2311 03/14/15 0337 03/14/15 0843  GLUCAP 211* 192* 194*    ABG    Component Value Date/Time   PHART 7.288* 03/14/2015 1115   PCO2ART 32.0* 03/14/2015 1115   PO2ART 131.0* 03/14/2015 1115   HCO3 15.4* 03/14/2015 1115   TCO2 16 03/14/2015 1115   ACIDBASEDEF 10.0* 03/14/2015 1115   O2SAT 99.0 03/14/2015 1115    CXR:  PORTABLE CHEST - 1 VIEW  COMPARISON: 03/13/2015  FINDINGS: Right lateral basilar pneumothorax seen on the previous day study is not currently evident, although the difference may be due to differences in positioning.  Right subcutaneous emphysema is stable. Support apparatus  including bilateral chest tubes, endotracheal tube, nasogastric tube, left internal jugular and right internal jugular central venous lines are all stable.  Dense consolidation is noted in the left lower lung zone. More hazy airspace opacity is noted in the right lower lung zone. Right lower lung zone opacity appears mildly improved. No other change.  IMPRESSION: 1. Mild improvement in airspace lung opacity at the right lung base. No new lung abnormalities. 2. Small lateral basilar pneumothorax seen on the previous day study is not evident currently. It may be improved although the difference could be positional on this semi-erect study. No other evidence of a change. 3. Stable support apparatus.   Electronically Signed  By: Lajean Manes M.D.  On: 03/14/2015 08:32       Assessment/Plan:  Overall stable over last 24 hours although remains critically ill Oxygenation adequate on FiO2 80% and PEEP 15 Pneumothorax appears to have resolved w/ chest tubes, CXR looks somewhat improved NSR w/ stable hemodynamics but requiring levophed and vasopressin for BP support Remains in oliguric ARF on CVVHD, keeping I/O's even for now   Continue plan as outlined per Pulm/CCM team   Rexene Alberts 03/14/2015 12:08 PM

## 2015-03-14 NOTE — Progress Notes (Signed)
Spoke to on call Renal MD. New order to start Heparin through CRRT.

## 2015-03-14 NOTE — Progress Notes (Signed)
Blue port on HD cath is sluggish to flush and unable to pull back blood. Return pressures on CRRT are high and CRRT unable to run smoothly. Per protocol TPA ordered. IV team aware.  Blood returned to pt and TPA to be instilled by IV team

## 2015-03-14 NOTE — Progress Notes (Signed)
NUTRITION FOLLOW UP/CONSULT  DOCUMENTATION CODES Per approved criteria  -Obesity Unspecified   INTERVENTION: Initiate TF via OGT with Vital High Protein at 25 ml/h and Prostat 30 ml BID on day 1; on day 2, increase to goal rate of 55 ml/h (1320 ml per day) and provide Prostat once daily to provide 1420 kcals, 131 gm protein, 1404 ml free water daily.  NUTRITION DIAGNOSIS: Inadequate oral intake related to inability to eat as evidenced by NPO/Vent status; ongoing  Goal: Enteral nutrition to provide 65-70% of estimated calorie needs based on ASPEN guidelines for hypocaloric, high protein feeding in critically ill obese individuals; not met  Monitor:  Vent status, TF initiation/tolerance, weight trend, labs  69 y.o. male  Admitting Dx: <principal problem not specified>  ASSESSMENT: 69yo male with hx Afib, HTN, newly dx non-small cell lung ca, s/p recent LLLobectomy 3/15, pos LNs. He was d/c post op and returned 3/23 to the office with progressive SOB. Pt was intubated 3/31. OGT in place. RD consulted for enteral/tube feeding initiation and management. Pt started on CRRT 4/1. Noted pt with shock liver.  Patient is currently intubated on ventilator support MV: 15.7 L/min Temp (24hrs), Avg:98.4 F (36.9 C), Min:96.5 F (35.8 C), Max:100.7 F (38.2 C)  Propofol: none  Labs: Low calcium and GFR. High BUN, creatinine, AST, ALT, alkaline phosphatase.  Height: Ht Readings from Last 1 Encounters:  02/16/2015 5' 6"  (1.676 m)    Weight: Wt Readings from Last 1 Encounters:  03/14/15 189 lb 2.5 oz (85.8 kg)    BMI:  Body mass index is 30.54 kg/(m^2). Class I obesity  Re-Estimated Nutritional Needs: Kcal: 2168 (Underfeeding goal: 6811-5726) Protein: 130-145 grams Fluid: 2.1 L/day  Skin: closed incision on left chest, +2 generalized edema  Diet Order: Diet NPO time specified   Intake/Output Summary (Last 24 hours) at 03/14/15 0844 Last data filed at 03/14/15 0700  Gross  per 24 hour  Intake 4262.18 ml  Output   2408 ml  Net 1854.18 ml    Last BM: 3/31  Labs:   Recent Labs Lab 03/12/15 0525  03/13/15 1230 03/13/15 1717 03/14/15 0328  NA 140  < > 146* 145 141  K 4.2  < > 5.2* 4.9 4.8  CL 105  < > 105 103 102  CO2 25  < > 28 26 31   BUN 28*  < > 55* 54* 47*  CREATININE 1.01  < > 2.91* 3.04* 2.90*  CALCIUM 8.2*  < > 7.3* 7.2* 7.3*  MG 2.4  --   --   --  2.4  PHOS 3.2  --   --  7.1*  --   GLUCOSE 167*  < > 286* 280* 216*  < > = values in this interval not displayed.  CBG (last 3)   Recent Labs  03/13/15 2002 03/13/15 2311 03/14/15 0337  GLUCAP 235* 211* 192*    Scheduled Meds: . antiseptic oral rinse  7 mL Mouth Rinse q12n4p  . antiseptic oral rinse  7 mL Mouth Rinse QID  . artificial tears  1 application Both Eyes 3 times per day  . aspirin  81 mg Per Tube Daily  . chlorhexidine  15 mL Mouth Rinse BID  . docusate  100 mg Oral BID  . feeding supplement (VITAL HIGH PROTEIN)  1,000 mL Per Tube Q24H  . heparin subcutaneous  5,000 Units Subcutaneous 3 times per day  . insulin aspart  0-20 Units Subcutaneous 6 times per day  . insulin glargine  10 Units Subcutaneous Daily  . meropenem (MERREM) IV  1 g Intravenous Q12H  . methylPREDNISolone (SOLU-MEDROL) injection  125 mg Intravenous 3 times per day  . pantoprazole (PROTONIX) IV  40 mg Intravenous Daily  . sodium chloride  3 mL Intravenous Q12H  . vancomycin  750 mg Intravenous Q24H    Continuous Infusions: . cisatracurium (NIMBEX) infusion 3 mcg/kg/min (03/14/15 0530)  . fentaNYL infusion INTRAVENOUS 150 mcg/hr (03/14/15 0530)  . heparin 10,000 units/ 20 mL infusion syringe    . midazolam (VERSED) infusion 1.5 mg/hr (03/13/15 1900)  . norepinephrine (LEVOPHED) Adult infusion 30 mcg/min (03/14/15 0651)  . dialysis replacement fluid (prismasate) 400 mL/hr at 03/14/15 0422  . dialysis replacement fluid (prismasate) 200 mL/hr at 03/13/15 1530  . dialysate (PRISMASATE) 1,000 mL/hr at  03/14/15 0207  .  sodium bicarbonate  infusion 1000 mL 75 mL/hr at 03/13/15 1730  . vasopressin (PITRESSIN) infusion - *FOR SHOCK* 0.03 Units/min (03/13/15 2045)    Past Medical History  Diagnosis Date  . Hyperlipidemia   . Depression   . Hypertension   . AAA (abdominal aortic aneurysm)     a. 01/23/15 CT: Infrarenal AAA  - 4.4cm. Monitoted by dr early  . Left ureteral calculus   . Bladder stone   . Prostate cancer     a. T2a N0 M0 Gleason 8 prostate cancer, biopsy w/ 10/12 cores positve including 2 w/ Gleason  & remainder both 7 (4+3) & 6;  b. PSA 5.5;  c. CT and PET w/o evidence for prostate Ca mets.  . Frequency of urination   . Nocturia   . BPH (benign prostatic hypertrophy)   . History of kidney stones   . Arthritis   . Wears glasses   . At risk for sleep apnea     STOP-BANG= 5 SENT TO PCP 02-17-2015  . Cancer of left lung     a. 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 Post-op course complicated by bleeding (hemothorax req chest tube) and afib.  . Atrial fibrillation     a. 02/2015 noted post-op lobectomy;  b. CHA2DS2VASc = 3-->placed on pradaxa (chosen b/c of reversal available and potential for repeat surgeries).  . Multifocal atrial tachycardia     Past Surgical History  Procedure Laterality Date  . Cholecystectomy open  1992  . Cystoscopy/retrograde/ureteroscopy/stone extraction with basket Left 02/19/2015    Procedure: CYSTO WITH BLADDER STONE REMOVAL/LEFT URETEROSCOPY STONE EXTRACTION ;  Surgeon: Irine Seal, MD;  Location: [facility];  Service: Urology;  Laterality: Left;  . Prostate biopsy N/A 02/19/2015    Procedure: BIOPSY TRANSRECTAL ULTRASONIC PROSTATE (TUBP);  Surgeon: Irine Seal, MD;  Location: [facility];  Service: Urology;  Laterality: N/A;  . Video assisted thoracoscopy (vats)/wedge resection Left 02/23/2015    Procedure: LEFT VIDEO ASSISTED THORACOSCOPY ;  Surgeon: Melrose Nakayama, MD;  Location: [place];  Service:  Thoracic;  Laterality: Left;  . Lobectomy Left 02/23/2015    Procedure:  LEFT LOWER LOBECTOMY WITH NODE DISSECTION;  Surgeon: Melrose Nakayama, MD;  Location: [place];  Service: Thoracic;  Laterality: Left;  . Lead removal Left 02/23/2015    Procedure: CRYO INTERCOSTAL NERVE BLOCK;  Surgeon: Melrose Nakayama, MD;  Location: [place];  Service: Thoracic;  Laterality: Left;  . Video assisted thoracoscopy (vats)/ lobectomy Left 02/23/2015    Procedure: Video assisted thorocoscopy, exploration left chest for post operative bleeding.;  Surgeon: Melrose Nakayama, MD;  Location: [place];  Service: Thoracic;  Laterality: Left;    Kallie Locks, MS, RD, LDN Pager # [phone] After hours/ weekend pager # [phone]

## 2015-03-14 NOTE — Progress Notes (Signed)
ANTICOAGULATION CONSULT NOTE - Follow Up  Pharmacy Consult for Heparin Indication: ACS/Afib  Allergies  Allergen Reactions  . Amiodarone Shortness Of Breath   Patient Measurements: Height: 5\' 6"  (167.6 cm) Weight: 189 lb 2.5 oz (85.8 kg) IBW/kg (Calculated) : 63.8 Heparin Dosing Weight: 81.6  Vital Signs: Temp: 96.9 F (36.1 C) (04/02 1800) BP: 140/62 mmHg (04/02 1800) Pulse Rate: 93 (04/02 1547)  Labs:  Recent Labs  03/12/15 0525 03/13/15 0428 03/13/15 1230 03/13/15 1717 03/14/15 0328 03/14/15 0830 03/14/15 1643 03/14/15 1833  HGB 10.4* 10.6*  --   --  11.5*  --   --   --   HCT 32.4* 34.5*  --   --  37.1*  --   --   --   PLT 428* 488*  --   --  316  --   --   --   APTT  --   --   --   --   --  35  --   --   LABPROT  --   --   --   --   --  25.0*  --   --   INR  --   --   --   --   --  2.24*  --   --   HEPARINUNFRC  --   --   --   --   --   --   --  <0.10*  CREATININE 1.01 2.12* 2.91* 3.04* 2.90*  --  3.07*  --   TROPONINI  --   --  1.97*  --  0.80*  --   --   --    Estimated Creatinine Clearance: 23 mL/min (by C-G formula based on Cr of 3.07).  Medical History: Past Medical History  Diagnosis Date  . Hyperlipidemia   . Depression   . Hypertension   . AAA (abdominal aortic aneurysm)     a. 01/23/15 CT: Infrarenal AAA  - 4.4cm. Monitoted by dr early  . Left ureteral calculus   . Bladder stone   . Prostate cancer     a. T2a N0 M0 Gleason 8 prostate cancer, biopsy w/ 10/12 cores positve including 2 w/ Gleason  & remainder both 7 (4+3) & 6;  b. PSA 5.5;  c. CT and PET w/o evidence for prostate Ca mets.  . Frequency of urination   . Nocturia   . BPH (benign prostatic hypertrophy)   . History of kidney stones   . Arthritis   . Wears glasses   . At risk for sleep apnea     STOP-BANG= 5 SENT TO PCP 02-17-2015  . Cancer of left lung     a. 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 Post-op course complicated by bleeding (hemothorax req chest tube) and  afib.  . Atrial fibrillation     a. 02/2015 noted post-op lobectomy;  b. CHA2DS2VASc = 3-->placed on pradaxa (chosen b/c of reversal available and potential for repeat surgeries).  . Multifocal atrial tachycardia    Medications:  Infusions:  . cisatracurium (NIMBEX) infusion 3 mcg/kg/min (03/14/15 0530)  . feeding supplement (VITAL HIGH PROTEIN) 1,000 mL (03/14/15 1205)  . fentaNYL infusion INTRAVENOUS 150 mcg/hr (03/14/15 0530)  . heparin 10,000 units/ 20 mL infusion syringe    . heparin 1,000 Units/hr (03/14/15 1004)  . midazolam (VERSED) infusion 1.5 mg/hr (03/13/15 1900)  . norepinephrine (LEVOPHED) Adult infusion 25 mcg/min (03/14/15 1822)  . dialysis replacement fluid (prismasate) 400 mL/hr at 03/14/15 0422  . dialysis  replacement fluid (prismasate) 200 mL/hr at 03/13/15 1530  . dialysate (PRISMASATE) 1,000 mL/hr at 03/14/15 1153  .  sodium bicarbonate  infusion 1000 mL 75 mL/hr at 03/14/15 0953  . vasopressin (PITRESSIN) infusion - *FOR SHOCK* 0.03 Units/min (03/13/15 2045)    Assessment: 69yo male admitted with hx. Afib and recently diagnosed with NSCLC.  He was on Pradaxa prior to admit.  His troponin has increased and he now has possible new MI.  We have been asked to start IV heparin for maintenance.  His initial anti-Xa level is low at < 0.1 on IV heparin rate of 1000 units/hr.  No noted bleeding complications.  Spoke with his RN who reports that drip has been infusing without noted problems.  Goal of Therapy:  Heparin level 0.3-0.5 Monitor platelets by anticoagulation protocol: Yes   Plan:  - Increase IV heparin to 1250 units/hr - Will use heparin levels  -  Monitor for bleeding complications  Rober Minion, PharmD., MS Clinical Pharmacist Pager:  [phone] Thank you for allowing pharmacy to be part of this patients care team. 03/14/2015,8:08 PM

## 2015-03-14 NOTE — Progress Notes (Signed)
[facility] Rounding Note  Subjective:  Remains intubated and sedated Filter and catheter issue with CRRT - heparin added/TPA to catheter right now Remains pressor dependent Poor UOP 35 cc/24 hours   Objective Vital signs in last 24 hours: Filed Vitals:   03/14/15 0615 03/14/15 0630 03/14/15 0645 03/14/15 0700  BP: 104/79 87/65 129/82 109/68  Pulse:      Temp: 97.8 F (36.6 C) 97.7 F (36.5 C) 97.7 F (36.5 C) 97.7 F (36.5 C)  TempSrc:      Resp: 28 35 35 35  Height:      Weight:      SpO2:       Weight change: 1.4 kg (3 lb 1.4 oz)  Intake/Output Summary (Last 24 hours) at 03/14/15 0904 Last data filed at 03/14/15 0700  Gross per 24 hour  Intake 4125.58 ml  Output   2408 ml  Net 1717.58 ml   Physical Exam:  BP 109/68 mmHg  Pulse 95  Temp(Src) 97.7 F (36.5 C) (Core (Comment))  Resp 35  Ht 5\' 6"  (1.676 m)  Wt 85.8 kg (189 lb 2.5 oz)  BMI 30.54 kg/m2  SpO2 100%  General: Intubated, ill appearing R IJ HD cath, L IJ line, ETT, NG tube,  Neuro:sedated and paralyzed  HEENT:No  JVD, +Crepitus in the neck  Cardiovascular: S1S2  irreg No S3 Heart sounds distant Lungs: diminished bases otherwise fairly clear anteriorly Abd soft some BS No  Edema of LE's Foley with small amt urine   Labs: Basic Metabolic Panel:  Recent Labs Lab 03/08/15 1156 03/11/15 0235 03/12/15 0525 03/13/15 0428 03/13/15 1230 03/13/15 1717 03/14/15 0328  NA 140 140 140 143 146* 145 141  K 3.9 4.5 4.2 5.9* 5.2* 4.9 4.8  CL 103 107 105 109 105 103 102  CO2 27 27 25 26 28 26 31   GLUCOSE 127* 184* 167* 234* 286* 280* 216*  BUN 14 31* 28* 44* 55* 54* 47*  CREATININE 1.06 0.69 1.01 2.12* 2.91* 3.04* 2.90*  CALCIUM 8.5 8.4 8.2* 7.3* 7.3* 7.2* 7.3*  PHOS  --   --  3.2  --   --  7.1*  --       Recent Labs Lab 03/08/15 1156 03/13/15 1717 03/14/15 0328  AST 78*  --  1868*  ALT 118*  --  3284*  ALKPHOS 86  --  118*  BILITOT 0.9  --  0.8  PROT 5.6*  --  5.6*   ALBUMIN 2.0* 1.9* 1.9*    :  Recent Labs Lab 03/08/15 1156 03/10/15 0358 03/12/15 0525 03/13/15 0428 03/14/15 0328  WBC 21.2* 14.3* 17.8* 23.8* 24.2*  NEUTROABS 17.9*  --   --   --   --   HGB 10.8* 10.1* 10.4* 10.6* 11.5*  HCT 33.2* 31.3* 32.4* 34.5* 37.1*  MCV 89.5 89.4 88.5 92.2 90.0  PLT 594* 570* 428* 488* 316     Recent Labs Lab 03/10/15 1328 03/10/15 1822 03/11/15 0044 03/13/15 1230 03/14/15 0328  TROPONINI <0.03 <0.03 0.03 1.97* 0.80*      Recent Labs Lab 03/13/15 1224 03/13/15 1723 03/13/15 2002 03/13/15 2311 03/14/15 0337  GLUCAP 243* 270* 235* 211* 192*    Studies/Results: Dg Chest Port 1 View  03/14/2015   CLINICAL DATA:  ARDSHx of cancer, HTN, AAA, atrial fibrillation, multifocal atrial tachycardia  EXAM: PORTABLE CHEST - 1 VIEW  COMPARISON:  03/13/2015  FINDINGS: Right lateral basilar pneumothorax seen on the previous day study is not currently evident, although  the difference may be due to differences in positioning.  Right subcutaneous emphysema is stable. Support apparatus including bilateral chest tubes, endotracheal tube, nasogastric tube, left internal jugular and right internal jugular central venous lines are all stable.  Dense consolidation is noted in the left lower lung zone. More hazy airspace opacity is noted in the right lower lung zone. Right lower lung zone opacity appears mildly improved. No other change.  IMPRESSION: 1. Mild improvement in airspace lung opacity at the right lung base. No new lung abnormalities. 2. Small lateral basilar pneumothorax seen on the previous day study is not evident currently. It may be improved although the difference could be positional on this semi-erect study. No other evidence of a change. 3. Stable support apparatus.   Electronically Signed   By: Lajean Manes M.D.   On: 03/14/2015 08:32   Dg Chest Port 1 View  03/13/2015   CLINICAL DATA:  Central catheter placement  EXAM: PORTABLE CHEST - 1 VIEW   COMPARISON:  Study obtained earlier in the day  FINDINGS: There is a new central catheter with the tip in the superior vena cava. Endotracheal tube tip is 3.9 cm above the carina. Nasogastric tube tip and side port are in the stomach. Left central catheter tip is in the superior vena cava. There are bilateral chest tubes. The pneumothorax noted earlier in the day in the lateral right base is smaller. No new pneumothorax seen compared to earlier in the day. There is less volume loss in the right lower lobe, although there remains interstitial and patchy alveolar consolidation in this area. Somewhat greater consolidation the left lower lobe remains. Heart is enlarged but stable. Pulmonary vascularity is normal. No adenopathy. There is subcutaneous air on the right as well as probable pneumomediastinum in the neck region, stable.  IMPRESSION: New central catheter tip in superior vena cava. Right lateral basilar pneumothorax is smaller compared to earlier in the day. The subcutaneous air in apparent pneumomediastinum remain. Less volume loss right base compared to earlier in the day. Areas of interstitial and alveolar consolidation in the lower lobes, more on the left than on the right, remain stable. Cardiomegaly is stable.   Electronically Signed   By: Lowella Grip III M.D.   On: 03/13/2015 13:20   Dg Chest Port 1 View  03/13/2015   CLINICAL DATA:  Bilateral chest tube placement.  EXAM: PORTABLE CHEST - 1 VIEW  COMPARISON:  Study obtained earlier in the day  FINDINGS: There are bilateral chest tubes apparent. There is a pneumothorax at the lateral right base, increased from prior study. No pneumothorax is currently seen on the left. No tension component. There is extensive subcutaneous air on the right. There appears to be some pneumomediastinum in the neck region as well.  There is volume loss in the right lower lobe with interstitial and consolidation in this area. There is persistent consolidation in the  left lower lobe with left effusion.  Central catheter tip is in the superior vena cava. Endotracheal tube tip is 3.3 cm above the carina.  IMPRESSION: Chest tubes have been placed bilaterally. There is an increased lateral right base pneumothorax. Given this change, close clinical and imaging surveillance remain advised. There is bibasilar consolidation with increase in volume loss in the right lower lobe compared to earlier in the day. No change in cardiac silhouette. Extensive subcutaneous air remains as does probable pneumomediastinum in the neck region.  Comment:  Dr. Elsworth Soho aware of these findings.   Electronically  Signed   By: Lowella Grip III M.D.   On: 03/13/2015 09:28   Dg Chest Port 1 View  03/13/2015   ADDENDUM REPORT: 03/13/2015 07:39  ADDENDUM: Critical Value/emergent results were called by telephone at the time of interpretation on 03/13/2015 at 7:39 am to Dr. Kara Mead , who verbally acknowledged these results.   Electronically Signed   By: Lowella Grip III M.D.   On: 03/13/2015 07:39   03/13/2015   CLINICAL DATA:  Hypoxia/respiratory failure  EXAM: PORTABLE CHEST - 1 VIEW  COMPARISON:  March 12, 2015  FINDINGS: Endotracheal tube tip is 3.7 cm above the carina. Central catheter tip is in the superior vena cava near the cavoatrial junction. Nasogastric tube tip and side port are below the diaphragm. There is a small apical pneumothorax on each side. There is extensive subcutaneous emphysema on the right. There appears to be some pneumomediastinum as well. There is airspace consolidation in the left lower lobe with small left effusion. There is mixed interstitial and patchy alveolar opacity in the right lower lobe region. Heart is enlarged but stable. Pulmonary vascular is normal. There is postoperative change in the left perihilar region.  IMPRESSION: Small pneumothoraces bilaterally. Suspect a degree of pneumomediastinum. There is subcutaneous air throughout the right hemithorax. There is  airspace consolidation with small effusion on the left. There is a mix of interstitial and patchy airspace consolidation in the right lower lobe, stable. Stable cardiomegaly.  Electronically Signed: By: Lowella Grip III M.D. On: 03/13/2015 07:31   Dg Chest Port 1 View  03/12/2015   CLINICAL DATA:  Central line placement.  Nasogastric tube placement.  EXAM: PORTABLE CHEST - 1 VIEW  COMPARISON:  03/12/2015 at 1901 hours.  FINDINGS: Support apparatus: Endotracheal tube remains present with the tip 38 mm from the carina. Enteric tube is present with the tip not visible. Proximal side port is in the region of the cardia of the stomach. New LEFT IJ central line is present with the tip at the junction of the superior vena cava and RIGHT atrium.  Cardiomediastinal Silhouette: Enlarged but unchanged. Partially obscured by airspace disease.  Lungs: Unchanged airspace disease compared to the exam earlier this evening. This is perihilar and basilar predominant and probably represents pulmonary edema No pneumothorax.  Effusions:  Small LEFT  Other:  Monitoring leads project over the chest.  IMPRESSION: 1. Uncomplicated interval placement of enteric tube and LEFT IJ central line. 2. Similar cardiomegaly and bilateral basilar predominant airspace disease most consistent with pulmonary edema.   Electronically Signed   By: Dereck Ligas M.D.   On: 03/12/2015 20:30   Portable Chest Xray  03/12/2015   CLINICAL DATA:  Endotracheal tube placement.  EXAM: PORTABLE CHEST - 1 VIEW  COMPARISON:  Earlier film, same date.  FINDINGS: The endotracheal tube is 3.3 cm above the carina. The heart is enlarged and there is a persistent bilateral asymmetric airspace process perhaps slightly and proved after intubation. Suspect a left-sided pleural effusion.  IMPRESSION: Endotracheal tube is 3.3 cm above the carina.  Persistent bilateral airspace process, slightly improved after intubation.   Electronically Signed   By: Marijo Sanes M.D.    On: 03/12/2015 19:18   Dg Abd Portable 1v  03/12/2015   CLINICAL DATA:  NG tube placement.  EXAM: PORTABLE ABDOMEN - 1 VIEW  COMPARISON:  None.  FINDINGS: The NG tube tip is in the upper body region of the stomach. The proximal port is just below the GE junction.  Bilateral airspace process persists.  IMPRESSION: The NG tube is in the stomach.   Electronically Signed   By: Marijo Sanes M.D.   On: 03/12/2015 20:21   Medications: . cisatracurium (NIMBEX) infusion 3 mcg/kg/min (03/14/15 0530)  . fentaNYL infusion INTRAVENOUS 150 mcg/hr (03/14/15 0530)  . heparin 10,000 units/ 20 mL infusion syringe    . heparin    . midazolam (VERSED) infusion 1.5 mg/hr (03/13/15 1900)  . norepinephrine (LEVOPHED) Adult infusion 30 mcg/min (03/14/15 0651)  . dialysis replacement fluid (prismasate) 400 mL/hr at 03/14/15 0422  . dialysis replacement fluid (prismasate) 200 mL/hr at 03/13/15 1530  . dialysate (PRISMASATE) 1,000 mL/hr at 03/14/15 0207  .  sodium bicarbonate  infusion 1000 mL 75 mL/hr at 03/13/15 1730  . vasopressin (PITRESSIN) infusion - *FOR SHOCK* 0.03 Units/min (03/13/15 2045)   . antiseptic oral rinse  7 mL Mouth Rinse q12n4p  . antiseptic oral rinse  7 mL Mouth Rinse QID  . artificial tears  1 application Both Eyes 3 times per day  . aspirin  81 mg Per Tube Daily  . chlorhexidine  15 mL Mouth Rinse BID  . docusate  100 mg Oral BID  . feeding supplement (VITAL HIGH PROTEIN)  1,000 mL Per Tube Q24H  . insulin aspart  0-20 Units Subcutaneous 6 times per day  . insulin glargine  10 Units Subcutaneous Daily  . meropenem (MERREM) IV  1 g Intravenous Q12H  . methylPREDNISolone (SOLU-MEDROL) injection  125 mg Intravenous 3 times per day  . pantoprazole (PROTONIX) IV  40 mg Intravenous Daily  . sodium chloride  3 mL Intravenous Q12H  . vancomycin  750 mg Intravenous Q24H   BACKGROUND  BACKGROUND 69 y.o. year-old WM with a history of AFib, HTN, recent (02/24/15) LLlobectomy for SCCA, kidney  stones (Dr. Jeffie Pollock), who was readmitted to the hospital 1 week after his lobectomy (12/23) with progressive SOB, hypoxemia. He was treated for PNA and also given diuretics with progressive worsening. Contrasted chest CT (3/27) showed diffuse ground glass opacities. Amio was stopped (re ? of amio lung), steroids added. He subsequently worsened requiring intubation 3/31, and because of high PEEP developed bilateral pneumothoraces requiring chest tubes today (4/1) . He developed progressive hypotension requiring pressors  despite fluid boluses, bicarb drip for acidosis, hyperkalemia, and  anuric. CRRT initiated 03/13/15.    1. Anuric AKI in the setting of VDRF/shock/ARDS - CRRT initiated 4/1. Probs with filter clotting (added heparin) and catheter (currently getting TPA) but even with interruptions still adequate control of K, phos, acidbase. CVP 9. Keeping volume even. No change in prescription 2.  ARDS - in setting recent lllobectomy, HCAP, possible amio lung, barotrauma, bilateral PTX/chest tubes - per CCM. Steroids/ATB's/chest tubes/vent support 3. Septic shock - ATB's and pressors per CCM. EF 25% 4. Shock liver  5. HCAP  - atbs                     Jamal Maes, MD [facility] [phone] pager 03/14/2015, 9:04 AM

## 2015-03-14 NOTE — Progress Notes (Signed)
Name: Russell [name] MRN: [id number] DOB: 1946-10-10    ADMISSION DATE:  03/02/2015 CONSULTATION DATE:  3/28  REFERRING MD :  Roxan Hockey   CHIEF COMPLAINT:  Pulmonary infiltrates, hypoxia   BRIEF PATIENT DESCRIPTION: 69yo male with hx Afib, HTN, newly dx non-small cell lung ca, s/p recent LLLobectomy 3/15, pos LNs.  He was d/c post op and returned 3/23 to the office with progressive SOB & BL infx.  In office his sats were in 70's on RA and he was tx to [facility].  He was admitted by CVTS with ?HCAP v pulm edema.  He was treated with IV abx and lasix but remained SOB and CT revealed diffuse ground glass opacities, amiodarone was stopped and PCCM consulted for further recs.   SIGNIFICANT EVENTS  CTA chest 3/27>>> NEG PE, Interval development of diffuse bilateral predominately ground-glass and associated consolidative pulmonary opacities which may be secondary to multi focal infection or potentially an inflammatory process such as drug reaction. Hemorrhage could have a similar appearance. 3/29 tx to ICU increased FIO2 needs 3/31 intubated-ARDS protocol 4/1 BL pnthx- BL chest tubes  4/1 CRRT   STUDIES:  2D echo 3/25>>>EF 50-55%, trivial pericardiac effusion, small L pleural effusion, trivial MR, mild Pulm regurg  SUBJECTIVE: ARDS protocol, deeply sedated & paralysed - on pressors Poor UO, on CRRT now   VITAL SIGNS: Temp:  [96.5 F (35.8 C)-100.7 F (38.2 C)] 97.7 F (36.5 C) (04/02 0700) Pulse Rate:  [25-113] 95 (04/02 0346) Resp:  [13-35] 35 (04/02 0700) BP: (71-172)/(17-98) 109/68 mmHg (04/02 0700) SpO2:  [93 %-100 %] 100 % (04/02 0400) FiO2 (%):  [80 %-100 %] 80 % (04/02 0415) Weight:  [85.8 kg (189 lb 2.5 oz)] 85.8 kg (189 lb 2.5 oz) (04/02 0423)  PHYSICAL EXAMINATION: General:  Pleasant, chronically ill appearing male,  Neuro:sedated , paralysed , BIS 40 HEENT:  Mm dry, no JVD, neck crepitus +  Cardiovascular:  s1s2 irreg  Lungs:  diminished bases otherwise  fairly clear, L chest dressing c/d , resolved air leak on right.  Abdomen:  Soft, nt, nd, +bs  Musculoskeletal:  Warm and dry, no edema   Recent Labs Lab 03/13/15 1230 03/13/15 1717 03/14/15 0328  NA 146* 145 141  K 5.2* 4.9 4.8  CL 105 103 102  CO2 28 26 31   BUN 55* 54* 47*  CREATININE 2.91* 3.04* 2.90*  GLUCOSE 286* 280* 216*    Recent Labs Lab 03/12/15 0525 03/13/15 0428 03/14/15 0328  HGB 10.4* 10.6* 11.5*  HCT 32.4* 34.5* 37.1*  WBC 17.8* 23.8* 24.2*  PLT 428* 488* 316   Dg Chest Port 1 View  03/13/2015   CLINICAL DATA:  Central catheter placement  EXAM: PORTABLE CHEST - 1 VIEW  COMPARISON:  Study obtained earlier in the day  FINDINGS: There is a new central catheter with the tip in the superior vena cava. Endotracheal tube tip is 3.9 cm above the carina. Nasogastric tube tip and side port are in the stomach. Left central catheter tip is in the superior vena cava. There are bilateral chest tubes. The pneumothorax noted earlier in the day in the lateral right base is smaller. No new pneumothorax seen compared to earlier in the day. There is less volume loss in the right lower lobe, although there remains interstitial and patchy alveolar consolidation in this area. Somewhat greater consolidation the left lower lobe remains. Heart is enlarged but stable. Pulmonary vascularity is normal. No adenopathy. There is subcutaneous air on the  right as well as probable pneumomediastinum in the neck region, stable.  IMPRESSION: New central catheter tip in superior vena cava. Right lateral basilar pneumothorax is smaller compared to earlier in the day. The subcutaneous air in apparent pneumomediastinum remain. Less volume loss right base compared to earlier in the day. Areas of interstitial and alveolar consolidation in the lower lobes, more on the left than on the right, remain stable. Cardiomegaly is stable.   Electronically Signed   By: Lowella Grip III M.D.   On: 03/13/2015 13:20   Dg  Chest Port 1 View  03/13/2015   CLINICAL DATA:  Bilateral chest tube placement.  EXAM: PORTABLE CHEST - 1 VIEW  COMPARISON:  Study obtained earlier in the day  FINDINGS: There are bilateral chest tubes apparent. There is a pneumothorax at the lateral right base, increased from prior study. No pneumothorax is currently seen on the left. No tension component. There is extensive subcutaneous air on the right. There appears to be some pneumomediastinum in the neck region as well.  There is volume loss in the right lower lobe with interstitial and consolidation in this area. There is persistent consolidation in the left lower lobe with left effusion.  Central catheter tip is in the superior vena cava. Endotracheal tube tip is 3.3 cm above the carina.  IMPRESSION: Chest tubes have been placed bilaterally. There is an increased lateral right base pneumothorax. Given this change, close clinical and imaging surveillance remain advised. There is bibasilar consolidation with increase in volume loss in the right lower lobe compared to earlier in the day. No change in cardiac silhouette. Extensive subcutaneous air remains as does probable pneumomediastinum in the neck region.  Comment:  Dr. Elsworth Soho aware of these findings.   Electronically Signed   By: Lowella Grip III M.D.   On: 03/13/2015 09:28   Dg Chest Port 1 View  03/13/2015   ADDENDUM REPORT: 03/13/2015 07:39  ADDENDUM: Critical Value/emergent results were called by telephone at the time of interpretation on 03/13/2015 at 7:39 am to Dr. Kara Mead , who verbally acknowledged these results.   Electronically Signed   By: Lowella Grip III M.D.   On: 03/13/2015 07:39   03/13/2015   CLINICAL DATA:  Hypoxia/respiratory failure  EXAM: PORTABLE CHEST - 1 VIEW  COMPARISON:  March 12, 2015  FINDINGS: Endotracheal tube tip is 3.7 cm above the carina. Central catheter tip is in the superior vena cava near the cavoatrial junction. Nasogastric tube tip and side port are below  the diaphragm. There is a small apical pneumothorax on each side. There is extensive subcutaneous emphysema on the right. There appears to be some pneumomediastinum as well. There is airspace consolidation in the left lower lobe with small left effusion. There is mixed interstitial and patchy alveolar opacity in the right lower lobe region. Heart is enlarged but stable. Pulmonary vascular is normal. There is postoperative change in the left perihilar region.  IMPRESSION: Small pneumothoraces bilaterally. Suspect a degree of pneumomediastinum. There is subcutaneous air throughout the right hemithorax. There is airspace consolidation with small effusion on the left. There is a mix of interstitial and patchy airspace consolidation in the right lower lobe, stable. Stable cardiomegaly.  Electronically Signed: By: Lowella Grip III M.D. On: 03/13/2015 07:31   Dg Chest Port 1 View  03/12/2015   CLINICAL DATA:  Central line placement.  Nasogastric tube placement.  EXAM: PORTABLE CHEST - 1 VIEW  COMPARISON:  03/12/2015 at 1901 hours.  FINDINGS: Support  apparatus: Endotracheal tube remains present with the tip 38 mm from the carina. Enteric tube is present with the tip not visible. Proximal side port is in the region of the cardia of the stomach. New LEFT IJ central line is present with the tip at the junction of the superior vena cava and RIGHT atrium.  Cardiomediastinal Silhouette: Enlarged but unchanged. Partially obscured by airspace disease.  Lungs: Unchanged airspace disease compared to the exam earlier this evening. This is perihilar and basilar predominant and probably represents pulmonary edema No pneumothorax.  Effusions:  Small LEFT  Other:  Monitoring leads project over the chest.  IMPRESSION: 1. Uncomplicated interval placement of enteric tube and LEFT IJ central line. 2. Similar cardiomegaly and bilateral basilar predominant airspace disease most consistent with pulmonary edema.   Electronically Signed    By: Dereck Ligas M.D.   On: 03/12/2015 20:30   Portable Chest Xray  03/12/2015   CLINICAL DATA:  Endotracheal tube placement.  EXAM: PORTABLE CHEST - 1 VIEW  COMPARISON:  Earlier film, same date.  FINDINGS: The endotracheal tube is 3.3 cm above the carina. The heart is enlarged and there is a persistent bilateral asymmetric airspace process perhaps slightly and proved after intubation. Suspect a left-sided pleural effusion.  IMPRESSION: Endotracheal tube is 3.3 cm above the carina.  Persistent bilateral airspace process, slightly improved after intubation.   Electronically Signed   By: Marijo Sanes M.D.   On: 03/12/2015 19:18   Dg Abd Portable 1v  03/12/2015   CLINICAL DATA:  NG tube placement.  EXAM: PORTABLE ABDOMEN - 1 VIEW  COMPARISON:  None.  FINDINGS: The NG tube tip is in the upper body region of the stomach. The proximal port is just below the GE junction. Bilateral airspace process persists.  IMPRESSION: The NG tube is in the stomach.   Electronically Signed   By: Marijo Sanes M.D.   On: 03/12/2015 20:21       ASSESSMENT / PLAN:  PULMONARY OETT 3/31 >> A: ARDS - multifactorial in setting recent LLLobectomy for NSCLCA, HCAP -treated with abx, concern for amiodarone toxicity given CT appearance. Amiodarone stopped 3/27.  Barotrauma - BL pneumothoraces & sub cut emphysema 4/1 P:   ? Amiodarone toxicity on 3/28,  solumedrol at 125 mg IV q8  ARDS protocol -keep at 8 cc /kg , drop RR to 30 to avoid autoPEEP Chest tubes per TCTS - ensure suction, resolved air leak on right & improved CXR  Ct Nimbex another 24h   CARDIOVASCULAR CVL 3/31 >> A: septic shock Paroxysmal AF Elevated trop -peak 1.9 Echo - EF 25 %, akinetic septum & inferior wall, no sig effusion P:  Levophed gtt Add vaso gtt Hold pradaxa -   heparin IV  Cards input, ASA daily   RENAL A:  AKI  Hyperkalemia Acute resp acidosis  P:   Bicarb gtt ct CRRT - per Renal   GASTROINTESTINAL A:  Shock liver Protein  cal malnutrition P:   Npo for now Start TFs @ 20/h IV PPI  HEMATOLOGIC A:  No issues P:  SQ heparin  INFECTIOUS A:  HCAP P:    Sputum 4/1 >> Abx: zosyn /vanc 3/23 >> 4/1 Cefepime 4/1 >>  ENDOCRINE A:  Hyperglycemia , related to stress, steroids  P:   SSI Add lantus 10  NEUROLOGIC A:  Acute encephalopathy P:   RASS goal: -5 while paralysed Versed/fent gtt   FAMILY  - Updates: wife in detail 4/1  - Inter-disciplinary family  meet or Palliative Care meeting due by:  4/7    TODAY'S SUMMARY: ARDS with BL infiltrates of presumed inflammatory etiology post lobectomy, now with barotrauma & multi-organ failure XR improved vs PEEP effect Concern for new LV dysfunction    The patient is critically ill with multiple organ systems failure and requires high complexity decision making for assessment and support, frequent evaluation and titration of therapies, application of advanced monitoring technologies and extensive interpretation of multiple databases. Critical Care Time devoted to patient care services described in this note independent of APP time is 45 minutes.   Kara Mead MD. Shade Flood. [facility] Pager [phone] If no response call Fowler MD  03/14/2015, 7:53 AM

## 2015-03-14 NOTE — Progress Notes (Addendum)
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 Please refer to Dr. Alan Ripper note on 03/06/2015 for consultation earlier during this hospitalization. He developed postoperative atrial fibrillation and required initiation of amiodarone. He was seen by Dr. Caryl Comes and not felt to have amiodarone lung toxicity. He was getting antibiotics and steroids for questionable amiodarone lung toxicity, per pulmonary. He ultimately developed hypoxic respiratory failure and probable sepsis. He was intubated and is currently ventilated. He was noted to be calm progressively hypotensive. A repeat echocardiogram was ordered as well as troponins. Initial troponin was elevated 1.97 and repeat was 0.8. (2 days prior the troponin was negative at 0.03). Echocardiogram performed on 03/13/2015 demonstrates an EF of 25-30% (newly decreased from 55-60%) with akinesis of the mid anteroseptal myocardium and apical inferior myocardium. Small pericardial effusion was noted.  Objective:  Temp:  [96.5 F (35.8 C)-100.7 F (38.2 C)] 97.7 F (36.5 C) (04/02 0700) Pulse Rate:  [25-113] 111 (04/02 1117) Resp:  [19-35] 30 (04/02 1117) BP: (79-172)/(17-98) 86/49 mmHg (04/02 1117) SpO2:  [96 %-100 %] 98 % (04/02 1117) FiO2 (%):  [80 %-100 %] 80 % (04/02 1117) Weight:  [189 lb 2.5 oz (85.8 kg)] 189 lb 2.5 oz (85.8 kg) (04/02 0423) Weight change: 3 lb 1.4 oz (1.4 kg)  Intake/Output from previous day: 04/01 0701 - 04/02 0700 In: 4509.1 [I.V.:3939.1; NG/GT:30; IV Piggyback:210] Out: 3058 [Urine:35; Chest Tube:920]  Intake/Output from this shift: Total I/O In: -  Out: 394 [Other:394]  Medications: Current Facility-Administered Medications  Medication Dose Route Frequency Provider Last Rate Last Dose  . 0.9 %  sodium chloride infusion  250 mL Intravenous PRN Wayne E Gold, PA-C      . acetaminophen (TYLENOL) tablet 650 mg  650 mg Oral Q6H PRN John Giovanni, PA-C   650 mg  at 03/05/15 9323   Or  . acetaminophen (TYLENOL) suppository 650 mg  650 mg Rectal Q6H PRN Wayne E Gold, PA-C      . antiseptic oral rinse (CPC / CETYLPYRIDINIUM CHLORIDE 0.05%) solution 7 mL  7 mL Mouth Rinse q12n4p Melrose Nakayama, MD   7 mL at 03/13/15 1600  . antiseptic oral rinse (CPC / CETYLPYRIDINIUM CHLORIDE 0.05%) solution 7 mL  7 mL Mouth Rinse QID Rahul P Desai, PA-C   7 mL at 03/14/15 1200  . artificial tears (LACRILUBE) ophthalmic ointment 1 application  1 application Both Eyes 3 times per day Rigoberto Noel, MD   1 application at 55/73/22 0515  . aspirin chewable tablet 81 mg  81 mg Per Tube Daily Kara Mead V, MD   81 mg at 03/14/15 1027  . bisacodyl (DULCOLAX) EC tablet 5 mg  5 mg Oral Daily PRN Wayne E Gold, PA-C      . chlorhexidine (PERIDEX) 0.12 % solution 15 mL  15 mL Mouth Rinse BID Rahul P Desai, PA-C   15 mL at 03/14/15 0800  . cisatracurium (NIMBEX) 200 mg in sodium chloride 0.9 % 200 mL (1 mg/mL) infusion  3-10 mcg/kg/min Intravenous Titrated Kara Mead V, MD 14.6 mL/hr at 03/14/15 0530 3 mcg/kg/min at 03/14/15 0530  . docusate (COLACE) 50 MG/5ML liquid 100 mg  100 mg Oral BID Lauren D Bajbus, RPH   100 mg at 03/14/15 0950  . feeding supplement (PRO-STAT SUGAR FREE 64) liquid 30 mL  30 mL Per Tube BID Kallie Locks, RD   30 mL at 03/14/15 1027  . [  START ON 03/15/2015] feeding supplement (PRO-STAT SUGAR FREE 64) liquid 30 mL  30 mL Per Tube Daily Kallie Locks, RD      . feeding supplement (VITAL HIGH PROTEIN) liquid 1,000 mL  1,000 mL Per Tube Continuous Kallie Locks, RD 25 mL/hr at 03/14/15 1205 1,000 mL at 03/14/15 1205  . fentaNYL (SUBLIMAZE) 2,500 mcg in sodium chloride 0.9 % 250 mL (10 mcg/mL) infusion  25-300 mcg/hr Intravenous Continuous Kara Mead V, MD 15 mL/hr at 03/14/15 0530 150 mcg/hr at 03/14/15 0530  . fentaNYL (SUBLIMAZE) bolus via infusion 50 mcg  50 mcg Intravenous Q30 min PRN Kara Mead V, MD      . heparin 10,000 units/ 20 mL infusion syringe   250-3,000 Units/hr CRRT Continuous Corliss Parish, MD      . heparin ADULT infusion 100 units/mL (25000 units/250 mL)  1,000 Units/hr Intravenous Continuous Durwin Nora, RPH 10 mL/hr at 03/14/15 1004 1,000 Units/hr at 03/14/15 1004  . heparin bolus via infusion syringe 1,000 Units  1,000 Units CRRT PRN Corliss Parish, MD      . heparin injection 1,000-6,000 Units  1,000-6,000 Units CRRT PRN Corliss Parish, MD      . insulin aspart (novoLOG) injection 0-20 Units  0-20 Units Subcutaneous 6 times per day Rigoberto Noel, MD   4 Units at 03/14/15 1152  . insulin glargine (LANTUS) injection 10 Units  10 Units Subcutaneous Daily Rigoberto Noel, MD   10 Units at 03/14/15 [phone]  . meropenem (MERREM) 1 g in sodium chloride 0.9 % 100 mL IVPB  1 g Intravenous Q12H Priscella Mann, RPH   1 g at 03/14/15 0412  . methylPREDNISolone sodium succinate (SOLU-MEDROL) 125 mg/2 mL injection 125 mg  125 mg Intravenous 3 times per day Rigoberto Noel, MD   125 mg at 03/14/15 0519  . midazolam (VERSED) 50 mg in sodium chloride 0.9 % 50 mL (1 mg/mL) infusion  1-10 mg/hr Intravenous Continuous Kara Mead V, MD 1.5 mL/hr at 03/13/15 1900 1.5 mg/hr at 03/13/15 1900  . midazolam (VERSED) bolus via infusion 2 mg  2 mg Intravenous Q30 min PRN Rigoberto Noel, MD      . nitroGLYCERIN (NITROSTAT) SL tablet 0.4 mg  0.4 mg Sublingual Q5 min PRN Dayna N Dunn, PA-C      . norepinephrine (LEVOPHED) 16 mg in dextrose 5 % 250 mL (0.064 mg/mL) infusion  2-50 mcg/min Intravenous Continuous Melrose Nakayama, MD 28.1 mL/hr at 03/14/15 0952 29.973 mcg/min at 03/14/15 0952  . pantoprazole (PROTONIX) injection 40 mg  40 mg Intravenous Daily Rahul P Desai, PA-C   40 mg at 03/14/15 0950  . prismasol BGK 4/2.5 5,000 mL dialysis replacement fluid   CRRT Continuous Jamal Maes, MD 400 mL/hr at 03/14/15 0422    . prismasol BGK 4/2.5 5,000 mL dialysis replacement fluid   CRRT Continuous Jamal Maes, MD 200 mL/hr at 03/13/15 1530      . prismasol BGK 4/2.5 5,000 mL dialysis solution   CRRT Continuous Jamal Maes, MD 1,000 mL/hr at 03/14/15 1153    . senna-docusate (Senokot-S) tablet 1 tablet  1 tablet Oral QHS PRN Wayne E Gold, PA-C      . sodium bicarbonate 150 mEq in dextrose 5 % 1,000 mL infusion   Intravenous Continuous Rigoberto Noel, MD 75 mL/hr at 03/14/15 0953    . sodium chloride 0.9 % injection 3 mL  3 mL Intravenous Q12H Wayne E Gold, PA-C   3 mL at 03/13/15  0945  . sodium chloride 0.9 % injection 3 mL  3 mL Intravenous PRN Wayne E Gold, PA-C      . sodium chloride 0.9 % primer fluid for CRRT   CRRT PRN Jamal Maes, MD      . vancomycin (VANCOCIN) IVPB 750 mg/150 ml premix  750 mg Intravenous Q24H Priscella Mann, RPH   750 mg at 03/14/15 1205  . vasopressin (PITRESSIN) 40 Units in sodium chloride 0.9 % 250 mL (0.16 Units/mL) infusion  0.03 Units/min Intravenous Continuous Anders Simmonds, MD 11.3 mL/hr at 03/13/15 2045 0.03 Units/min at 03/13/15 2045    Physical Exam: General appearance: Intubated, sedated on ventilator Neck: no carotid bruit, no JVD and Trialysis catheter in the right internal jugular vein Lungs: diminished breath sounds bilaterally Heart: irregularly irregular rhythm Abdomen: soft, non-tender; bowel sounds normal; no masses,  no organomegaly Extremities: Cool, mild edema Pulses: Decreased bilaterally Skin: Cool, pale, dry Neurologic: Mental status: Unable to assess due to sedation .  Lab Results: Results for orders placed or performed during the hospital encounter of 02/18/2015 (from the past 48 hour(s))  Vancomycin, trough     Status: None   Collection Time: 03/12/15  6:44 PM  Result Value Ref Range   Vancomycin Tr 14.1 10.0 - 20.0 ug/mL  Draw ABG 1 hour after initiation of ventilator     Status: Abnormal   Collection Time: 03/12/15  8:57 PM  Result Value Ref Range   FIO2 100.00 %   Delivery systems VENTILATOR    Mode PRESSURE REGULATED VOLUME CONTROL    VT 580 mL   Rate 20  resp/min   Peep/cpap 15.0 cm H20   pH, Arterial 7.211 (L) 7.350 - 7.450   pCO2 arterial 69.2 (HH) 35.0 - 45.0 mmHg    Comment: CRITICAL RESULT CALLED TO, READ BACK BY AND VERIFIED WITH: Kerry Fort, RN AT [phone], BY CHELSEA [place], RRT ON 03/12/15    pO2, Arterial 62.2 (L) 80.0 - 100.0 mmHg   Bicarbonate 26.7 (H) 20.0 - 24.0 mEq/L   TCO2 28.8 0 - 100 mmol/L   Acid-base deficit 0.4 0.0 - 2.0 mmol/L   O2 Saturation 81.0 %   Patient temperature 98.6    Collection site RIGHT RADIAL    Drawn by [phone]    Sample type ARTERIAL    Allens test (pass/fail) PASS PASS  I-STAT 3, arterial blood gas (G3+)     Status: Abnormal   Collection Time: 03/13/15 12:02 AM  Result Value Ref Range   pH, Arterial 7.169 (LL) 7.350 - 7.450   pCO2 arterial 75.4 (HH) 35.0 - 45.0 mmHg   pO2, Arterial 79.0 (L) 80.0 - 100.0 mmHg   Bicarbonate 27.4 (H) 20.0 - 24.0 mEq/L   TCO2 30 0 - 100 mmol/L   O2 Saturation 91.0 %   Acid-base deficit 3.0 (H) 0.0 - 2.0 mmol/L   Patient temperature 98.6 F    Collection site RADIAL, ALLEN'S TEST ACCEPTABLE    Drawn by RT    Sample type ARTERIAL    Comment NOTIFIED PHYSICIAN   Blood gas, arterial     Status: Abnormal   Collection Time: 03/13/15  1:55 AM  Result Value Ref Range   FIO2 100.00 %   Delivery systems VENTILATOR    Mode PRESSURE REGULATED VOLUME CONTROL    VT 460 mL   Rate 28 resp/min   Peep/cpap 15.0 cm H20   pH, Arterial 7.193 (LL) 7.350 - 7.450    Comment: CRITICAL RESULT CALLED  TO, READ BACK BY AND VERIFIED WITH: Kerry Fort, RN AT [place], [place], RRT ON 03/13/15    pCO2 arterial 67.3 (HH) 35.0 - 45.0 mmHg    Comment: CRITICAL RESULT CALLED TO, READ BACK BY AND VERIFIED WITH: Kerry Fort, RN AT [place], [place], RRT ON 03/13/15    pO2, Arterial 85.5 80.0 - 100.0 mmHg   Bicarbonate 24.9 (H) 20.0 - 24.0 mEq/L   TCO2 26.9 0 - 100 mmol/L   Acid-base deficit 2.3 (H) 0.0 - 2.0 mmol/L   O2 Saturation 92.1 %   Patient temperature 98.6     Collection site ARTERIAL LINE    Drawn by [phone]    Sample type ARTERIAL    Allens test (pass/fail) PASS PASS  I-STAT 3, arterial blood gas (G3+)     Status: Abnormal   Collection Time: 03/13/15  3:37 AM  Result Value Ref Range   pH, Arterial 7.204 (L) 7.350 - 7.450   pCO2 arterial 49.4 (H) 35.0 - 45.0 mmHg   pO2, Arterial 69.0 (L) 80.0 - 100.0 mmHg   Bicarbonate 19.5 (L) 20.0 - 24.0 mEq/L   TCO2 21 0 - 100 mmol/L   O2 Saturation 89.0 %   Acid-base deficit 8.0 (H) 0.0 - 2.0 mmol/L   Patient temperature 98.6 F    Collection site RADIAL, ALLEN'S TEST ACCEPTABLE    Drawn by RT    Sample type ARTERIAL   Basic metabolic panel     Status: Abnormal   Collection Time: 03/13/15  4:28 AM  Result Value Ref Range   Sodium 143 135 - 145 mmol/L   Potassium 5.9 (H) 3.5 - 5.1 mmol/L    Comment: RESULT CALLED TO, READ BACK BY AND VERIFIED WITH: ATamala Julian RN [phone] GREEN R    Chloride 109 96 - 112 mmol/L   CO2 26 19 - 32 mmol/L   Glucose, Bld 234 (H) 70 - 99 mg/dL   BUN 44 (H) 6 - 23 mg/dL    Comment: DELTA CHECK NOTED   Creatinine, Ser 2.12 (H) 0.50 - 1.35 mg/dL    Comment: RESULT CALLED TO, READ BACK BY AND VERIFIED WITH: ATamala Julian RN [phone] GREEN R    Calcium 7.3 (L) 8.4 - 10.5 mg/dL   GFR calc non Af Amer 30 (L) >90 mL/min   GFR calc Af Amer 35 (L) >90 mL/min    Comment: (NOTE) The eGFR has been calculated using the CKD EPI equation. This calculation has not been validated in all clinical situations. eGFR's persistently <90 mL/min signify possible Chronic Kidney Disease.    Anion gap 8 5 - 15  CBC     Status: Abnormal   Collection Time: 03/13/15  4:28 AM  Result Value Ref Range   WBC 23.8 (H) 4.0 - 10.5 K/uL   RBC 3.74 (L) 4.22 - 5.81 MIL/uL   Hemoglobin 10.6 (L) 13.0 - 17.0 g/dL   HCT 34.5 (L) 39.0 - 52.0 %   MCV 92.2 78.0 - 100.0 fL   MCH 28.3 26.0 - 34.0 pg   MCHC 30.7 30.0 - 36.0 g/dL   RDW 14.0 11.5 - 15.5 %   Platelets 488 (H) 150 - 400 K/uL  Glucose, capillary      Status: Abnormal   Collection Time: 03/13/15  9:00 AM  Result Value Ref Range   Glucose-Capillary 280 (H) 70 - 99 mg/dL  Glucose, capillary     Status: Abnormal   Collection Time: 03/13/15 12:24 PM  Result Value Ref Range   Glucose-Capillary 243 (H) 70 - 99 mg/dL  Basic metabolic panel     Status: Abnormal   Collection Time: 03/13/15 12:30 PM  Result Value Ref Range   Sodium 146 (H) 135 - 145 mmol/L   Potassium 5.2 (H) 3.5 - 5.1 mmol/L   Chloride 105 96 - 112 mmol/L   CO2 28 19 - 32 mmol/L   Glucose, Bld 286 (H) 70 - 99 mg/dL   BUN 55 (H) 6 - 23 mg/dL   Creatinine, Ser 2.91 (H) 0.50 - 1.35 mg/dL   Calcium 7.3 (L) 8.4 - 10.5 mg/dL   GFR calc non Af Amer 20 (L) >90 mL/min   GFR calc Af Amer 24 (L) >90 mL/min    Comment: (NOTE) The eGFR has been calculated using the CKD EPI equation. This calculation has not been validated in all clinical situations. eGFR's persistently <90 mL/min signify possible Chronic Kidney Disease.    Anion gap 13 5 - 15  Procalcitonin - Baseline     Status: None   Collection Time: 03/13/15 12:30 PM  Result Value Ref Range   Procalcitonin 1.76 ng/mL    Comment:        Interpretation: PCT > 0.5 ng/mL and <= 2 ng/mL: Systemic infection (sepsis) is possible, but other conditions are known to elevate PCT as well. (NOTE)         ICU PCT Algorithm               Non ICU PCT Algorithm    ----------------------------     ------------------------------         PCT < 0.25 ng/mL                 PCT < 0.1 ng/mL     Stopping of antibiotics            Stopping of antibiotics       strongly encouraged.               strongly encouraged.    ----------------------------     ------------------------------       PCT level decrease by               PCT < 0.25 ng/mL       >= 80% from peak PCT       OR PCT 0.25 - 0.5 ng/mL          Stopping of antibiotics                                             encouraged.     Stopping of antibiotics           encouraged.     ----------------------------     ------------------------------       PCT level decrease by              PCT >= 0.25 ng/mL       < 80% from peak PCT        AND PCT >= 0.5 ng/mL             Continuing antibiotics  encouraged.       Continuing antibiotics            encouraged.    ----------------------------     ------------------------------     PCT level increase compared          PCT > 0.5 ng/mL         with peak PCT AND          PCT >= 0.5 ng/mL             Escalation of antibiotics                                          strongly encouraged.      Escalation of antibiotics        strongly encouraged.   Troponin I     Status: Abnormal   Collection Time: 03/13/15 12:30 PM  Result Value Ref Range   Troponin I 1.97 (HH) <0.031 ng/mL    Comment:        POSSIBLE MYOCARDIAL ISCHEMIA. SERIAL TESTING RECOMMENDED. REPEATED TO VERIFY CRITICAL RESULT CALLED TO, READ BACK BY AND VERIFIED WITH: PATEL,P RN @ [phone] 03/13/15 LEONARD,A   I-STAT 3, arterial blood gas (G3+)     Status: Abnormal   Collection Time: 03/13/15  1:19 PM  Result Value Ref Range   pH, Arterial 7.312 (L) 7.350 - 7.450   pCO2 arterial 49.8 (H) 35.0 - 45.0 mmHg   pO2, Arterial 99.0 80.0 - 100.0 mmHg   Bicarbonate 25.0 (H) 20.0 - 24.0 mEq/L   TCO2 26 0 - 100 mmol/L   O2 Saturation 97.0 %   Acid-base deficit 1.0 0.0 - 2.0 mmol/L   Patient temperature 100.3 F    Collection site ARTERIAL LINE    Drawn by RT    Sample type ARTERIAL   Body fluid culture     Status: None (Preliminary result)   Collection Time: 03/13/15  3:41 PM  Result Value Ref Range   Specimen Description FLUID LEFT    Special Requests NONE    Gram Stain      FEW WBC PRESENT, PREDOMINANTLY MONONUCLEAR NO ORGANISMS SEEN Performed at [facility]    Culture NO GROWTH Performed at [facility]     Report Status PENDING   Renal function panel (daily at 1600)     Status: Abnormal    Collection Time: 03/13/15  5:17 PM  Result Value Ref Range   Sodium 145 135 - 145 mmol/L   Potassium 4.9 3.5 - 5.1 mmol/L   Chloride 103 96 - 112 mmol/L   CO2 26 19 - 32 mmol/L   Glucose, Bld 280 (H) 70 - 99 mg/dL   BUN 54 (H) 6 - 23 mg/dL   Creatinine, Ser 3.04 (H) 0.50 - 1.35 mg/dL   Calcium 7.2 (L) 8.4 - 10.5 mg/dL   Phosphorus 7.1 (H) 2.3 - 4.6 mg/dL   Albumin 1.9 (L) 3.5 - 5.2 g/dL   GFR calc non Af Amer 19 (L) >90 mL/min   GFR calc Af Amer 22 (L) >90 mL/min    Comment: (NOTE) The eGFR has been calculated using the CKD EPI equation. This calculation has not been validated in all clinical situations. eGFR's persistently <90 mL/min signify possible Chronic Kidney Disease.    Anion gap 16 (H) 5 - 15  Glucose, capillary     Status: Abnormal  Collection Time: 03/13/15  5:23 PM  Result Value Ref Range   Glucose-Capillary 270 (H) 70 - 99 mg/dL  Glucose, capillary     Status: Abnormal   Collection Time: 03/13/15  8:02 PM  Result Value Ref Range   Glucose-Capillary 235 (H) 70 - 99 mg/dL  Glucose, capillary     Status: Abnormal   Collection Time: 03/13/15 11:11 PM  Result Value Ref Range   Glucose-Capillary 211 (H) 70 - 99 mg/dL  CBC     Status: Abnormal   Collection Time: 03/14/15  3:28 AM  Result Value Ref Range   WBC 24.2 (H) 4.0 - 10.5 K/uL   RBC 4.12 (L) 4.22 - 5.81 MIL/uL   Hemoglobin 11.5 (L) 13.0 - 17.0 g/dL   HCT 37.1 (L) 39.0 - 52.0 %   MCV 90.0 78.0 - 100.0 fL   MCH 27.9 26.0 - 34.0 pg   MCHC 31.0 30.0 - 36.0 g/dL   RDW 14.0 11.5 - 15.5 %   Platelets 316 150 - 400 K/uL    Comment: REPEATED TO VERIFY  Comprehensive metabolic panel     Status: Abnormal   Collection Time: 03/14/15  3:28 AM  Result Value Ref Range   Sodium 141 135 - 145 mmol/L   Potassium 4.8 3.5 - 5.1 mmol/L   Chloride 102 96 - 112 mmol/L   CO2 31 19 - 32 mmol/L   Glucose, Bld 216 (H) 70 - 99 mg/dL   BUN 47 (H) 6 - 23 mg/dL   Creatinine, Ser 2.90 (H) 0.50 - 1.35 mg/dL   Calcium 7.3 (L) 8.4  - 10.5 mg/dL   Total Protein 5.6 (L) 6.0 - 8.3 g/dL   Albumin 1.9 (L) 3.5 - 5.2 g/dL   AST 1868 (H) 0 - 37 U/L   ALT 3284 (H) 0 - 53 U/L    Comment: RESULTS CONFIRMED BY MANUAL DILUTION   Alkaline Phosphatase 118 (H) 39 - 117 U/L   Total Bilirubin 0.8 0.3 - 1.2 mg/dL   GFR calc non Af Amer 20 (L) >90 mL/min   GFR calc Af Amer 24 (L) >90 mL/min    Comment: (NOTE) The eGFR has been calculated using the CKD EPI equation. This calculation has not been validated in all clinical situations. eGFR's persistently <90 mL/min signify possible Chronic Kidney Disease.    Anion gap 8 5 - 15  Procalcitonin     Status: None   Collection Time: 03/14/15  3:28 AM  Result Value Ref Range   Procalcitonin 1.67 ng/mL    Comment:        Interpretation: PCT > 0.5 ng/mL and <= 2 ng/mL: Systemic infection (sepsis) is possible, but other conditions are known to elevate PCT as well. (NOTE)         ICU PCT Algorithm               Non ICU PCT Algorithm    ----------------------------     ------------------------------         PCT < 0.25 ng/mL                 PCT < 0.1 ng/mL     Stopping of antibiotics            Stopping of antibiotics       strongly encouraged.               strongly encouraged.    ----------------------------     ------------------------------  PCT level decrease by               PCT < 0.25 ng/mL       >= 80% from peak PCT       OR PCT 0.25 - 0.5 ng/mL          Stopping of antibiotics                                             encouraged.     Stopping of antibiotics           encouraged.    ----------------------------     ------------------------------       PCT level decrease by              PCT >= 0.25 ng/mL       < 80% from peak PCT        AND PCT >= 0.5 ng/mL             Continuing antibiotics                                              encouraged.       Continuing antibiotics            encouraged.    ----------------------------     ------------------------------      PCT level increase compared          PCT > 0.5 ng/mL         with peak PCT AND          PCT >= 0.5 ng/mL             Escalation of antibiotics                                          strongly encouraged.      Escalation of antibiotics        strongly encouraged.   Magnesium     Status: None   Collection Time: 03/14/15  3:28 AM  Result Value Ref Range   Magnesium 2.4 1.5 - 2.5 mg/dL  Troponin I     Status: Abnormal   Collection Time: 03/14/15  3:28 AM  Result Value Ref Range   Troponin I 0.80 (HH) <0.031 ng/mL    Comment:        POSSIBLE MYOCARDIAL ISCHEMIA. SERIAL TESTING RECOMMENDED.   Glucose, capillary     Status: Abnormal   Collection Time: 03/14/15  3:37 AM  Result Value Ref Range   Glucose-Capillary 192 (H) 70 - 99 mg/dL  I-STAT 3, arterial blood gas (G3+)     Status: Abnormal   Collection Time: 03/14/15  4:01 AM  Result Value Ref Range   pH, Arterial 7.303 (L) 7.350 - 7.450   pCO2 arterial 57.4 (HH) 35.0 - 45.0 mmHg   pO2, Arterial 119.0 (H) 80.0 - 100.0 mmHg   Bicarbonate 28.5 (H) 20.0 - 24.0 mEq/L   TCO2 30 0 - 100 mmol/L   O2 Saturation 98.0 %   Acid-Base Excess 1.0 0.0 - 2.0 mmol/L   Patient temperature 98.1 F    Collection site ARTERIAL LINE  Drawn by Operator    Sample type ARTERIAL    Comment NOTIFIED PHYSICIAN   Protime-INR     Status: Abnormal   Collection Time: 03/14/15  8:30 AM  Result Value Ref Range   Prothrombin Time 25.0 (H) 11.6 - 15.2 seconds   INR 2.24 (H) 0.00 - 1.49  APTT     Status: None   Collection Time: 03/14/15  8:30 AM  Result Value Ref Range   aPTT 35 24 - 37 seconds  Glucose, capillary     Status: Abnormal   Collection Time: 03/14/15  8:43 AM  Result Value Ref Range   Glucose-Capillary 194 (H) 70 - 99 mg/dL  Phosphorus     Status: Abnormal   Collection Time: 03/14/15 10:00 AM  Result Value Ref Range   Phosphorus 6.8 (H) 2.3 - 4.6 mg/dL  I-STAT 3, arterial blood gas (G3+)     Status: Abnormal   Collection Time: 03/14/15  11:15 AM  Result Value Ref Range   pH, Arterial 7.288 (L) 7.350 - 7.450   pCO2 arterial 32.0 (L) 35.0 - 45.0 mmHg   pO2, Arterial 131.0 (H) 80.0 - 100.0 mmHg   Bicarbonate 15.4 (L) 20.0 - 24.0 mEq/L   TCO2 16 0 - 100 mmol/L   O2 Saturation 99.0 %   Acid-base deficit 10.0 (H) 0.0 - 2.0 mmol/L   Patient temperature 97.7 F    Collection site RADIAL, ALLEN'S TEST ACCEPTABLE    Drawn by RT    Sample type ARTERIAL   Glucose, capillary     Status: Abnormal   Collection Time: 03/14/15 11:40 AM  Result Value Ref Range   Glucose-Capillary 183 (H) 70 - 99 mg/dL    Imaging: Dg Chest Port 1 View  03/14/2015   CLINICAL DATA:  ARDSHx of cancer, HTN, AAA, atrial fibrillation, multifocal atrial tachycardia  EXAM: PORTABLE CHEST - 1 VIEW  COMPARISON:  03/13/2015  FINDINGS: Right lateral basilar pneumothorax seen on the previous day study is not currently evident, although the difference may be due to differences in positioning.  Right subcutaneous emphysema is stable. Support apparatus including bilateral chest tubes, endotracheal tube, nasogastric tube, left internal jugular and right internal jugular central venous lines are all stable.  Dense consolidation is noted in the left lower lung zone. More hazy airspace opacity is noted in the right lower lung zone. Right lower lung zone opacity appears mildly improved. No other change.  IMPRESSION: 1. Mild improvement in airspace lung opacity at the right lung base. No new lung abnormalities. 2. Small lateral basilar pneumothorax seen on the previous day study is not evident currently. It may be improved although the difference could be positional on this semi-erect study. No other evidence of a change. 3. Stable support apparatus.   Electronically Signed   By: Lajean Manes M.D.   On: 03/14/2015 08:32   Dg Chest Port 1 View  03/13/2015   CLINICAL DATA:  Central catheter placement  EXAM: PORTABLE CHEST - 1 VIEW  COMPARISON:  Study obtained earlier in the day   FINDINGS: There is a new central catheter with the tip in the superior vena cava. Endotracheal tube tip is 3.9 cm above the carina. Nasogastric tube tip and side port are in the stomach. Left central catheter tip is in the superior vena cava. There are bilateral chest tubes. The pneumothorax noted earlier in the day in the lateral right base is smaller. No new pneumothorax seen compared to earlier in the day. There is less  volume loss in the right lower lobe, although there remains interstitial and patchy alveolar consolidation in this area. Somewhat greater consolidation the left lower lobe remains. Heart is enlarged but stable. Pulmonary vascularity is normal. No adenopathy. There is subcutaneous air on the right as well as probable pneumomediastinum in the neck region, stable.  IMPRESSION: New central catheter tip in superior vena cava. Right lateral basilar pneumothorax is smaller compared to earlier in the day. The subcutaneous air in apparent pneumomediastinum remain. Less volume loss right base compared to earlier in the day. Areas of interstitial and alveolar consolidation in the lower lobes, more on the left than on the right, remain stable. Cardiomegaly is stable.   Electronically Signed   By: Lowella Grip III M.D.   On: 03/13/2015 13:20   Dg Chest Port 1 View  03/13/2015   CLINICAL DATA:  Bilateral chest tube placement.  EXAM: PORTABLE CHEST - 1 VIEW  COMPARISON:  Study obtained earlier in the day  FINDINGS: There are bilateral chest tubes apparent. There is a pneumothorax at the lateral right base, increased from prior study. No pneumothorax is currently seen on the left. No tension component. There is extensive subcutaneous air on the right. There appears to be some pneumomediastinum in the neck region as well.  There is volume loss in the right lower lobe with interstitial and consolidation in this area. There is persistent consolidation in the left lower lobe with left effusion.  Central  catheter tip is in the superior vena cava. Endotracheal tube tip is 3.3 cm above the carina.  IMPRESSION: Chest tubes have been placed bilaterally. There is an increased lateral right base pneumothorax. Given this change, close clinical and imaging surveillance remain advised. There is bibasilar consolidation with increase in volume loss in the right lower lobe compared to earlier in the day. No change in cardiac silhouette. Extensive subcutaneous air remains as does probable pneumomediastinum in the neck region.  Comment:  Dr. Elsworth Soho aware of these findings.   Electronically Signed   By: Lowella Grip III M.D.   On: 03/13/2015 09:28   Dg Chest Port 1 View  03/13/2015   ADDENDUM REPORT: 03/13/2015 07:39  ADDENDUM: Critical Value/emergent results were called by telephone at the time of interpretation on 03/13/2015 at 7:39 am to Dr. Kara Mead , who verbally acknowledged these results.   Electronically Signed   By: Lowella Grip III M.D.   On: 03/13/2015 07:39   03/13/2015   CLINICAL DATA:  Hypoxia/respiratory failure  EXAM: PORTABLE CHEST - 1 VIEW  COMPARISON:  March 12, 2015  FINDINGS: Endotracheal tube tip is 3.7 cm above the carina. Central catheter tip is in the superior vena cava near the cavoatrial junction. Nasogastric tube tip and side port are below the diaphragm. There is a small apical pneumothorax on each side. There is extensive subcutaneous emphysema on the right. There appears to be some pneumomediastinum as well. There is airspace consolidation in the left lower lobe with small left effusion. There is mixed interstitial and patchy alveolar opacity in the right lower lobe region. Heart is enlarged but stable. Pulmonary vascular is normal. There is postoperative change in the left perihilar region.  IMPRESSION: Small pneumothoraces bilaterally. Suspect a degree of pneumomediastinum. There is subcutaneous air throughout the right hemithorax. There is airspace consolidation with small effusion on  the left. There is a mix of interstitial and patchy airspace consolidation in the right lower lobe, stable. Stable cardiomegaly.  Electronically Signed: By: Lowella Grip  III M.D. On: 03/13/2015 07:31   Dg Chest Port 1 View  03/12/2015   CLINICAL DATA:  Central line placement.  Nasogastric tube placement.  EXAM: PORTABLE CHEST - 1 VIEW  COMPARISON:  03/12/2015 at 1901 hours.  FINDINGS: Support apparatus: Endotracheal tube remains present with the tip 38 mm from the carina. Enteric tube is present with the tip not visible. Proximal side port is in the region of the cardia of the stomach. New LEFT IJ central line is present with the tip at the junction of the superior vena cava and RIGHT atrium.  Cardiomediastinal Silhouette: Enlarged but unchanged. Partially obscured by airspace disease.  Lungs: Unchanged airspace disease compared to the exam earlier this evening. This is perihilar and basilar predominant and probably represents pulmonary edema No pneumothorax.  Effusions:  Small LEFT  Other:  Monitoring leads project over the chest.  IMPRESSION: 1. Uncomplicated interval placement of enteric tube and LEFT IJ central line. 2. Similar cardiomegaly and bilateral basilar predominant airspace disease most consistent with pulmonary edema.   Electronically Signed   By: Dereck Ligas M.D.   On: 03/12/2015 20:30   Portable Chest Xray  03/12/2015   CLINICAL DATA:  Endotracheal tube placement.  EXAM: PORTABLE CHEST - 1 VIEW  COMPARISON:  Earlier film, same date.  FINDINGS: The endotracheal tube is 3.3 cm above the carina. The heart is enlarged and there is a persistent bilateral asymmetric airspace process perhaps slightly and proved after intubation. Suspect a left-sided pleural effusion.  IMPRESSION: Endotracheal tube is 3.3 cm above the carina.  Persistent bilateral airspace process, slightly improved after intubation.   Electronically Signed   By: Marijo Sanes M.D.   On: 03/12/2015 19:18   Dg Abd Portable  1v  03/12/2015   CLINICAL DATA:  NG tube placement.  EXAM: PORTABLE ABDOMEN - 1 VIEW  COMPARISON:  None.  FINDINGS: The NG tube tip is in the upper body region of the stomach. The proximal port is just below the GE junction. Bilateral airspace process persists.  IMPRESSION: The NG tube is in the stomach.   Electronically Signed   By: Marijo Sanes M.D.   On: 03/12/2015 20:21    Assessment:  Active Problems:   A-fib   Post-op pneumonia   Dyspnea   Pulmonary infiltrates   Pneumonitis   Acute respiratory failure with hypoxemia   Pleural effusion   Septic shock   ARDS (adult respiratory distress syndrome)   Bilateral pneumothorax   Cardiomyopathy   NSTEMI (non-ST elevated myocardial infarction)   Plan:  1. Newly developed cardiomyopathy with EF of 20-25%. Review of labs indicates an elevated troponin up to 1.97 on 4/1, but was previously 0.03 on 3/30. Therefore, did not suspect there is been a significant troponin elevation as would've been consistent with acute coronary syndrome, nevertheless there are segmental wall motion abnormalities with severely reduced LV function. This could be due to combination of septic shock and/or underlying coronary artery disease. I agree with aspirin and heparinization. Unfortunately given renal failure on CVVH and severe hypotension on multiple pressors, he is not a candidate for invasive catheterization at this time. He has marked elevation of AST and ALTs to 1868 and 3284. Statins would be contraindicated at this time. He is in atrial fibrillation with fairly good rate control. Blood pressure will not allow AV nodal blockers. Renal function will not allow digoxin. Amiodarone was held due to possible lung toxicity. Continue current therapy.  Cardiology will follow peripherally with you, however this  time there is little additional that we can offer.  CRITICAL CARE:  The patient is critically ill with multi-organ system failure and requires high complexity  decision making for assessment and support, frequent evaluation and titration of therapies, application of advanced monitoring technologies and extensive interpretation of multiple databases.  Time Spent Directly with Patient:  30 minutes  Length of Stay:  LOS: 10 days   Pixie Casino, MD, [facility] Attending Cardiologist [facility]  Sina Sumpter C 03/14/2015, 3:16 PM

## 2015-03-14 NOTE — Progress Notes (Signed)
Renal paged this am. CRRT filter changed for the second time tonight. During last filter change large clots noted to be in filter. CRRT restarted with new filter while waiting on renal to return call. Will continue to monitor.

## 2015-03-15 ENCOUNTER — Inpatient Hospital Stay (HOSPITAL_COMMUNITY): Payer: Medicare Other

## 2015-03-15 LAB — POCT I-STAT 3, ART BLOOD GAS (G3+)
ACID-BASE EXCESS: 6 mmol/L — AB (ref 0.0–2.0)
Acid-Base Excess: 1 mmol/L (ref 0.0–2.0)
Bicarbonate: 30.9 mEq/L — ABNORMAL HIGH (ref 20.0–24.0)
Bicarbonate: 27.3 mEq/L — ABNORMAL HIGH (ref 20.0–24.0)
O2 Saturation: 96 %
O2 Saturation: 100 %
PCO2 ART: 45.5 mmHg — AB (ref 35.0–45.0)
PO2 ART: 77 mmHg — AB (ref 80.0–100.0)
PO2 ART: 215 mmHg — AB (ref 80.0–100.0)
Patient temperature: 98
TCO2: 32 mmol/L (ref 0–100)
TCO2: 29 mmol/L (ref 0–100)
pCO2 arterial: 47.5 mmHg — ABNORMAL HIGH (ref 35.0–45.0)
pH, Arterial: 7.439 (ref 7.350–7.450)
pH, Arterial: 7.367 (ref 7.350–7.450)

## 2015-03-15 LAB — RENAL FUNCTION PANEL
ALBUMIN: 1.8 g/dL — AB (ref 3.5–5.2)
ANION GAP: 15 (ref 5–15)
Albumin: 1.9 g/dL — ABNORMAL LOW (ref 3.5–5.2)
Anion gap: 11 (ref 5–15)
BUN: 50 mg/dL — AB (ref 6–23)
BUN: 51 mg/dL — ABNORMAL HIGH (ref 6–23)
CALCIUM: 7.3 mg/dL — AB (ref 8.4–10.5)
CHLORIDE: 98 mmol/L (ref 96–112)
CO2: 26 mmol/L (ref 19–32)
CO2: 27 mmol/L (ref 19–32)
Calcium: 7.5 mg/dL — ABNORMAL LOW (ref 8.4–10.5)
Chloride: 98 mmol/L (ref 96–112)
Creatinine, Ser: 2.85 mg/dL — ABNORMAL HIGH (ref 0.50–1.35)
Creatinine, Ser: 2.78 mg/dL — ABNORMAL HIGH (ref 0.50–1.35)
GFR calc Af Amer: 24 mL/min — ABNORMAL LOW (ref >90)
GFR calc Af Amer: 25 mL/min — ABNORMAL LOW (ref >90)
GFR calc non Af Amer: 21 mL/min — ABNORMAL LOW (ref >90)
GFR, EST NON AFRICAN AMERICAN: 22 mL/min — AB (ref >90)
Glucose, Bld: 196 mg/dL — ABNORMAL HIGH (ref 70–99)
Glucose, Bld: 268 mg/dL — ABNORMAL HIGH (ref 70–99)
PHOSPHORUS: 4.2 mg/dL (ref 2.3–4.6)
POTASSIUM: 4.3 mmol/L (ref 3.5–5.1)
Phosphorus: 4.2 mg/dL (ref 2.3–4.6)
Potassium: 4.5 mmol/L (ref 3.5–5.1)
Sodium: 139 mmol/L (ref 135–145)
Sodium: 136 mmol/L (ref 135–145)

## 2015-03-15 LAB — HEPARIN LEVEL (UNFRACTIONATED)
HEPARIN UNFRACTIONATED: 0.27 [IU]/mL — AB (ref 0.30–0.70)
Heparin Unfractionated: <0.1 IU/mL — ABNORMAL LOW (ref 0.30–0.70)
Heparin Unfractionated: 0.14 IU/mL — ABNORMAL LOW (ref 0.30–0.70)

## 2015-03-15 LAB — BLOOD GAS, ARTERIAL
ACID-BASE EXCESS: 3.5 mmol/L — AB (ref 0.0–2.0)
Bicarbonate: 28.1 mEq/L — ABNORMAL HIGH (ref 20.0–24.0)
Drawn by: 22766
FIO2: 0.6 %
LHR: 30 {breaths}/min
MECHVT: 460 mL
O2 Saturation: 94.7 %
PEEP/CPAP: 8 cmH2O
Patient temperature: 98.6
TCO2: 29.5 mmol/L (ref 0–100)
pCO2 arterial: 46.6 mmHg — ABNORMAL HIGH (ref 35.0–45.0)
pH, Arterial: 7.397 (ref 7.350–7.450)
pO2, Arterial: 85.5 mmHg (ref 80.0–100.0)

## 2015-03-15 LAB — GLUCOSE, CAPILLARY
GLUCOSE-CAPILLARY: 174 mg/dL — AB (ref 70–99)
GLUCOSE-CAPILLARY: 184 mg/dL — AB (ref 70–99)
GLUCOSE-CAPILLARY: 250 mg/dL — AB (ref 70–99)
Glucose-Capillary: 137 mg/dL — ABNORMAL HIGH (ref 70–99)
Glucose-Capillary: 222 mg/dL — ABNORMAL HIGH (ref 70–99)
Glucose-Capillary: 245 mg/dL — ABNORMAL HIGH (ref 70–99)

## 2015-03-15 LAB — CBC
HCT: 35.6 % — ABNORMAL LOW (ref 39.0–52.0)
Hemoglobin: 11.5 g/dL — ABNORMAL LOW (ref 13.0–17.0)
MCH: 28.3 pg (ref 26.0–34.0)
MCHC: 32.3 g/dL (ref 30.0–36.0)
MCV: 87.7 fL (ref 78.0–100.0)
Platelets: 296 10*3/uL (ref 150–400)
RBC: 4.06 MIL/uL — AB (ref 4.22–5.81)
RDW: 13.9 % (ref 11.5–15.5)
WBC: 27.2 10*3/uL — ABNORMAL HIGH (ref 4.0–10.5)

## 2015-03-15 LAB — PROCALCITONIN: PROCALCITONIN: 1.14 ng/mL

## 2015-03-15 LAB — MAGNESIUM: Magnesium: 2.4 mg/dL (ref 1.5–2.5)

## 2015-03-15 MED ORDER — VITAL HIGH PROTEIN PO LIQD
1000.0000 mL | ORAL | Status: DC
  Administered 2015-03-15: 1000 mL
  Filled 2015-03-15 (×3): qty 1000

## 2015-03-15 MED ORDER — METHYLPREDNISOLONE SODIUM SUCC 125 MG IJ SOLR
80.0000 mg | Freq: Three times a day (TID) | INTRAMUSCULAR | Status: DC
  Administered 2015-03-15 – 2015-03-17 (×6): 80 mg via INTRAVENOUS
  Administered 2015-03-17: 1.28 mg via INTRAVENOUS
  Administered 2015-03-17: 80 mg via INTRAVENOUS
  Filled 2015-03-15 (×9): qty 1.28

## 2015-03-15 NOTE — Progress Notes (Signed)
ANTICOAGULATION CONSULT NOTE - Follow Up Consult  Pharmacy Consult for Heparin  Indication: chest pain/ACS and atrial fibrillation  Allergies  Allergen Reactions  . Amiodarone Shortness Of Breath   Patient Measurements: Height: 5\' 6"  (167.6 cm) Weight: 189 lb 2.5 oz (85.8 kg) IBW/kg (Calculated) : 63.8  Vital Signs: Temp: 98 F (36.7 C) (04/03 0400) Temp Source: Core (Comment) (04/03 0400) BP: 101/54 mmHg (04/03 0400) Pulse Rate: 92 (04/03 0400)  Labs:  Recent Labs  03/13/15 0428 03/13/15 1230  03/14/15 0328 03/14/15 0830 03/14/15 1643 03/14/15 1833 03/15/15 0345  HGB 10.6*  --   --  11.5*  --   --   --  11.5*  HCT 34.5*  --   --  37.1*  --   --   --  35.6*  PLT 488*  --   --  316  --   --   --  296  APTT  --   --   --   --  35  --   --   --   LABPROT  --   --   --   --  25.0*  --   --   --   INR  --   --   --   --  2.24*  --   --   --   HEPARINUNFRC  --   --   --   --   --   --  <0.10* <0.10*  CREATININE 2.12* 2.91*  < > 2.90*  --  3.07*  --  2.85*  TROPONINI  --  1.97*  --  0.80*  --   --   --   --   < > = values in this interval not displayed.  Estimated Creatinine Clearance: 24.8 mL/min (by C-G formula based on Cr of 2.85).  Assessment: Sub-therapeutic heparin level despite rate increase, no issues per RN.   Goal of Therapy:  Heparin level 0.3-0.7 units/ml Monitor platelets by anticoagulation protocol: Yes   Plan:  -Increase heparin to 1500 units/hr -1300 HL -Daily CBC/HL -Monitor for bleeding  Narda Bonds 03/15/2015,4:32 AM

## 2015-03-15 NOTE — Progress Notes (Signed)
ANTICOAGULATION CONSULT NOTE - Follow Up  Pharmacy Consult for Heparin Indication: ACS/Afib  Allergies  Allergen Reactions  . Amiodarone Shortness Of Breath   Patient Measurements: Height: 5\' 6"  (167.6 cm) Weight: 187 lb 2.7 oz (84.9 kg) IBW/kg (Calculated) : 63.8 Heparin Dosing Weight: 81.6  Vital Signs: Temp: 98.3 F (36.8 C) (04/03 2315) Temp Source: Core (Comment) (04/03 2300) BP: 109/65 mmHg (04/03 2315) Pulse Rate: 112 (04/03 2315)  Labs:  Recent Labs  03/13/15 0428 03/13/15 1230  03/14/15 0328 03/14/15 0830 03/14/15 1643  03/15/15 0345 03/15/15 1430 03/15/15 1816 03/15/15 2303  HGB 10.6*  --   --  11.5*  --   --   --  11.5*  --   --   --   HCT 34.5*  --   --  37.1*  --   --   --  35.6*  --   --   --   PLT 488*  --   --  316  --   --   --  296  --   --   --   APTT  --   --   --   --  35  --   --   --   --   --   --   LABPROT  --   --   --   --  25.0*  --   --   --   --   --   --   INR  --   --   --   --  2.24*  --   --   --   --   --   --   HEPARINUNFRC  --   --   --   --   --   --   < > <0.10* 0.14*  --  0.27*  CREATININE 2.12* 2.91*  < > 2.90*  --  3.07*  --  2.85*  --  2.78*  --   TROPONINI  --  1.97*  --  0.80*  --   --   --   --   --   --   --   < > = values in this interval not displayed. Estimated Creatinine Clearance: 25.2 mL/min (by C-G formula based on Cr of 2.78).  Medical History: Past Medical History  Diagnosis Date  . Hyperlipidemia   . Depression   . Hypertension   . AAA (abdominal aortic aneurysm)     a. 01/23/15 CT: Infrarenal AAA  - 4.4cm. Monitoted by dr early  . Left ureteral calculus   . Bladder stone   . Prostate cancer     a. T2a N0 M0 Gleason 8 prostate cancer, biopsy w/ 10/12 cores positve including 2 w/ Gleason  & remainder both 7 (4+3) & 6;  b. PSA 5.5;  c. CT and PET w/o evidence for prostate Ca mets.  . Frequency of urination   . Nocturia   . BPH (benign prostatic hypertrophy)   . History of kidney stones   . Arthritis    . Wears glasses   . At risk for sleep apnea     STOP-BANG= 5 SENT TO PCP 02-17-2015  . Cancer of left lung     a. 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 Post-op course complicated by bleeding (hemothorax req chest tube) and afib.  . Atrial fibrillation     a. 02/2015 noted post-op lobectomy;  b. CHA2DS2VASc = 3-->placed on pradaxa (chosen b/c of reversal available and  potential for repeat surgeries).  . Multifocal atrial tachycardia    Medications:  Infusions:  . cisatracurium (NIMBEX) infusion Stopped (03/15/15 1900)  . feeding supplement (VITAL HIGH PROTEIN) 1,000 mL (03/15/15 2007)  . fentaNYL infusion INTRAVENOUS 150 mcg/hr (03/15/15 2200)  . heparin 10,000 units/ 20 mL infusion syringe    . heparin 1,700 Units/hr (03/15/15 2316)  . midazolam (VERSED) infusion 2 mg/hr (03/15/15 2100)  . norepinephrine (LEVOPHED) Adult infusion 23 mcg/min (03/15/15 2330)  . dialysis replacement fluid (prismasate) 400 mL/hr at 03/15/15 2309  . dialysis replacement fluid (prismasate) 200 mL/hr at 03/15/15 2308  . dialysate (PRISMASATE) 1,000 mL/hr at 03/15/15 2344  . vasopressin (PITRESSIN) infusion - *FOR SHOCK* 0.03 Units/min (03/15/15 2314)   Assessment: 69yo male admitted with hx. Afib and recently diagnosed with NSCLC.  He was on Pradaxa prior to admit.  He was started on IV heparin yesterday for possible new MI.  He has been sub-therapeutic on initial and after significant rate increase.  His anti-Xa level is low at < 0.14 on IV heparin rate of 1500 units/hr.  No noted bleeding complications.  Spoke with his RN who reports that drip has been infusing without noted problems.  Will increase rate again to try and get him to a therapeutic range.  HL remains slightly SUBtherapeutic at 0.27 on heparin 1700 units/hr. Nurse noted no issues with bleeding or infusion.  Goal of Therapy:  Heparin level 0.3-0.5 Monitor platelets by anticoagulation protocol: Yes   Plan:  Increase IV heparin to  1850 units/hr 8h HL Daily HL/CBC Monitor s/sx of bleeding  Andrey Cota. Diona Foley, PharmD Clinical Pharmacist Pager [phone]  03/15/2015,11:49 PM

## 2015-03-15 NOTE — Progress Notes (Signed)
      [address]       [place] [address]             [phone]        CARDIOTHORACIC SURGERY PROGRESS NOTE  Subjective: Paralyzed and sedated on vent  Objective: Vital signs: BP Readings from Last 1 Encounters:  03/15/15 113/67   Pulse Readings from Last 1 Encounters:  03/15/15 96   Resp Readings from Last 1 Encounters:  03/15/15 30   Temp Readings from Last 1 Encounters:  03/15/15 97.6 F (36.4 C)     Hemodynamics: CVP:  [7 mmHg-8 mmHg] 7 mmHg  Physical Exam:  Rhythm:   sinus  Breath sounds: Coarse but fairly clear, trivial airway secretions  Heart sounds:  RRR  Incisions:  Clean and dry  Abdomen:  Soft, non-distended  Extremities:  Cool but adequately perfused  Chest tubes:  Low volume thin serosanguinous output, + bilateral air leaks R > L     Intake/Output from previous day: 04/02 0701 - 04/03 0700 In: 4574.2 [I.V.:3449.2; NG/GT:645; IV Piggyback:200] Out: 4133 [Urine:50; Chest Tube:160] Intake/Output this shift:    Lab Results:  CBC: Recent Labs  03/14/15 0328 03/15/15 0345  WBC 24.2* 27.2*  HGB 11.5* 11.5*  HCT 37.1* 35.6*  PLT 316 296    BMET:  Recent Labs  03/14/15 1643 03/15/15 0345  NA 141 139  K 4.5 4.5  CL 101 98  CO2 26 26  GLUCOSE 195* 196*  BUN 52* 50*  CREATININE 3.07* 2.85*  CALCIUM 7.2* 7.3*     CBG (last 3)   Recent Labs  03/15/15 0015 03/15/15 0426 03/15/15 0746  GLUCAP 137* 174* 222*    ABG    Component Value Date/Time   PHART 7.397 03/15/2015 1032   PCO2ART 46.6* 03/15/2015 1032   PO2ART 85.5 03/15/2015 1032   HCO3 28.1* 03/15/2015 1032   TCO2 29.5 03/15/2015 1032   ACIDBASEDEF 10.0* 03/14/2015 1115   O2SAT 94.7 03/15/2015 1032    CXR: PORTABLE CHEST - 1 VIEW  COMPARISON: One day prior  FINDINGS: Numerous leads and wires project over the chest. Endotracheal tube terminates 4.3 cm above carina. Right IJ catheter terminates the mid SVC. Left IJ catheter terminates at  low SVC. Nasogastric extends beyond the inferior aspect of the film. Bilateral chest tubes.  Cardiomegaly accentuated by AP portable technique. Atherosclerosis in the transverse aorta. Similar left pleural effusion. Approximately 20% inferior lateral right pneumothorax is identified. Similar to on the 03/13/2015 exam. Mild interstitial edema. Bibasilar airspace disease. Subcutaneous emphysema about the right supraclavicular region is decreased.  IMPRESSION: No change in aeration, with similar interstitial edema, left pleural effusion, and bibasilar airspace disease.  Bilateral chest tubes remain in place. Approximately 20% inferior lateral right-sided pneumothorax.   Electronically Signed  By: Abigail Miyamoto M.D.  On: 03/15/2015 09:31   Assessment/Plan:  Overall stable over last 48 hours although remains critically ill Oxygenation adequate on FiO2 50% and PEEP decreased to 8 CXR appearance stable w/ small right lateral PTX, tubes in good position NSR w/ stable hemodynamics but requiring levophed and vasopressin for BP support Remains in oliguric ARF on CVVHD, keeping I/O's even for now   Continue plan as outlined per Pulm/CCM team  Rexene Alberts 03/15/2015 12:15 PM

## 2015-03-15 NOTE — Op Note (Addendum)
CARDIOTHORACIC SURGERY OPERATIVE NOTE  Date of Procedure:  03/15/2015  Preoperative Diagnosis: Right Pneumothorax  Postoperative Diagnosis: Same  Procedure:   Right chest tube placement  Surgeon:   Valentina Gu. Roxy Manns, MD  Anesthesia:   Intravenous sedation    DETAILS OF THE OPERATIVE PROCEDURE  Following full informed consent the patient obtained from the patient's wife, the patient was continuously monitored for rhythm, BP and oxygen saturation while he remained heavily sedated and paralyzed in the MICU. The right anterolateral chest was prepared and draped in a sterile manner.  A small incision was made and a 28 French straight chest tube was placed through the incision into the pleural space. The tube was secured to the skin and connected to a closed suction collection device. The patient tolerated the procedure well.  A portable CXR was ordered. There were no complications.   After chest tube placement the new tube was transiently placed to 10 cm H2O suction while the pre-existing right chest tube remained on water seal.  Immediately the patient's spontaneous exhaled tidal volumes and exhaled minute volume dropped to unacceptable levels.  The tube was taken off of suction and both right chest tubes were left on water seal.     Valentina Gu. Roxy Manns, MD 03/15/2015 4:59 PM

## 2015-03-15 NOTE — Progress Notes (Signed)
[facility] Rounding Note  Subjective:  Remains intubated and sedated No filter or catheter issues reported to me since yesterday Remains pressor dependent Poor UOP 35 cc/24 hours Had cards eval yesterday for new drop in EF to 25% with [facility] Also with large air leak on the right requiring PEEP reduction Minimal UOP yesterday (50)   Objective Vital signs in last 24 hours: Filed Vitals:   03/15/15 0630 03/15/15 0645 03/15/15 0700 03/15/15 0715  BP: 102/66 110/70 121/63 113/67  Pulse: 101 99 98 96  Temp: 97.6 F (36.4 C) 97.6 F (36.4 C) 97.6 F (36.4 C) 97.6 F (36.4 C)  TempSrc:   Core (Comment)   Resp: 30 27 30 30   Height:      Weight:      SpO2: 94% 94% 94% 94%   Weight change: -0.9 kg (-1 lb 15.7 oz)  Intake/Output Summary (Last 24 hours) at 03/15/15 0903 Last data filed at 03/15/15 0700  Gross per 24 hour  Intake 4276.15 ml  Output   4133 ml  Net 143.15 ml   Physical Exam:  BP 113/67 mmHg  Pulse 96  Temp(Src) 97.6 F (36.4 C) (Core (Comment))  Resp 30  Ht 5\' 6"  (1.676 m)  Wt 84.9 kg (187 lb 2.7 oz)  BMI 30.22 kg/m2  SpO2 94%  General: Intubated, ill appearing R IJ HD cath, L IJ line, ETT, NG tube, bilateral chest tubes Neuro:sedated and paralyzed  HEENT:No  JVD, +Crepitus in the neck increased on the right Cardiovascular: S1S2  irreg No S3 Heart sounds distant Lungs:Diminished bases/rhonchous Abd soft some BS No edema of LE's   Labs: Basic Metabolic Panel:  Recent Labs Lab 03/12/15 0525 03/13/15 0428 03/13/15 1230 03/13/15 1717 03/14/15 0328 03/14/15 1000 03/14/15 1643 03/14/15 2055 03/15/15 0345  NA 140 143 146* 145 141  --  141  --  139  K 4.2 5.9* 5.2* 4.9 4.8  --  4.5  --  4.5  CL 105 109 105 103 102  --  101  --  98  CO2 25 26 28 26 31   --  26  --  26  GLUCOSE 167* 234* 286* 280* 216*  --  195*  --  196*  BUN 28* 44* 55* 54* 47*  --  52*  --  50*  CREATININE 1.01 2.12* 2.91* 3.04* 2.90*  --  3.07*  --  2.85*   CALCIUM 8.2* 7.3* 7.3* 7.2* 7.3*  --  7.2*  --  7.3*  PHOS 3.2  --   --  7.1*  --  6.8* 5.1* 4.6 4.2      Recent Labs Lab 03/08/15 1156  03/14/15 0328 03/14/15 1643 03/15/15 0345  AST 78*  --  1868*  --   --   ALT 118*  --  3284*  --   --   ALKPHOS 86  --  118*  --   --   BILITOT 0.9  --  0.8  --   --   PROT 5.6*  --  5.6*  --   --   ALBUMIN 2.0*  < > 1.9* 1.8* 1.8*  < > = values in this interval not displayed.  :  Recent Labs Lab 03/08/15 1156  03/12/15 0525 03/13/15 0428 03/14/15 0328 03/15/15 0345  WBC 21.2*  < > 17.8* 23.8* 24.2* 27.2*  NEUTROABS 17.9*  --   --   --   --   --   HGB 10.8*  < > 10.4* 10.6* 11.5*  11.5*  HCT 33.2*  < > 32.4* 34.5* 37.1* 35.6*  MCV 89.5  < > 88.5 92.2 90.0 87.7  PLT 594*  < > 428* 488* 316 296  < > = values in this interval not displayed.   Recent Labs Lab 03/10/15 1328 03/10/15 1822 03/11/15 0044 03/13/15 1230 03/14/15 0328  TROPONINI <0.03 <0.03 0.03 1.97* 0.80*      Recent Labs Lab 03/14/15 1531 03/14/15 2000 03/15/15 0015 03/15/15 0426 03/15/15 0746  GLUCAP 173* 178* 137* 174* 222*    Studies/Results: Dg Chest Port 1 View  03/14/2015   CLINICAL DATA:  ARDSHx of cancer, HTN, AAA, atrial fibrillation, multifocal atrial tachycardia  EXAM: PORTABLE CHEST - 1 VIEW  COMPARISON:  03/13/2015  FINDINGS: Right lateral basilar pneumothorax seen on the previous day study is not currently evident, although the difference may be due to differences in positioning.  Right subcutaneous emphysema is stable. Support apparatus including bilateral chest tubes, endotracheal tube, nasogastric tube, left internal jugular and right internal jugular central venous lines are all stable.  Dense consolidation is noted in the left lower lung zone. More hazy airspace opacity is noted in the right lower lung zone. Right lower lung zone opacity appears mildly improved. No other change.  IMPRESSION: 1. Mild improvement in airspace lung opacity at the  right lung base. No new lung abnormalities. 2. Small lateral basilar pneumothorax seen on the previous day study is not evident currently. It may be improved although the difference could be positional on this semi-erect study. No other evidence of a change. 3. Stable support apparatus.   Electronically Signed   By: Lajean Manes M.D.   On: 03/14/2015 08:32   Dg Chest Port 1 View  03/13/2015   CLINICAL DATA:  Central catheter placement  EXAM: PORTABLE CHEST - 1 VIEW  COMPARISON:  Study obtained earlier in the day  FINDINGS: There is a new central catheter with the tip in the superior vena cava. Endotracheal tube tip is 3.9 cm above the carina. Nasogastric tube tip and side port are in the stomach. Left central catheter tip is in the superior vena cava. There are bilateral chest tubes. The pneumothorax noted earlier in the day in the lateral right base is smaller. No new pneumothorax seen compared to earlier in the day. There is less volume loss in the right lower lobe, although there remains interstitial and patchy alveolar consolidation in this area. Somewhat greater consolidation the left lower lobe remains. Heart is enlarged but stable. Pulmonary vascularity is normal. No adenopathy. There is subcutaneous air on the right as well as probable pneumomediastinum in the neck region, stable.  IMPRESSION: New central catheter tip in superior vena cava. Right lateral basilar pneumothorax is smaller compared to earlier in the day. The subcutaneous air in apparent pneumomediastinum remain. Less volume loss right base compared to earlier in the day. Areas of interstitial and alveolar consolidation in the lower lobes, more on the left than on the right, remain stable. Cardiomegaly is stable.   Electronically Signed   By: Lowella Grip III M.D.   On: 03/13/2015 13:20   Dg Chest Port 1 View  03/13/2015   CLINICAL DATA:  Bilateral chest tube placement.  EXAM: PORTABLE CHEST - 1 VIEW  COMPARISON:  Study obtained earlier  in the day  FINDINGS: There are bilateral chest tubes apparent. There is a pneumothorax at the lateral right base, increased from prior study. No pneumothorax is currently seen on the left. No tension component. There  is extensive subcutaneous air on the right. There appears to be some pneumomediastinum in the neck region as well.  There is volume loss in the right lower lobe with interstitial and consolidation in this area. There is persistent consolidation in the left lower lobe with left effusion.  Central catheter tip is in the superior vena cava. Endotracheal tube tip is 3.3 cm above the carina.  IMPRESSION: Chest tubes have been placed bilaterally. There is an increased lateral right base pneumothorax. Given this change, close clinical and imaging surveillance remain advised. There is bibasilar consolidation with increase in volume loss in the right lower lobe compared to earlier in the day. No change in cardiac silhouette. Extensive subcutaneous air remains as does probable pneumomediastinum in the neck region.  Comment:  Dr. Elsworth Soho aware of these findings.   Electronically Signed   By: Lowella Grip III M.D.   On: 03/13/2015 09:28   Medications: . cisatracurium (NIMBEX) infusion 3 mcg/kg/min (03/15/15 0700)  . feeding supplement (VITAL HIGH PROTEIN)    . fentaNYL infusion INTRAVENOUS 150 mcg/hr (03/15/15 0700)  . heparin 10,000 units/ 20 mL infusion syringe    . heparin 1,500 Units/hr (03/15/15 0700)  . midazolam (VERSED) infusion 1.5 mg/hr (03/15/15 0700)  . norepinephrine (LEVOPHED) Adult infusion 24 mcg/min (03/15/15 0700)  . dialysis replacement fluid (prismasate) 400 mL/hr at 03/14/15 2139  . dialysis replacement fluid (prismasate) 200 mL/hr at 03/14/15 2138  . dialysate (PRISMASATE) 1,000 mL/hr at 03/15/15 0322  . vasopressin (PITRESSIN) infusion - *FOR SHOCK* 0.03 Units/min (03/15/15 0700)   . antiseptic oral rinse  7 mL Mouth Rinse q12n4p  . antiseptic oral rinse  7 mL Mouth Rinse  QID  . artificial tears  1 application Both Eyes 3 times per day  . aspirin  81 mg Per Tube Daily  . chlorhexidine  15 mL Mouth Rinse BID  . docusate  100 mg Oral BID  . feeding supplement (PRO-STAT SUGAR FREE 64)  30 mL Per Tube Daily  . insulin aspart  0-20 Units Subcutaneous 6 times per day  . insulin glargine  10 Units Subcutaneous Daily  . meropenem (MERREM) IV  1 g Intravenous Q12H  . methylPREDNISolone (SOLU-MEDROL) injection  80 mg Intravenous 3 times per day  . pantoprazole (PROTONIX) IV  40 mg Intravenous Daily  . sodium chloride  3 mL Intravenous Q12H  . vancomycin  750 mg Intravenous Q24H   BACKGROUND  BACKGROUND 69 y.o. 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 Jeffie Pollock), who was readmitted to the hospital 1 week after his lobectomy (12/23) with progressive SOB, hypoxemia. He was treated for PNA and also given diuretics with progressive worsening. Contrasted chest CT (3/27) showed diffuse ground glass opacities. Amio was stopped (re ? of amio lung), steroids added. He subsequently worsened requiring intubation 3/31, and because of high PEEP developed bilateral pneumothoraces requiring chest tubes today (4/1) . He developed progressive hypotension requiring pressors  despite fluid boluses, bicarb drip for acidosis, hyperkalemia, and  anuric. CRRT initiated 03/13/15.    1. Anuric AKI in the setting of VDRF/shock/ARDS - Remains oligoanuric.  CRRT initiated 4/1. Probs with filter clotting (added heparin) and catheter (s/p TPA) resolved and now running well. Keeping volume even. All 4K fluids. Heparin anticoagulation. K and phos OK. No change in prescription. CCM has stopped bicarb drip. 2.  ARDS - in setting recent lllobectomy, HCAP, possible amio lung, barotrauma, bilateral PTX/chest tubes - per CCM. Resp panel  pending on BAL so now on droplet prec's Steroids/ATB's/chest tubes/vent support 3. Septic shock - ATB's and pressors per  CCM. EF 25% 4. Shock liver  5. HCAP  - atbs         6. Cardiomyopathy - ECHO with newly reduced EF 20-25% and new WMA's. Cards following. Options limited due to severity of medical illness.            Jamal Maes, MD [facility] [phone] pager 03/15/2015, 9:03 AM

## 2015-03-15 NOTE — Progress Notes (Signed)
[address]       [place] [address]             [phone]        CARDIOTHORACIC SURGERY PROGRESS NOTE  Subjective: Called to see patient by Dr Elsworth Soho due to increased difficulty with ventilation due to large air leak via right chest tube.  Numerous maneuvers attempted.  I recommended temporarily taking the right chest tube off of suction and placing it to water seal and allowing temporary collapse of the right lung to see if that might help ventilatory status.  At present patient remains paralyzed and sedated on vent.  Wife at bedside.  Objective: Vital signs: BP Readings from Last 1 Encounters:  03/15/15 113/67   Pulse Readings from Last 1 Encounters:  03/15/15 96   Resp Readings from Last 1 Encounters:  03/15/15 30   Temp Readings from Last 1 Encounters:  03/15/15 97.6 F (36.4 C)     Hemodynamics: CVP:  [7 mmHg-8 mmHg] 7 mmHg  Physical Exam:  Rhythm:   sinus  Breath sounds: coarse  Heart sounds:  RRR  Incisions:  Not assessed  Abdomen:  Soft, non-distended  Extremities:  Warm, well-perfused  Chest tubes:  Low volume thin serosanguinous output, + bilateral air leak, large on right    Intake/Output from previous day: 04/02 0701 - 04/03 0700 In: 4574.2 [I.V.:3449.2; NG/GT:645; IV Piggyback:200] Out: 4133 [Urine:50; Chest Tube:160] Intake/Output this shift: Total I/O In: -  Out: 1617 [Other:1617]  Lab Results:  CBC: Recent Labs  03/14/15 0328 03/15/15 0345  WBC 24.2* 27.2*  HGB 11.5* 11.5*  HCT 37.1* 35.6*  PLT 316 296    BMET:  Recent Labs  03/14/15 1643 03/15/15 0345  NA 141 139  K 4.5 4.5  CL 101 98  CO2 26 26  GLUCOSE 195* 196*  BUN 52* 50*  CREATININE 3.07* 2.85*  CALCIUM 7.2* 7.3*     CBG (last 3)   Recent Labs  03/15/15 0426 03/15/15 0746 03/15/15 1231  GLUCAP 174* 222* 184*    ABG    Component Value Date/Time   PHART 7.367 03/15/2015 1538   PCO2ART 47.5* 03/15/2015 1538   PO2ART 215.0*  03/15/2015 1538   HCO3 27.3* 03/15/2015 1538   TCO2 29 03/15/2015 1538   ACIDBASEDEF 10.0* 03/14/2015 1115   O2SAT 100.0 03/15/2015 1538    CXR: PORTABLE CHEST - 1 VIEW  COMPARISON: 03/15/2015 at 12:36 p.m.  FINDINGS: Support apparatus is stable. Increased, now moderate, right pneumothorax is identified. Minimal leftward midline shift may indicate tension.  IMPRESSION: Increased, now moderate, right pneumothorax, with possible tension. Critical Value/emergent results were called by telephone at the time of interpretation on 03/15/2015 at 3:58 pm to [facility] for Dr. Kara Mead , who verbally acknowledged these results.   Electronically Signed  By: Conchita Paris M.D.  On: 03/15/2015 16:01   Assessment/Plan:  The patient has a large persistent air leak associated with spontaneous pneumothorax on the right side which has gotten worse and now is associated with inability to maintain adequate ventilation despite cutting PEEP to 5.  Placement of the right chest tube to water seal has resulted in a dramatic improvement in ventilation with adequate exhaled tidal volume and essentially normal blood gas.  However, as expected the right pneumothorax has enlarged considerably.  Options discussed with Dr Elsworth Soho and the patient's wife.  High frequency jet ventilation might be helpful, but apparently is not currently available.  Re-intubation using  a dual-lumen ET tube could be considered for separate ventilation, but the patient recently underwent left lower lobectomy and I doubt that he would tolerate stopping all ventilation to the right lung.  I doubt that the patient should be considered a candidate for ECMO support given the presence of advanced stage lung cancer, particularly given the unclear reasons for the patient's current problems with acute respiratory failure.  At present I favor placement of a second chest tube on the right side to see if that might help and allow  re-application of low level suction to cause the right lung partially re-expand and prevent the development of tension pneumothorax.  However, it might not help.  It may be that allowing the right lung to remain collapsed temporarily might facilitate ventilation with lower airway pressure requirements and buy some time for the air leak to seal and/or become easier to manage.  I discussed matters at length with the patient's wife at the bedside, including the relatively poor prognosis and the possibility that his condition might deteriorate further and lead to cardiac/respiratory arrest and death.  She specifically requests that he remain a FULL CODE BLUE.   Rexene Alberts 03/15/2015 4:32 PM

## 2015-03-15 NOTE — Progress Notes (Signed)
ANTICOAGULATION CONSULT NOTE - Follow Up  Pharmacy Consult for Heparin Indication: ACS/Afib  Allergies  Allergen Reactions  . Amiodarone Shortness Of Breath   Patient Measurements: Height: 5\' 6"  (167.6 cm) Weight: 187 lb 2.7 oz (84.9 kg) IBW/kg (Calculated) : 63.8 Heparin Dosing Weight: 81.6  Vital Signs: Temp: 97.6 F (36.4 C) (04/03 0715) Temp Source: Core (Comment) (04/03 0700) BP: 113/67 mmHg (04/03 0715) Pulse Rate: 96 (04/03 0715)  Labs:  Recent Labs  03/13/15 0428 03/13/15 1230  03/14/15 0328 03/14/15 0830 03/14/15 1643 03/14/15 1833 03/15/15 0345 03/15/15 1430  HGB 10.6*  --   --  11.5*  --   --   --  11.5*  --   HCT 34.5*  --   --  37.1*  --   --   --  35.6*  --   PLT 488*  --   --  316  --   --   --  296  --   APTT  --   --   --   --  35  --   --   --   --   LABPROT  --   --   --   --  25.0*  --   --   --   --   INR  --   --   --   --  2.24*  --   --   --   --   HEPARINUNFRC  --   --   --   --   --   --  <0.10* <0.10* 0.14*  CREATININE 2.12* 2.91*  < > 2.90*  --  3.07*  --  2.85*  --   TROPONINI  --  1.97*  --  0.80*  --   --   --   --   --   < > = values in this interval not displayed. Estimated Creatinine Clearance: 24.6 mL/min (by C-G formula based on Cr of 2.85).  Medical History: Past Medical History  Diagnosis Date  . Hyperlipidemia   . Depression   . Hypertension   . AAA (abdominal aortic aneurysm)     a. 01/23/15 CT: Infrarenal AAA  - 4.4cm. Monitoted by dr early  . Left ureteral calculus   . Bladder stone   . Prostate cancer     a. T2a N0 M0 Gleason 8 prostate cancer, biopsy w/ 10/12 cores positve including 2 w/ Gleason  & remainder both 7 (4+3) & 6;  b. PSA 5.5;  c. CT and PET w/o evidence for prostate Ca mets.  . Frequency of urination   . Nocturia   . BPH (benign prostatic hypertrophy)   . History of kidney stones   . Arthritis   . Wears glasses   . At risk for sleep apnea     STOP-BANG= 5 SENT TO PCP 02-17-2015  . Cancer of left  lung     a. 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 Post-op course complicated by bleeding (hemothorax req chest tube) and afib.  . Atrial fibrillation     a. 02/2015 noted post-op lobectomy;  b. CHA2DS2VASc = 3-->placed on pradaxa (chosen b/c of reversal available and potential for repeat surgeries).  . Multifocal atrial tachycardia    Medications:  Infusions:  . cisatracurium (NIMBEX) infusion 3 mcg/kg/min (03/15/15 1231)  . feeding supplement (VITAL HIGH PROTEIN) 1,000 mL (03/15/15 0951)  . fentaNYL infusion INTRAVENOUS 150 mcg/hr (03/15/15 0700)  . heparin 10,000 units/ 20 mL infusion syringe    .  heparin 1,500 Units/hr (03/15/15 0700)  . midazolam (VERSED) infusion 1.5 mg/hr (03/15/15 0700)  . norepinephrine (LEVOPHED) Adult infusion 24 mcg/min (03/15/15 1232)  . dialysis replacement fluid (prismasate) 400 mL/hr at 03/15/15 1022  . dialysis replacement fluid (prismasate) 200 mL/hr at 03/14/15 2138  . dialysate (PRISMASATE) 1,000 mL/hr at 03/15/15 1340  . vasopressin (PITRESSIN) infusion - *FOR SHOCK* 0.03 Units/min (03/15/15 0700)   Assessment: 69yo male admitted with hx. Afib and recently diagnosed with NSCLC.  He was on Pradaxa prior to admit.  He was started on IV heparin yesterday for possible new MI.  He has been sub-therapeutic on initial and after significant rate increase.  His anti-Xa level is low at < 0.14 on IV heparin rate of 1500 units/hr.  No noted bleeding complications.  Spoke with his RN who reports that drip has been infusing without noted problems.  Will increase rate again to try and get him to a therapeutic range.  Goal of Therapy:  Heparin level 0.3-0.5 Monitor platelets by anticoagulation protocol: Yes   Plan:  - Increase IV heparin to 1700 units/hr - Will check heparin level again in 8 hours -  Monitor for bleeding complications  Rober Minion, PharmD., MS Clinical Pharmacist Pager:  [phone] Thank you for allowing pharmacy to be part of this  patients care team. 03/15/2015,3:18 PM

## 2015-03-15 NOTE — Progress Notes (Signed)
Name: Russell [name] MRN: [id number] DOB: 24-Apr-1946    ADMISSION DATE:  02/10/2015 CONSULTATION DATE:  3/28  REFERRING MD :  Roxan Hockey   CHIEF COMPLAINT:  Pulmonary infiltrates, hypoxia   BRIEF PATIENT DESCRIPTION: 69yo male with hx Afib, HTN, newly dx non-small cell lung ca, s/p recent LLLobectomy 3/15, pos LNs.  He was d/c post op and returned 3/23 to the office with progressive SOB & BL infx.  In office his sats were in 70's on RA and he was tx to [facility].  He was admitted by CVTS with ?HCAP v pulm edema.  He was treated with IV abx and lasix but remained SOB and CT revealed diffuse ground glass opacities, amiodarone was stopped and PCCM consulted for further recs.   SIGNIFICANT EVENTS  CTA chest 3/27>>> NEG PE, Interval development of diffuse bilateral predominately ground-glass and associated consolidative pulmonary opacities which may be secondary to multi focal infection or potentially an inflammatory process such as drug reaction. Hemorrhage could have a similar appearance. 3/29 tx to ICU increased FIO2 needs 3/31 intubated-ARDS protocol 4/1 BL pnthx- BL chest tubes  4/1 CRRT   STUDIES:  2D echo 3/25>>>EF 50-55%, trivial pericardiac effusion, small L pleural effusion, trivial MR, mild Pulm regurg  SUBJECTIVE: ARDS protocol, deeply sedated & paralysed - remains on pressors on CRRT  -Large air leak on right chest tube with loss of Tv   VITAL SIGNS: Temp:  [95.8 F (35.4 C)-100.5 F (38.1 C)] 97.6 F (36.4 C) (04/03 0715) Pulse Rate:  [30-111] 96 (04/03 0715) Resp:  [13-31] 30 (04/03 0715) BP: (80-158)/(32-104) 113/67 mmHg (04/03 0715) SpO2:  [92 %-100 %] 94 % (04/03 0715) FiO2 (%):  [60 %-100 %] 60 % (04/03 0700) Weight:  [84.9 kg (187 lb 2.7 oz)] 84.9 kg (187 lb 2.7 oz) (04/03 0445)  PHYSICAL EXAMINATION: General:  Pleasant, chronically ill appearing male,  Neuro:sedated , paralysed , BIS 35 HEENT:  Mm dry, no JVD, neck crepitus +  Cardiovascular:   s1s2 irreg  Lungs:  diminished bases otherwise fairly clear, L chest dressing c/d ,large air leak on right, non eon left  Abdomen:  Soft, nt, nd, +bs  Musculoskeletal:  Warm and dry, no edema   Recent Labs Lab 03/14/15 0328 03/14/15 1643 03/15/15 0345  NA 141 141 139  K 4.8 4.5 4.5  CL 102 101 98  CO2 31 26 26   BUN 47* 52* 50*  CREATININE 2.90* 3.07* 2.85*  GLUCOSE 216* 195* 196*    Recent Labs Lab 03/13/15 0428 03/14/15 0328 03/15/15 0345  HGB 10.6* 11.5* 11.5*  HCT 34.5* 37.1* 35.6*  WBC 23.8* 24.2* 27.2*  PLT 488* 316 296   Dg Chest Port 1 View  03/14/2015   CLINICAL DATA:  ARDSHx of cancer, HTN, AAA, atrial fibrillation, multifocal atrial tachycardia  EXAM: PORTABLE CHEST - 1 VIEW  COMPARISON:  03/13/2015  FINDINGS: Right lateral basilar pneumothorax seen on the previous day study is not currently evident, although the difference may be due to differences in positioning.  Right subcutaneous emphysema is stable. Support apparatus including bilateral chest tubes, endotracheal tube, nasogastric tube, left internal jugular and right internal jugular central venous lines are all stable.  Dense consolidation is noted in the left lower lung zone. More hazy airspace opacity is noted in the right lower lung zone. Right lower lung zone opacity appears mildly improved. No other change.  IMPRESSION: 1. Mild improvement in airspace lung opacity at the right lung base. No new lung  abnormalities. 2. Small lateral basilar pneumothorax seen on the previous day study is not evident currently. It may be improved although the difference could be positional on this semi-erect study. No other evidence of a change. 3. Stable support apparatus.   Electronically Signed   By: Lajean Manes M.D.   On: 03/14/2015 08:32   Dg Chest Port 1 View  03/13/2015   CLINICAL DATA:  Central catheter placement  EXAM: PORTABLE CHEST - 1 VIEW  COMPARISON:  Study obtained earlier in the day  FINDINGS: There is a new central  catheter with the tip in the superior vena cava. Endotracheal tube tip is 3.9 cm above the carina. Nasogastric tube tip and side port are in the stomach. Left central catheter tip is in the superior vena cava. There are bilateral chest tubes. The pneumothorax noted earlier in the day in the lateral right base is smaller. No new pneumothorax seen compared to earlier in the day. There is less volume loss in the right lower lobe, although there remains interstitial and patchy alveolar consolidation in this area. Somewhat greater consolidation the left lower lobe remains. Heart is enlarged but stable. Pulmonary vascularity is normal. No adenopathy. There is subcutaneous air on the right as well as probable pneumomediastinum in the neck region, stable.  IMPRESSION: New central catheter tip in superior vena cava. Right lateral basilar pneumothorax is smaller compared to earlier in the day. The subcutaneous air in apparent pneumomediastinum remain. Less volume loss right base compared to earlier in the day. Areas of interstitial and alveolar consolidation in the lower lobes, more on the left than on the right, remain stable. Cardiomegaly is stable.   Electronically Signed   By: Lowella Grip III M.D.   On: 03/13/2015 13:20   Dg Chest Port 1 View  03/13/2015   CLINICAL DATA:  Bilateral chest tube placement.  EXAM: PORTABLE CHEST - 1 VIEW  COMPARISON:  Study obtained earlier in the day  FINDINGS: There are bilateral chest tubes apparent. There is a pneumothorax at the lateral right base, increased from prior study. No pneumothorax is currently seen on the left. No tension component. There is extensive subcutaneous air on the right. There appears to be some pneumomediastinum in the neck region as well.  There is volume loss in the right lower lobe with interstitial and consolidation in this area. There is persistent consolidation in the left lower lobe with left effusion.  Central catheter tip is in the superior vena  cava. Endotracheal tube tip is 3.3 cm above the carina.  IMPRESSION: Chest tubes have been placed bilaterally. There is an increased lateral right base pneumothorax. Given this change, close clinical and imaging surveillance remain advised. There is bibasilar consolidation with increase in volume loss in the right lower lobe compared to earlier in the day. No change in cardiac silhouette. Extensive subcutaneous air remains as does probable pneumomediastinum in the neck region.  Comment:  Dr. Elsworth Soho aware of these findings.   Electronically Signed   By: Lowella Grip III M.D.   On: 03/13/2015 09:28       ASSESSMENT / PLAN:  PULMONARY OETT 3/31 >> A: ARDS - multifactorial in setting recent LLLobectomy for NSCLCA, HCAP -treated with abx, concern for amiodarone toxicity given CT appearance. Amiodarone stopped 3/27.  Barotrauma - BL pneumothoraces & sub cut emphysema 4/1 P:   Drop solumedrol 80 mg IV q8  ARDS protocol -keep at 8 cc /kg , drop RR to 30 to avoid autoPEEP, lower PEEP  to 8 given large air leak on right Chest tubes per TCTS - ensure suction, right pnthx  on CXR  Ct Nimbex another 24h   CARDIOVASCULAR CVL 3/31 >> A: septic shock Paroxysmal AF Elevated trop -peak 1.9 New cardiomyopathy -Echo - EF 25 %, akinetic septum & inferior wall, no sig effusion P:  Levophed gtt ct vaso gtt Hold pradaxa -   heparin IV  Cards input, ASA daily   RENAL A:  AKI  Hyperkalemia Acute resp acidosis -resolved P:   Dc Bicarb gtt ct CRRT - per Renal   GASTROINTESTINAL A:  Shock liver Protein cal malnutrition P:   Npo for now Keep TFs @ 20/h IV PPI  HEMATOLOGIC A:  No issues P:  Monitor on IV heparin  INFECTIOUS A:  HCAP P:    Sputum 4/1 >> Abx: zosyn /vanc 3/23 >> 4/1 Cefepime 4/1 >  Follow RVP>  ENDOCRINE A:  Hyperglycemia , related to stress, steroids  P:   SSI Added lantus 10 , lowered steroids  NEUROLOGIC A:  Acute encephalopathy P:   RASS goal: -5 while  paralysed Versed/fent gtt   FAMILY  - Updates: wife in detail 4/2  - Inter-disciplinary family meet or Palliative Care meeting due by:  4/7    TODAY'S SUMMARY: ARDS with BL infiltrates of presumed inflammatory etiology post lobectomy, now with barotrauma & multi-organ failure XR improved vs PEEP effect, driop PEEP due to large rt air leak  Concern for new LV dysfunction    The patient is critically ill with multiple organ systems failure and requires high complexity decision making for assessment and support, frequent evaluation and titration of therapies, application of advanced monitoring technologies and extensive interpretation of multiple databases. Critical Care Time devoted to patient care services described in this note independent of APP time is 35 minutes.   Kara Mead MD. Sh[place] Flood. [facility] Pager [phone] If no response call [place] MD  03/15/2015, 8:10 AM

## 2015-03-16 ENCOUNTER — Inpatient Hospital Stay (HOSPITAL_COMMUNITY): Payer: Medicare Other

## 2015-03-16 DIAGNOSIS — J860 Pyothorax with fistula: Secondary | ICD-10-CM

## 2015-03-16 DIAGNOSIS — R579 Shock, unspecified: Secondary | ICD-10-CM

## 2015-03-16 LAB — RENAL FUNCTION PANEL
ANION GAP: 10 (ref 5–15)
Albumin: 1.9 g/dL — ABNORMAL LOW (ref 3.5–5.2)
Albumin: 1.9 g/dL — ABNORMAL LOW (ref 3.5–5.2)
Anion gap: 11 (ref 5–15)
BUN: 52 mg/dL — ABNORMAL HIGH (ref 6–23)
BUN: 51 mg/dL — ABNORMAL HIGH (ref 6–23)
CALCIUM: 7.7 mg/dL — AB (ref 8.4–10.5)
CHLORIDE: 101 mmol/L (ref 96–112)
CO2: 26 mmol/L (ref 19–32)
CO2: 24 mmol/L (ref 19–32)
Calcium: 7.7 mg/dL — ABNORMAL LOW (ref 8.4–10.5)
Chloride: 101 mmol/L (ref 96–112)
Creatinine, Ser: 2.7 mg/dL — ABNORMAL HIGH (ref 0.50–1.35)
Creatinine, Ser: 2.72 mg/dL — ABNORMAL HIGH (ref 0.50–1.35)
GFR calc Af Amer: 26 mL/min — ABNORMAL LOW (ref >90)
GFR calc non Af Amer: 22 mL/min — ABNORMAL LOW (ref >90)
GFR, EST AFRICAN AMERICAN: 26 mL/min — AB (ref >90)
GFR, EST NON AFRICAN AMERICAN: 22 mL/min — AB (ref >90)
GLUCOSE: 244 mg/dL — AB (ref 70–99)
Glucose, Bld: 194 mg/dL — ABNORMAL HIGH (ref 70–99)
POTASSIUM: 5.1 mmol/L (ref 3.5–5.1)
Phosphorus: 5 mg/dL — ABNORMAL HIGH (ref 2.3–4.6)
Phosphorus: 5.9 mg/dL — ABNORMAL HIGH (ref 2.3–4.6)
Potassium: 4.9 mmol/L (ref 3.5–5.1)
SODIUM: 138 mmol/L (ref 135–145)
Sodium: 135 mmol/L (ref 135–145)

## 2015-03-16 LAB — CBC
HEMATOCRIT: 37.4 % — AB (ref 39.0–52.0)
HEMOGLOBIN: 11.8 g/dL — AB (ref 13.0–17.0)
MCH: 28.5 pg (ref 26.0–34.0)
MCHC: 31.6 g/dL (ref 30.0–36.0)
MCV: 90.3 fL (ref 78.0–100.0)
Platelets: 347 10*3/uL (ref 150–400)
RBC: 4.14 MIL/uL — ABNORMAL LOW (ref 4.22–5.81)
RDW: 14.4 % (ref 11.5–15.5)
WBC: 35.1 10*3/uL — ABNORMAL HIGH (ref 4.0–10.5)

## 2015-03-16 LAB — POCT I-STAT 3, ART BLOOD GAS (G3+)
ACID-BASE DEFICIT: 3 mmol/L — AB (ref 0.0–2.0)
Bicarbonate: 27.8 mEq/L — ABNORMAL HIGH (ref 20.0–24.0)
Bicarbonate: 28.5 mEq/L — ABNORMAL HIGH (ref 20.0–24.0)
Bicarbonate: 26.5 mEq/L — ABNORMAL HIGH (ref 20.0–24.0)
O2 SAT: 84 %
O2 Saturation: 95 %
O2 Saturation: 82 %
PH ART: 7.299 — AB (ref 7.350–7.450)
PO2 ART: 53 mmHg — AB (ref 80.0–100.0)
Patient temperature: 98.2
TCO2: 30 mmol/L (ref 0–100)
TCO2: 30 mmol/L (ref 0–100)
TCO2: 29 mmol/L (ref 0–100)
pCO2 arterial: 56.5 mmHg — ABNORMAL HIGH (ref 35.0–45.0)
pCO2 arterial: 64.8 mmHg (ref 35.0–45.0)
pCO2 arterial: 64.9 mmHg (ref 35.0–45.0)
pH, Arterial: 7.248 — ABNORMAL LOW (ref 7.350–7.450)
pH, Arterial: 7.21 — ABNORMAL LOW (ref 7.350–7.450)
pO2, Arterial: 86 mmHg (ref 80.0–100.0)
pO2, Arterial: 56 mmHg — ABNORMAL LOW (ref 80.0–100.0)

## 2015-03-16 LAB — HEPARIN LEVEL (UNFRACTIONATED)
Heparin Unfractionated: 0.54 IU/mL (ref 0.30–0.70)
Heparin Unfractionated: 0.58 IU/mL (ref 0.30–0.70)

## 2015-03-16 LAB — MAGNESIUM: Magnesium: 2.7 mg/dL — ABNORMAL HIGH (ref 1.5–2.5)

## 2015-03-16 LAB — GLUCOSE, CAPILLARY
GLUCOSE-CAPILLARY: 172 mg/dL — AB (ref 70–99)
GLUCOSE-CAPILLARY: 175 mg/dL — AB (ref 70–99)
GLUCOSE-CAPILLARY: 264 mg/dL — AB (ref 70–99)
Glucose-Capillary: 180 mg/dL — ABNORMAL HIGH (ref 70–99)
Glucose-Capillary: 258 mg/dL — ABNORMAL HIGH (ref 70–99)
Glucose-Capillary: 240 mg/dL — ABNORMAL HIGH (ref 70–99)

## 2015-03-16 MED ORDER — DEXTROSE 5 % IV SOLN
30.0000 ug/min | INTRAVENOUS | Status: DC
  Administered 2015-03-16: 30 ug/min via INTRAVENOUS
  Administered 2015-03-16: 150 ug/min via INTRAVENOUS
  Administered 2015-03-16: 200 ug/min via INTRAVENOUS
  Administered 2015-03-16: 120 ug/min via INTRAVENOUS
  Administered 2015-03-16 (×2): 150 ug/min via INTRAVENOUS
  Filled 2015-03-16 (×6): qty 1

## 2015-03-16 MED ORDER — INSULIN GLARGINE 100 UNIT/ML ~~LOC~~ SOLN
15.0000 [IU] | Freq: Every day | SUBCUTANEOUS | Status: DC
  Administered 2015-03-17: 15 [IU] via SUBCUTANEOUS
  Filled 2015-03-16 (×2): qty 0.15

## 2015-03-16 MED ORDER — PHENYLEPHRINE HCL 10 MG/ML IJ SOLN
30.0000 ug/min | INTRAVENOUS | Status: DC
  Administered 2015-03-16: 120 ug/min via INTRAVENOUS
  Administered 2015-03-17: 150 ug/min via INTRAVENOUS
  Administered 2015-03-17: 140 ug/min via INTRAVENOUS
  Administered 2015-03-17 – 2015-03-18 (×5): 200 ug/min via INTRAVENOUS
  Filled 2015-03-16 (×13): qty 4

## 2015-03-16 NOTE — Progress Notes (Signed)
CVVHD management.  On 2 pressors with SBP 90's.  Metabolically stable. Keeping even.  On heparin gtt.  All 4K.

## 2015-03-16 NOTE — Progress Notes (Signed)
Name: Russell [name] MRN: [id number] DOB: [birth year]    ADMISSION DATE:  02/22/2015 CONSULTATION DATE:  3/28  REFERRING MD :  Roxan Hockey   CHIEF COMPLAINT:  Pulmonary infiltrates, hypoxia   BRIEF PATIENT DESCRIPTION: 69yo male with hx Afib, HTN, newly dx non-small cell lung ca, s/p recent LLLobectomy 3/15, pos LNs.  He was d/c post op and returned 3/23 to the office with progressive SOB & BL infx.  In office his sats were in 70's on RA and he was tx to [facility].  He was admitted by CVTS with ?HCAP v pulm edema.  He was treated with IV abx and lasix but remained SOB and CT revealed diffuse ground glass opacities, amiodarone was stopped and PCCM consulted for further recs.   SIGNIFICANT EVENTS  CTA chest 3/27>>> NEG PE, Interval development of diffuse bilateral predominately ground-glass and associated consolidative pulmonary opacities which may be secondary to multi focal infection or potentially an inflammatory process such as drug reaction. Hemorrhage could have a similar appearance. 3/29 tx to ICU increased FIO2 needs 3/31 intubated-ARDS protocol 4/1 BL pnthx- BL chest tubes  4/1 CRRT 4/3 Large R sided air leak on sahara. TCTS eval and placed 2nd CT on R. Placing CTs to suction caused patient to deteriorate. Both left to waterseal.   STUDIES:  2D echo 3/25>>>EF 50-55%, trivial pericardiac effusion, small L pleural effusion, trivial MR, mild Pulm regurg  SUBJECTIVE: ARDS protocol, deeply sedated & paralyzed, remains on pressors, on CRRT. 2nd CT placed on R by TCTS 4/3. Both to waterseal.   VITAL SIGNS: Temp:  [95.2 F (35.1 C)-98.6 F (37 C)] 98.1 F (36.7 C) (04/04 1100) Pulse Rate:  [25-114] 86 (04/04 1100) Resp:  [24-31] 24 (04/04 1100) BP: (76-138)/(38-81) 89/50 mmHg (04/04 1100) SpO2:  [80 %-97 %] 80 % (04/04 1100) FiO2 (%):  [50 %-100 %] 100 % (04/04 1100) Weight:  [82.9 kg (182 lb 12.2 oz)] 82.9 kg (182 lb 12.2 oz) (04/04 0530)  PHYSICAL EXAMINATION: General:   Pleasant, chronically ill appearing male,  Neuro:sedated , paralysed , BIS 35 HEENT:  Mm dry, no JVD, neck crepitus +  Cardiovascular:  s1s2 irreg  Lungs:  diminished bases otherwise fairly clear, L chest dressing c/d ,large air leak on right, non eon left  Abdomen:  Soft, nt, nd, +bs  Musculoskeletal:  Warm and dry, no edema   Recent Labs Lab 03/15/15 0345 03/15/15 1816 03/16/15 0425  NA 139 136 138  K 4.5 4.3 4.9  CL 98 98 101  CO2 26 27 26   BUN 50* 51* 52*  CREATININE 2.85* 2.78* 2.70*  GLUCOSE 196* 268* 194*    Recent Labs Lab 03/14/15 0328 03/15/15 0345 03/16/15 0425  HGB 11.5* 11.5* 11.8*  HCT 37.1* 35.6* 37.4*  WBC 24.2* 27.2* 35.1*  PLT 316 296 347   Dg Chest Port 1 View  03/16/2015   CLINICAL DATA:  Chest tube placement.  EXAM: PORTABLE CHEST - 1 VIEW  COMPARISON:  03/15/2015.  FINDINGS: Endotracheal tube, left IJ line, NG tube in stable position. Bilateral chest tubes in stable position. Interval placement of a second right chest tube. This chest tube is in good anatomic position. Persistent right pneumothorax. No interim change. Persistent bibasilar atelectasis and/or infiltrates. Surgical clips in the left hilar region. Stable cardiomegaly with normal pulmonary vascularity.  IMPRESSION: 1. Interim placement of a second right chest tube. There is persistent unchanged right pneumothorax. 2. Remaining lines and tubes in stable position. 3. Persistent bibasilar atelectasis  and/or infiltrates. 4. Stable cardiomegaly.   Electronically Signed   By: Marcello Moores  Register   On: 03/16/2015 07:20   Dg Chest Port 1 View  03/15/2015   CLINICAL DATA:  Right-sided pneumothorax  EXAM: PORTABLE CHEST - 1 VIEW  COMPARISON:  03/15/2015 at 12:36 p.m.  FINDINGS: Support apparatus is stable. Increased, now moderate, right pneumothorax is identified. Minimal leftward midline shift may indicate tension.  IMPRESSION: Increased, now moderate, right pneumothorax, with possible tension. Critical  Value/emergent results were called by telephone at the time of interpretation on 03/15/2015 at 3:58 pm to [facility] for Dr. Kara Mead , who verbally acknowledged these results.   Electronically Signed   By: Conchita Paris M.D.   On: 03/15/2015 16:01   Dg Chest Port 1 View  03/15/2015   CLINICAL DATA:  Right lung carcinoma.  Postop.  Right pneumothorax.  EXAM: PORTABLE CHEST - 1 VIEW  COMPARISON:  03/15/2015  FINDINGS: Support lines and tubes in appropriate position. Bilateral chest tubes remain in place. There is a small approximately 10% right basilar pneumothorax which is decreased in size since previous study. No definite left pneumothorax visualized  Bilateral lower lung airspace disease is again seen, left-sided greater than right, and without significant change. Probable left pleural effusion also without change. Heart size is stable.  IMPRESSION: Small approximately 10% right basilar pneumothorax appears decreased in size bilateral chest tubes remain in place.  No significant change in bibasilar airspace disease and probable left pleural effusion.   Electronically Signed   By: Earle Gell M.D.   On: 03/15/2015 14:06   Dg Chest Port 1 View  03/15/2015   CLINICAL DATA:  adult respiratory distress syndrome. Hypertension. Left lung cancer.  EXAM: PORTABLE CHEST - 1 VIEW  COMPARISON:  One day prior  FINDINGS: Numerous leads and wires project over the chest. Endotracheal tube terminates 4.3 cm above carina. Right IJ catheter terminates the mid SVC. Left IJ catheter terminates at low SVC. Nasogastric extends beyond the inferior aspect of the film. Bilateral chest tubes.  Cardiomegaly accentuated by AP portable technique. Atherosclerosis in the transverse aorta. Similar left pleural effusion. Approximately 20% inferior lateral right pneumothorax is identified. Similar to on the 03/13/2015 exam. Mild interstitial edema. Bibasilar airspace disease. Subcutaneous emphysema about the right supraclavicular region is  decreased.  IMPRESSION: No change in aeration, with similar interstitial edema, left pleural effusion, and bibasilar airspace disease.  Bilateral chest tubes remain in place. Approximately 20% inferior lateral right-sided pneumothorax.   Electronically Signed   By: Abigail Miyamoto M.D.   On: 03/15/2015 09:31   CXR 4/4 >  persistent R PTX despite 2 chest tubes.   ASSESSMENT / PLAN:  PULMONARY OETT 3/31 >> A: ARDS - multifactorial in setting recent LLLobectomy for NSCLCA, HCAP -treated with abx, concern for amiodarone toxicity given CT appearance. Amiodarone stopped 3/27.  Barotrauma - BL pneumothoraces & sub cut emphysema 4/1 P:   D/C ARDS protocol to keep PEEP at 5 cc/kg  Chest tubes per TCTS - currently to suction, may need to go to water seal on R side with any deterioration. Plan was to DC nimbex 4/4, however, still needs this. Continue Solumedrol 80 mg IV q8, decreased 4/3   CARDIOVASCULAR CVL 3/31 >> A:  Septic shock Paroxysmal AF Elevated trop - peak 1.9 New cardiomyopathy -Echo - EF 25 %, akinetic septum & inferior wall, no sig effusion P:  Levophed gtt > transition to phenylephrine as now back in AF RVR Continue vaso gtt  Hold pradaxa -  heparin IV  Cards input, ASA daily  RENAL A:   AKI  Hyperkalemia Hyperphosphatemia  Acute resp acidosis -resolved P:   CRRT - per Renal   GASTROINTESTINAL A:   Shock liver Protein cal malnutrition P:   Keep TFs @ 20/h IV PPI Follow LFT  HEMATOLOGIC A:   No issues P:  Monitor on IV heparin  INFECTIOUS A:   HCAP Worsening leukocytosis 4/4 P:   Sputum 4/1 >> Abx: zosyn /vanc 3/23 >> 4/1 Meropenem 4/2 >> Vancomycin 4/2 >> Follow RVP>  ENDOCRINE A:  Hyperglycemia , related to stress, steroids  P:   SSI Lantus to 15, lowered steroids  NEUROLOGIC A:   Acute encephalopathy P:   RASS goal: n/a on nimbex for ARDS protocol Versed/fent gtt  FAMILY  - Updates: wife in detail 4/2  - Inter-disciplinary family meet  or Palliative Care meeting due by:  4/7  Georgann Housekeeper, AGACNP-BC [place] [facility] Pager [phone] or [phone]  03/16/2015 11:57 AM   PCCM ATTENDING: I have reviewed pt's initial presentation, consultants notes and hospital database in detail.  The above assessment and plan was formulated under my direction.  In summary: Recent LLL lobectomy ARDS of unclear etiology Severe R sided BPF VDRF  The prognosis here is very guarded. There seem to be no good options for the R PTX and anything that we try will be a last ditch, desperation measure - discussed with Dr Roxan Hockey. Cont full vent support - settings reviewed and/or adjusted. Cont vent bundle Daily SBT if/when meets criteria. He continues to require vasopressors. He has recurrent AFRVR - will transition from NE to PE and continue vasopressin. Cont CRRT per Renal. We need to start to address advanced directives. His prognosis would be very poor if he were to suffer cardiac arrest and undergo ACLS/CPR    Merton Border, MD;  PCCM service; Mobile [phone]

## 2015-03-16 NOTE — Progress Notes (Signed)
ANTICOAGULATION & ANTIBIOTICS CONSULT NOTE - Follow Up  Pharmacy Consult for Heparin Indication: ACS/Afib  Allergies  Allergen Reactions  . Amiodarone Shortness Of Breath   Patient Measurements: Height: 5\' 6"  (167.6 cm) Weight: 182 lb 12.2 oz (82.9 kg) IBW/kg (Calculated) : 63.8 Heparin Dosing Weight: 81.6  Vital Signs: Temp: 96.8 F (36 C) (04/04 1800) BP: 89/51 mmHg (04/04 1800) Pulse Rate: 74 (04/04 1800)  Labs:  Recent Labs  03/14/15 0328 03/14/15 0830  03/15/15 0345  03/15/15 1816 03/15/15 2303 03/16/15 0425 03/16/15 0800 03/16/15 1600 03/16/15 1715  HGB 11.5*  --   --  11.5*  --   --   --  11.8*  --   --   --   HCT 37.1*  --   --  35.6*  --   --   --  37.4*  --   --   --   PLT 316  --   --  296  --   --   --  347  --   --   --   APTT  --  35  --   --   --   --   --   --   --   --   --   LABPROT  --  25.0*  --   --   --   --   --   --   --   --   --   INR  --  2.24*  --   --   --   --   --   --   --   --   --   HEPARINUNFRC  --   --   < > <0.10*  < >  --  0.27*  --  0.54  --  0.58  CREATININE 2.90*  --   < > 2.85*  --  2.78*  --  2.70*  --  2.72*  --   TROPONINI 0.80*  --   --   --   --   --   --   --   --   --   --   < > = values in this interval not displayed. Estimated Creatinine Clearance: 25.5 mL/min (by C-G formula based on Cr of 2.72).  Medical History: Past Medical History  Diagnosis Date  . Hyperlipidemia   . Depression   . Hypertension   . AAA (abdominal aortic aneurysm)     a. 01/23/15 CT: Infrarenal AAA  - 4.4cm. Monitoted by dr early  . Left ureteral calculus   . Bladder stone   . Prostate cancer     a. T2a N0 M0 Gleason 8 prostate cancer, biopsy w/ 10/12 cores positve including 2 w/ Gleason  & remainder both 7 (4+3) & 6;  b. PSA 5.5;  c. CT and PET w/o evidence for prostate Ca mets.  . Frequency of urination   . Nocturia   . BPH (benign prostatic hypertrophy)   . History of kidney stones   . Arthritis   . Wears glasses   . At risk for  sleep apnea     STOP-BANG= 5 SENT TO PCP 02-17-2015  . Cancer of left lung     a. 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 Post-op course complicated by bleeding (hemothorax req chest tube) and afib.  . Atrial fibrillation     a. 02/2015 noted post-op lobectomy;  b. CHA2DS2VASc = 3-->placed on pradaxa (chosen b/c of reversal available and potential for  repeat surgeries).  . Multifocal atrial tachycardia    Medications:  Infusions:  . cisatracurium (NIMBEX) infusion Stopped (03/15/15 1900)  . feeding supplement (VITAL HIGH PROTEIN) 1,000 mL (03/15/15 2007)  . fentaNYL infusion INTRAVENOUS 200 mcg/hr (03/16/15 1313)  . heparin 10,000 units/ 20 mL infusion syringe    . heparin 1,850 Units/hr (03/16/15 1434)  . midazolam (VERSED) infusion 3 mg/hr (03/16/15 0600)  . norepinephrine (LEVOPHED) Adult infusion Stopped (03/16/15 1620)  . phenylephrine (NEO-SYNEPHRINE) Adult infusion 150 mcg/min (03/16/15 1856)  . dialysis replacement fluid (prismasate) 400 mL/hr at 03/16/15 1843  . dialysis replacement fluid (prismasate) 200 mL/hr at 03/16/15 1843  . dialysate (PRISMASATE) 1,000 mL/hr at 03/16/15 0448  . vasopressin (PITRESSIN) infusion - *FOR SHOCK* 0.03 Units/min (03/16/15 0600)   Assessment: 69yo male admitted with hx. Afib and recently diagnosed with NSCLC.  He was on Pradaxa prior to admit.  He was started on IV heparin yesterday for possible new MI.    Heparin level remains therapeutic (0.58) at current rate of 1850 units/hr. CBC stable. No bleeding reported.   Goal of Therapy:  Heparin level 0.3-0.7 units/mL Monitor platelets by anticoagulation protocol: Yes   Plan:  Continue IV heparin to 1850 units/hr Daily HL/CBC Monitor s/sx of bleeding  Maryanna Shape, PharmD, BCPS  Clinical Pharmacist  Pager: [phone]   03/16/2015,7:07 PM

## 2015-03-16 NOTE — Progress Notes (Signed)
UR Completed.  [phone]  

## 2015-03-16 NOTE — Progress Notes (Signed)
Pt became agitated during PCXR @ 0550 , DR Nelda Marseille called to look at X-ray. Order received to place Right anterior distal  chest tube to suction.  Fentanyl increased to 200 mcq/hr and versed increased to 3 mg/hr for agitation. Pt resting quietly at this time with O2 sats 91-93%.

## 2015-03-16 NOTE — Progress Notes (Signed)
[facility] Progress Note Patient Name: Russell [name] DOB: Sep 06, 1946 MRN: [id number]   Date of Service  03/16/2015  HPI/Events of Note  Ongoing issues with hypoxia and acidosis with pH 7.24/64.8/56/28 on vent rate of 30.  With bilateral PTX goal is to keep peep at 5.  eICU Interventions  Plan: 1. Increase vent rate to 35 2. Recheck ABG in 1 hour 3. Consider paralytic     Intervention Category Major Interventions: Acid-Base disturbance - evaluation and management;Respiratory failure - evaluation and management  Pam Vanalstine 03/16/2015, 11:46 PM

## 2015-03-16 NOTE — Progress Notes (Addendum)
ANTICOAGULATION & ANTIBIOTICS CONSULT NOTE - Follow Up  Pharmacy Consult for Heparin; Vancomycin, Merrem Indication: ACS/Afib; r/o pneumonia/sepsis  Allergies  Allergen Reactions  . Amiodarone Shortness Of Breath   Patient Measurements: Height: 5\' 6"  (167.6 cm) Weight: 182 lb 12.2 oz (82.9 kg) IBW/kg (Calculated) : 63.8 Heparin Dosing Weight: 81.6  Vital Signs: Temp: 98 F (36.7 C) (04/04 0700) Temp Source: Core (Comment) (04/04 0700) BP: 123/59 mmHg (04/04 0853) Pulse Rate: 76 (04/04 0853)  Labs:  Recent Labs  03/13/15 1230  03/14/15 0328 03/14/15 0830  03/15/15 0345 03/15/15 1430 03/15/15 1816 03/15/15 2303 03/16/15 0425 03/16/15 0800  HGB  --   < > 11.5*  --   --  11.5*  --   --   --  11.8*  --   HCT  --   --  37.1*  --   --  35.6*  --   --   --  37.4*  --   PLT  --   --  316  --   --  296  --   --   --  347  --   APTT  --   --   --  35  --   --   --   --   --   --   --   LABPROT  --   --   --  25.0*  --   --   --   --   --   --   --   INR  --   --   --  2.24*  --   --   --   --   --   --   --   HEPARINUNFRC  --   --   --   --   < > <0.10* 0.14*  --  0.27*  --  0.54  CREATININE 2.91*  < > 2.90*  --   < > 2.85*  --  2.78*  --  2.70*  --   TROPONINI 1.97*  --  0.80*  --   --   --   --   --   --   --   --   < > = values in this interval not displayed. Estimated Creatinine Clearance: 25.7 mL/min (by C-G formula based on Cr of 2.7).  Medical History: Past Medical History  Diagnosis Date  . Hyperlipidemia   . Depression   . Hypertension   . AAA (abdominal aortic aneurysm)     a. 01/23/15 CT: Infrarenal AAA  - 4.4cm. Monitoted by dr early  . Left ureteral calculus   . Bladder stone   . Prostate cancer     a. T2a N0 M0 Gleason 8 prostate cancer, biopsy w/ 10/12 cores positve including 2 w/ Gleason  & remainder both 7 (4+3) & 6;  b. PSA 5.5;  c. CT and PET w/o evidence for prostate Ca mets.  . Frequency of urination   . Nocturia   . BPH (benign prostatic  hypertrophy)   . History of kidney stones   . Arthritis   . Wears glasses   . At risk for sleep apnea     STOP-BANG= 5 SENT TO PCP 02-17-2015  . Cancer of left lung     a. 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 Post-op course complicated by bleeding (hemothorax req chest tube) and afib.  . Atrial fibrillation     a. 02/2015 noted post-op lobectomy;  b. CHA2DS2VASc = 3-->placed on pradaxa (  chosen b/c of reversal available and potential for repeat surgeries).  . Multifocal atrial tachycardia    Medications:  Infusions:  . cisatracurium (NIMBEX) infusion Stopped (03/15/15 1900)  . feeding supplement (VITAL HIGH PROTEIN) 1,000 mL (03/15/15 2007)  . fentaNYL infusion INTRAVENOUS 200 mcg/hr (03/16/15 0700)  . heparin 10,000 units/ 20 mL infusion syringe    . heparin 1,850 Units/hr (03/16/15 0200)  . midazolam (VERSED) infusion 3 mg/hr (03/16/15 0600)  . norepinephrine (LEVOPHED) Adult infusion 22 mcg/min (03/16/15 0600)  . dialysis replacement fluid (prismasate) 400 mL/hr at 03/16/15 0953  . dialysis replacement fluid (prismasate) 200 mL/hr at 03/15/15 2308  . dialysate (PRISMASATE) 1,000 mL/hr at 03/16/15 0448  . vasopressin (PITRESSIN) infusion - *FOR SHOCK* 0.03 Units/min (03/16/15 0600)   Assessment: 69yo male admitted with hx. Afib and recently diagnosed with NSCLC.  He was on Pradaxa prior to admit.  He was started on IV heparin yesterday for possible new MI.    Heparin level is therapeutic (0.54) at current rate of 1850 units/hr. CBC stable. No bleeding reported.   ID: WBC up to 35.1, CRRT continues for now. Afebrile. Cultures negative to date.   Goal of Therapy:  Heparin level 0.3-0.7 units/mL Monitor platelets by anticoagulation protocol: Yes   Plan:  Continue IV heparin to 1850 units/hr 8h HL to confirm Daily HL/CBC Monitor s/sx of bleeding Continue Vancomycin 10mg /kg (750)  IV every 24 hrs while on CRRT >> consider increase to 1g.  Will check vancomycin level  4/5 to help guide dosing. Continue Merrem 1g IV every 12 hours while on CRRT.   Sloan Leiter, PharmD, BCPS Clinical Pharmacist [phone] 03/16/2015,10:53 AM

## 2015-03-16 NOTE — Progress Notes (Signed)
  Subjective: Intubated, sedated  Objective: Vital signs in last 24 hours: Temp:  [95.2 F (35.1 C)-98.6 F (37 C)] 98 F (36.7 C) (04/04 0700) Pulse Rate:  [25-114] 76 (04/04 0853) Cardiac Rhythm:  [-] Normal sinus rhythm (04/04 0700) Resp:  [24-34] 30 (04/04 0853) BP: (76-138)/(38-81) 123/59 mmHg (04/04 0853) SpO2:  [83 %-97 %] 91 % (04/04 0853) FiO2 (%):  [50 %-100 %] 100 % (04/04 0854) Weight:  [182 lb 12.2 oz (82.9 kg)] 182 lb 12.2 oz (82.9 kg) (04/04 0530)  Hemodynamic parameters for last 24 hours: CVP:  [6 mmHg-8 mmHg] 8 mmHg  Intake/Output from previous day: 04/03 0701 - 04/04 0700 In: 3095.1 [I.V.:1890.1; NG/GT:620; IV Piggyback:350] Out: 3715 [Urine:35; Chest Tube:270] Intake/Output this shift: Total I/O In: -  Out: 96 [Other:96]  General appearance: sedated Heart: regular rate and rhythm Lungs: rhonchi bilaterally + air leak bilaterally, large on R, smaller on L  Lab Results:  Recent Labs  03/15/15 0345 03/16/15 0425  WBC 27.2* 35.1*  HGB 11.5* 11.8*  HCT 35.6* 37.4*  PLT 296 347   BMET:  Recent Labs  03/15/15 1816 03/16/15 0425  NA 136 138  K 4.3 4.9  CL 98 101  CO2 27 26  GLUCOSE 268* 194*  BUN 51* 52*  CREATININE 2.78* 2.70*  CALCIUM 7.5* 7.7*    PT/INR:  Recent Labs  03/14/15 0830  LABPROT 25.0*  INR 2.24*   ABG    Component Value Date/Time   PHART 7.299* 03/16/2015 0434   HCO3 27.8* 03/16/2015 0434   TCO2 30 03/16/2015 0434   ACIDBASEDEF 10.0* 03/14/2015 1115   O2SAT 95.0 03/16/2015 0434   CBG (last 3)   Recent Labs  03/15/15 1951 03/15/15 2342 03/16/15 0411  GLUCAP 245* 172* 180*    Assessment/Plan: S/P   remains critically ill  His right lung is down with RLL atelectasis He did poorly with right CT to suction yesterday, but will try suction this AM to see if we can get the lung to re-expand Picture c/w overwhelming sepsis/ ARDS/ multiple organ system failure- respiratory, renal, hepatic Discussed with Dr.  Alva Garnet   LOS: 12 days    Melrose Nakayama 03/16/2015

## 2015-03-17 ENCOUNTER — Encounter (HOSPITAL_COMMUNITY)
Admission: AD | Disposition: E | Payer: Medicare Other | Source: Ambulatory Visit | Attending: Thoracic Surgery (Cardiothoracic Vascular Surgery)

## 2015-03-17 ENCOUNTER — Inpatient Hospital Stay (HOSPITAL_COMMUNITY): Payer: Medicare Other

## 2015-03-17 DIAGNOSIS — J80 Acute respiratory distress syndrome: Secondary | ICD-10-CM

## 2015-03-17 HISTORY — PX: INSERTION OF IBV VALVE: SHX5091

## 2015-03-17 LAB — BODY FLUID CULTURE: CULTURE: NO GROWTH

## 2015-03-17 LAB — CBC
HCT: 36.9 % — ABNORMAL LOW (ref 39.0–52.0)
HCT: 36.1 % — ABNORMAL LOW (ref 39.0–52.0)
Hemoglobin: 11.3 g/dL — ABNORMAL LOW (ref 13.0–17.0)
Hemoglobin: 11.2 g/dL — ABNORMAL LOW (ref 13.0–17.0)
MCH: 28.5 pg (ref 26.0–34.0)
MCH: 28.3 pg (ref 26.0–34.0)
MCHC: 30.6 g/dL (ref 30.0–36.0)
MCHC: 31 g/dL (ref 30.0–36.0)
MCV: 93.2 fL (ref 78.0–100.0)
MCV: 91.2 fL (ref 78.0–100.0)
PLATELETS: 296 10*3/uL (ref 150–400)
Platelets: 341 10*3/uL (ref 150–400)
RBC: 3.96 MIL/uL — ABNORMAL LOW (ref 4.22–5.81)
RBC: 3.96 MIL/uL — AB (ref 4.22–5.81)
RDW: 14.5 % (ref 11.5–15.5)
RDW: 14.8 % (ref 11.5–15.5)
WBC: 39.7 10*3/uL — ABNORMAL HIGH (ref 4.0–10.5)
WBC: 37.6 10*3/uL — AB (ref 4.0–10.5)

## 2015-03-17 LAB — POCT I-STAT 3, ART BLOOD GAS (G3+)
ACID-BASE DEFICIT: 1 mmol/L (ref 0.0–2.0)
Acid-base deficit: 3 mmol/L — ABNORMAL HIGH (ref 0.0–2.0)
Bicarbonate: 25.8 mEq/L — ABNORMAL HIGH (ref 20.0–24.0)
Bicarbonate: 27.3 mEq/L — ABNORMAL HIGH (ref 20.0–24.0)
O2 SAT: 86 %
O2 SAT: 78 %
PO2 ART: 60 mmHg — AB (ref 80.0–100.0)
Patient temperature: 97.6
TCO2: 28 mmol/L (ref 0–100)
TCO2: 29 mmol/L (ref 0–100)
pCO2 arterial: 58.8 mmHg (ref 35.0–45.0)
pCO2 arterial: 54.4 mmHg — ABNORMAL HIGH (ref 35.0–45.0)
pH, Arterial: 7.247 — ABNORMAL LOW (ref 7.350–7.450)
pH, Arterial: 7.301 — ABNORMAL LOW (ref 7.350–7.450)
pO2, Arterial: 43 mmHg — ABNORMAL LOW (ref 80.0–100.0)

## 2015-03-17 LAB — GLUCOSE, CAPILLARY
GLUCOSE-CAPILLARY: 189 mg/dL — AB (ref 70–99)
GLUCOSE-CAPILLARY: 196 mg/dL — AB (ref 70–99)
Glucose-Capillary: 242 mg/dL — ABNORMAL HIGH (ref 70–99)
Glucose-Capillary: 159 mg/dL — ABNORMAL HIGH (ref 70–99)
Glucose-Capillary: 165 mg/dL — ABNORMAL HIGH (ref 70–99)
Glucose-Capillary: 203 mg/dL — ABNORMAL HIGH (ref 70–99)
Glucose-Capillary: 137 mg/dL — ABNORMAL HIGH (ref 70–99)

## 2015-03-17 LAB — RENAL FUNCTION PANEL
ALBUMIN: 1.9 g/dL — AB (ref 3.5–5.2)
ANION GAP: 6 (ref 5–15)
Albumin: 1.9 g/dL — ABNORMAL LOW (ref 3.5–5.2)
Anion gap: 11 (ref 5–15)
BUN: 48 mg/dL — AB (ref 6–23)
BUN: 45 mg/dL — ABNORMAL HIGH (ref 6–23)
CALCIUM: 7.4 mg/dL — AB (ref 8.4–10.5)
CO2: 27 mmol/L (ref 19–32)
CO2: 22 mmol/L (ref 19–32)
CREATININE: 2.59 mg/dL — AB (ref 0.50–1.35)
Calcium: 7.3 mg/dL — ABNORMAL LOW (ref 8.4–10.5)
Chloride: 99 mmol/L (ref 96–112)
Chloride: 101 mmol/L (ref 96–112)
Creatinine, Ser: 2.42 mg/dL — ABNORMAL HIGH (ref 0.50–1.35)
GFR calc Af Amer: 30 mL/min — ABNORMAL LOW (ref >90)
GFR calc non Af Amer: 25 mL/min — ABNORMAL LOW (ref >90)
GFR calc non Af Amer: 23 mL/min — ABNORMAL LOW (ref >90)
GFR, EST AFRICAN AMERICAN: 27 mL/min — AB (ref >90)
GLUCOSE: 181 mg/dL — AB (ref 70–99)
Glucose, Bld: 179 mg/dL — ABNORMAL HIGH (ref 70–99)
PHOSPHORUS: 4 mg/dL (ref 2.3–4.6)
PHOSPHORUS: 4.8 mg/dL — AB (ref 2.3–4.6)
POTASSIUM: 5.3 mmol/L — AB (ref 3.5–5.1)
POTASSIUM: 5.5 mmol/L — AB (ref 3.5–5.1)
Sodium: 132 mmol/L — ABNORMAL LOW (ref 135–145)
Sodium: 134 mmol/L — ABNORMAL LOW (ref 135–145)

## 2015-03-17 LAB — MAGNESIUM: Magnesium: 2.4 mg/dL (ref 1.5–2.5)

## 2015-03-17 LAB — VANCOMYCIN, TROUGH: Vancomycin Tr: 18.3 ug/mL (ref 10.0–20.0)

## 2015-03-17 SURGERY — INSERTION, INTRABRONCHIAL VALVE, SPIRATION
Anesthesia: General

## 2015-03-17 MED ORDER — SODIUM CHLORIDE 0.9 % IV SOLN: 1.0000 mg/h | INTRAVENOUS | Status: DC

## 2015-03-17 MED ORDER — PRISMASOL BGK 0/2.5 32-2.5 MEQ/L IV SOLN
INTRAVENOUS | Status: DC
  Administered 2015-03-17 – 2015-03-18 (×2): via INTRAVENOUS_CENTRAL
  Filled 2015-03-17 (×3): qty 5000

## 2015-03-17 MED ORDER — FENTANYL BOLUS VIA INFUSION
50.0000 ug | INTRAVENOUS | Status: DC | PRN
Start: 2015-03-17 — End: 2015-03-17

## 2015-03-17 MED ORDER — PRISMASOL BGK 0/2.5 32-2.5 MEQ/L IV SOLN
INTRAVENOUS | Status: DC
  Administered 2015-03-17: 20:00:00 via INTRAVENOUS_CENTRAL
  Filled 2015-03-17 (×2): qty 5000

## 2015-03-17 MED ORDER — 0.9 % SODIUM CHLORIDE (POUR BTL) OPTIME
TOPICAL | Status: DC | PRN
  Administered 2015-03-17: 1000 mL

## 2015-03-17 MED ORDER — FENTANYL CITRATE 0.05 MG/ML IJ SOLN: 100.0000 ug | Freq: Once | INTRAMUSCULAR | Status: DC | PRN

## 2015-03-17 MED ORDER — MINERAL OIL LIGHT 100 % EX OIL
TOPICAL_OIL | CUTANEOUS | Status: AC
  Filled 2015-03-17: qty 25

## 2015-03-17 MED ORDER — MIDAZOLAM BOLUS VIA INFUSION: 2.0000 mg | INTRAVENOUS | Status: DC | PRN

## 2015-03-17 MED ORDER — VECURONIUM BROMIDE 10 MG IV SOLR: 0.8000 ug/kg/min | INTRAVENOUS | Status: DC

## 2015-03-17 MED ORDER — MIDAZOLAM HCL 5 MG/ML IJ SOLN: 2.0000 mg | Freq: Once | INTRAMUSCULAR | Status: DC

## 2015-03-17 MED ORDER — SODIUM CHLORIDE 0.9 % IV SOLN: 25.0000 ug/h | INTRAVENOUS | Status: DC

## 2015-03-17 MED ORDER — FENTANYL CITRATE 0.05 MG/ML IJ SOLN: 100.0000 ug | Freq: Once | INTRAMUSCULAR | Status: DC

## 2015-03-17 MED ORDER — ARTIFICIAL TEARS OP OINT: 1.0000 "application " | TOPICAL_OINTMENT | Freq: Three times a day (TID) | OPHTHALMIC | Status: DC

## 2015-03-17 MED ORDER — VITAL HIGH PROTEIN PO LIQD
1000.0000 mL | ORAL | Status: DC
  Administered 2015-03-17: 1000 mL
  Filled 2015-03-17 (×2): qty 1000

## 2015-03-17 MED ORDER — VECURONIUM BOLUS VIA INFUSION: 10.0000 mg | Freq: Once | INTRAVENOUS | Status: DC

## 2015-03-17 MED ORDER — MIDAZOLAM HCL 5 MG/ML IJ SOLN: 2.0000 mg | Freq: Once | INTRAMUSCULAR | Status: DC | PRN

## 2015-03-17 SURGICAL SUPPLY — 25 items
CANISTER SUCTION 2500CC (MISCELLANEOUS) ×3 IMPLANT
CATH EMB 5FR 80CM (CATHETERS) ×2 IMPLANT
CONT SPEC 4OZ CLIKSEAL STRL BL (MISCELLANEOUS) ×3 IMPLANT
COVER TABLE BACK 60X90 (DRAPES) ×3 IMPLANT
FILTER STRAW FLUID ASPIR (MISCELLANEOUS) IMPLANT
FORCEPS BIOP RJ4 1.8 (CUTTING FORCEPS) ×2 IMPLANT
GAUZE SPONGE 4X4 12PLY STRL (GAUZE/BANDAGES/DRESSINGS) ×3 IMPLANT
GLOVE SURG SIGNA 7.5 PF LTX (GLOVE) ×3 IMPLANT
GLOVE SURG SS PI 6.5 STRL IVOR (GLOVE) ×4 IMPLANT
GOWN STRL REUS W/ TWL XL LVL3 (GOWN DISPOSABLE) ×1 IMPLANT
GOWN STRL REUS W/TWL XL LVL3 (GOWN DISPOSABLE) ×6
KIT CLEAN ENDO COMPLIANCE (KITS) ×3 IMPLANT
KIT ROOM TURNOVER OR (KITS) ×1 IMPLANT
MARKER SKIN DUAL TIP RULER LAB (MISCELLANEOUS) ×3 IMPLANT
NS IRRIG 1000ML POUR BTL (IV SOLUTION) ×3 IMPLANT
OIL SILICONE PENTAX (PARTS (SERVICE/REPAIRS)) ×2 IMPLANT
PAD ARMBOARD 7.5X6 YLW CONV (MISCELLANEOUS) ×2 IMPLANT
STOPCOCK MORSE 400PSI 3WAY (MISCELLANEOUS) ×3 IMPLANT
SYR 20ML ECCENTRIC (SYRINGE) ×5 IMPLANT
SYRINGE 10CC LL (SYRINGE) ×3 IMPLANT
TOWEL NATURAL 4PK STERILE (DISPOSABLE) ×3 IMPLANT
TRAP SPECIMEN MUCOUS 40CC (MISCELLANEOUS) ×3 IMPLANT
TUBE CONNECTING 20'X1/4 (TUBING) ×1
TUBE CONNECTING 20X1/4 (TUBING) ×2 IMPLANT
VALVE IN CARTRIDGE 7MM HUD (Valve) ×8 IMPLANT

## 2015-03-17 NOTE — Procedures (Signed)
Intubation Procedure Note Russell [name] [id number] 12/23/1945  Procedure: Intubation Indications: Airway protection and maintenance  Procedure Details Consent: Risks of procedure as well as the alternatives and risks of each were explained to the (patient/caregiver).  Consent for procedure obtained. Time Out: Verified patient identification, verified procedure, site/side was marked, verified correct patient position, special equipment/implants available, medications/allergies/relevent history reviewed, required imaging and test results available.  Performed  Patient with persistent air leak from pneumothorax needing intervention.  He had 7.5 ETT in place, but needed larger ETT for procedure.  Visualized vocal cords and 7.5 ETT with glidescope.  Inserted bougie through 7.5 ETT.  Then under direct visualization removed 7.5 ETT and inserted 8.5 ETT over bougie to 25 cm at lip.  Good color change with CO2 detector and good tidal volume on vent.  Russell Mires, MD [facility] Pulmonary/Critical Care 04/04/2015, 2:46 PM Pager:  [phone] After 3pm call: [phone]

## 2015-03-17 NOTE — Progress Notes (Signed)
Pt sats still persistently low 80s.  CT placed back on -10sx per Rahul, PA.  Sats improved.  Will monitor pt.

## 2015-03-17 NOTE — Progress Notes (Signed)
  Subjective: Intubated, sedated Became more unstable overnight, decreased sats, hypotension requiring significant increase in pressor support  Objective: Vital signs in last 24 hours: Temp:  [95.4 F (35.2 C)-98.1 F (36.7 C)] 96.5 F (35.8 C) (04/05 0730) Pulse Rate:  [25-101] 82 (04/05 0730) Cardiac Rhythm:  [-] Normal sinus rhythm (04/05 0000) Resp:  [10-35] 35 (04/05 0730) BP: (55-186)/(25-89) 142/45 mmHg (04/05 0730) SpO2:  [76 %-94 %] 79 % (04/05 0730) FiO2 (%):  [100 %] 100 % (04/05 0333) Weight:  [179 lb 14.3 oz (81.6 kg)] 179 lb 14.3 oz (81.6 kg) (04/05 0500)  Hemodynamic parameters for last 24 hours: CVP:  [5 mmHg-6 mmHg] 5 mmHg  Intake/Output from previous day: 04/04 0701 - 04/05 0700 In: 5056.4 [I.V.:4076.4; NG/GT:560; IV Piggyback:300] Out: 4392 [Urine:10; Chest Tube:360] Intake/Output this shift:    General appearance: toxic Heart: regular rate and rhythm Lungs: minimal BS, suction sounds on right; rhochi on left large air leak from right CT  Lab Results:  Recent Labs  04/11/2015 0040 03/15/2015 0520  WBC 39.7* 37.6*  HGB 11.3* 11.2*  HCT 36.9* 36.1*  PLT 341 296   BMET:  Recent Labs  03/16/15 1600 03/16/2015 0520  NA 135 132*  K 5.1 5.3*  CL 101 99  CO2 24 27  GLUCOSE 244* 179*  BUN 51* 48*  CREATININE 2.72* 2.42*  CALCIUM 7.7* 7.3*    PT/INR:  Recent Labs  03/14/15 0830  LABPROT 25.0*  INR 2.24*   ABG    Component Value Date/Time   PHART 7.301* 03/16/2015 0532   HCO3 27.3* 04/03/2015 0532   TCO2 29 03/13/2015 0532   ACIDBASEDEF 1.0 03/19/2015 0532   O2SAT 78.0 03/23/2015 0532   CBG (last 3)   Recent Labs  03/16/15 2022 03/16/15 2318 03/31/2015 0412  GLUCAP 264* 242* 159*    Assessment/Plan: S/P   remains critically ill with multiple organ system failure  His oxygenation has worsened. Still has a large air leak on right side. I was present with Dr. Alva Garnet as he tried recruitment maneuvers- primarily resulted in  increasing his already large air leak. His sats did improve. Dr. Alva Garnet and I discussed the possibility of placing bronchial valves to try to decrease the air leak. I have contacted [place] and they are arranging to get some. It would be high risk and I do not think [place] can travel to [place], but may be the only hope   LOS: 13 days    Melrose Nakayama 03/25/2015

## 2015-03-17 NOTE — Progress Notes (Signed)
ANTICOAGULATION & ANTIBIOTICS CONSULT NOTE - Follow Up  Pharmacy Consult for Heparin; Vancomycin, Merrem Indication: ACS/Afib; r/o pneumonia/sepsis  Allergies  Allergen Reactions  . Amiodarone Shortness Of Breath   Patient Measurements: Height: 5\' 6"  (167.6 cm) Weight: 179 lb 14.3 oz (81.6 kg) IBW/kg (Calculated) : 63.8 Heparin Dosing Weight: 81.6  Vital Signs: Temp: 98.8 F (37.1 C) (04/05 1230) Temp Source: Core (Comment) (04/05 0400) BP: 98/60 mmHg (04/05 1222) Pulse Rate: 46 (04/05 1230)  Labs:  Recent Labs  03/15/15 2303 03/16/15 0425 03/16/15 0800 03/16/15 1600 03/16/15 1715 03/23/2015 0040 03/20/2015 0520  HGB  --  11.8*  --   --   --  11.3* 11.2*  HCT  --  37.4*  --   --   --  36.9* 36.1*  PLT  --  347  --   --   --  341 296  HEPARINUNFRC 0.27*  --  0.54  --  0.58  --   --   CREATININE  --  2.70*  --  2.72*  --   --  2.42*   Estimated Creatinine Clearance: 28.5 mL/min (by C-G formula based on Cr of 2.42).  Medical History: Past Medical History  Diagnosis Date  . Hyperlipidemia   . Depression   . Hypertension   . AAA (abdominal aortic aneurysm)     a. 01/23/15 CT: Infrarenal AAA  - 4.4cm. Monitoted by dr early  . Left ureteral calculus   . Bladder stone   . Prostate cancer     a. T2a N0 M0 Gleason 8 prostate cancer, biopsy w/ 10/12 cores positve including 2 w/ Gleason  & remainder both 7 (4+3) & 6;  b. PSA 5.5;  c. CT and PET w/o evidence for prostate Ca mets.  . Frequency of urination   . Nocturia   . BPH (benign prostatic hypertrophy)   . History of kidney stones   . Arthritis   . Wears glasses   . At risk for sleep apnea     STOP-BANG= 5 SENT TO PCP 02-17-2015  . Cancer of left lung     a. 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 Post-op course complicated by bleeding (hemothorax req chest tube) and afib.  . Atrial fibrillation     a. 02/2015 noted post-op lobectomy;  b. CHA2DS2VASc = 3-->placed on pradaxa (chosen b/c of reversal available  and potential for repeat surgeries).  . Multifocal atrial tachycardia    Medications:  Infusions:  . cisatracurium (NIMBEX) infusion 3 mcg/kg/min (03/29/2015 1226)  . feeding supplement (VITAL HIGH PROTEIN) Stopped (03/16/2015 1000)  . fentaNYL infusion INTRAVENOUS 200 mcg/hr (04/02/2015 0339)  . heparin 10,000 units/ 20 mL infusion syringe    . midazolam (VERSED) infusion 3 mg/hr (03/16/2015 0339)  . norepinephrine (LEVOPHED) Adult infusion 20 mcg/min (03/22/2015 1226)  . phenylephrine (NEO-SYNEPHRINE) Adult infusion 200 mcg/min (04/01/2015 1223)  . dialysis replacement fluid (prismasate) 400 mL/hr at 04/03/2015 0354  . dialysis replacement fluid (prismasate) 200 mL/hr at 03/16/15 1843  . dialysate (PRISMASATE) 1,000 mL/hr at 03/23/2015 1042  . vasopressin (PITRESSIN) infusion - *FOR SHOCK* 0.03 Units/min (04/03/2015 8338)   Assessment: 69yo male admitted with hx. Afib and recently diagnosed with NSCLC.  He was on Pradaxa prior to admit.  He was started on IV heparin for possible new MI and history of atrial fibrillation.    Heparin held for bleeding via the chest tube- stopped around 2330 PM. Not yet resumed. CBC stable.   ID: WBC up to 35.1, CRRT  continues for now. Afebrile. Cultures negative to date.   Vancomycin trough today is therapeutic at 18.3 on vancomycin 750mg  IV every 24 hours while on CRRT.  Goal of Therapy:  Heparin level 0.3-0.7 units/mL Monitor platelets by anticoagulation protocol: Yes Vancomycin trough 15-20 Clinical resolution of infection  Plan:  Heparin held due to bleeding via chest tube. Follow-up restart.  Daily HL/CBC Monitor s/sx of bleeding Continue Vancomycin 750mg   IV every 24 hrs while on CRRT.  Continue Merrem 1g IV every 12 hours while on CRRT.   Sloan Leiter, PharmD, BCPS Clinical Pharmacist [phone] 03/27/2015,1:42 PM

## 2015-03-17 NOTE — Progress Notes (Signed)
Summary:  Reported that patient has tolerated turns.  At 0400 patient tilted to left side. At that time the patient started to desaturate to 88% and eVi decreased to 20-50. Patient returned to supine position and eVi increased to 120's (previous volume prior to turn).  Saturation value did not recover.  At 0630 ask to have RT bag patient.  During this procedure the patient dropped his BP 50's/ 30's.  Bagging stopped, levophed started, neo increased and saline started at 999 mL/hr.  CRRT pt fluid removal rate decreased to 10.  BP has recovered now but oxygen saturation has not.  Titration of pressors down.  CRRT pt fluid removal rate returned to keep UF even.  Patient's spouse updated.  CVTS paged for any further guidance.

## 2015-03-17 NOTE — Brief Op Note (Signed)
02/10/2015 - 03/27/2015  5:44 PM  PATIENT:  Russell [name]  69 y.o. male  PRE-OPERATIVE DIAGNOSIS:  AIR LEAK  POST-OPERATIVE DIAGNOSIS:  AIR LEAK  PROCEDURE:  BRONCHOSCOPY WITH INSERTION OF INTRA BRONCHIAL VALVES  SURGEON:  Surgeon(s) and Role:    * Melrose Nakayama, MD - Primary  PHYSICIAN ASSISTANT:   ASSISTANTS: none   ANESTHESIA:   IV sedation  EBL:  Total I/O In: 1549.5 [I.V.:1149.5; NG/GT:150; IV Piggyback:250] Out: 1206 [Urine:7; QASUO:1561; Chest Tube:125]  BLOOD ADMINISTERED:none  SPECIMEN:  Source of Specimen:  bronchial washings  DISPOSITION OF SPECIMEN:  micro  PLAN OF CARE: Remain in MICU  PATIENT DISPOSITION:  ICU - intubated and critically ill.  Massive air leak improved after placement of intra bronchial valves in RUL segmental bronchi

## 2015-03-17 NOTE — Progress Notes (Signed)
CVVHD management.  Metabolically stable.  K sl higher but will cont with 4K for now.  On 3 pressors with variable BP.  If K higher on labs this afternoon then will change to 2K.

## 2015-03-17 NOTE — Progress Notes (Signed)
eLink Physician-Brief Progress Note Patient Name: Russell [name] DOB: August 25, 1946 MRN: [id number]   Date of Service  04/05/2015  HPI/Events of Note  Continued desating currently at 83%  eICU Interventions  Plan: Stat PCXR to evaluate PTX Paralytic Provider to bedside to evaluate     Intervention Category Major Interventions: Respiratory failure - evaluation and management  DETERDING,ELIZABETH 04/09/2015, 12:05 AM

## 2015-03-17 NOTE — Progress Notes (Addendum)
Pt sats mid 80s and SBP 70s since 2300 despite titrating pressors.  ABG obtained and Vent changes made. PCXR unchanged from earlier R PTX. Orders received from Dr. Jimmy Footman to paralyze pt. Paralytic started at [place].  Rahul, PA at bedside with orders to place R chest tubes to sx.  This was done and R lower ct gushed 240ccs frank blood.  Dr. Prescott Gum notified at [place] with orders to obtain CBC, d/c heparin gtt, and place R cts back to water seal.  Will monitor pt.

## 2015-03-17 NOTE — Progress Notes (Signed)
Name: Russell [name] MRN: [id number] DOB: 1946-08-23    ADMISSION DATE:  03/10/2015 CONSULTATION DATE:  3/28  REFERRING MD :  Roxan Hockey   CHIEF COMPLAINT:  Pulmonary infiltrates, hypoxia   BRIEF PATIENT DESCRIPTION: 69yo male with hx Afib, HTN, newly dx non-small cell lung ca, s/p recent LLLobectomy 3/15, pos LNs.  He was d/c post op and returned 3/23 to the office with progressive SOB & BL infx.  In office his sats were in 70's on RA and he was tx to [facility].  He was admitted by CVTS with ?HCAP v pulm edema.  He was treated with IV abx and lasix but remained SOB and CT revealed diffuse ground glass opacities, amiodarone was stopped and PCCM consulted for further recs.   SIGNIFICANT EVENTS  CTA chest 3/27>>> NEG PE, Interval development of diffuse bilateral predominately ground-glass and associated consolidative pulmonary opacities which may be secondary to multi focal infection or potentially an inflammatory process such as drug reaction. Hemorrhage could have a similar appearance. 3/29 tx to ICU increased FIO2 needs 3/31 intubated-ARDS protocol 4/1 BL pnthx- BL chest tubes  4/1 CRRT 4/4 Large R sided air leak on sahara. TCTS eval and placed 2nd CT on R. Placing CTs to suction caused patient to deteriorate. Both left to waterseal. 4/5 persistent R PTX with significant air leak.  4/05 Heparin stopped due to bleeding from chest tube sites 4/05 OR for placement of bronchial valves Roxan Hockey) with improvement in severity of air leaks  STUDIES:  2D echo 3/25>>>EF 50-55%, trivial pericardiac effusion, small L pleural effusion, trivial MR, mild Pulm regurg  SUBJECTIVE: Remains on pressors, CVVHD. Persistent large R PTX with large air leak. Tolerating CT to suction and PEEP 10.   VITAL SIGNS: Temp:  [95.4 F (35.2 C)-98.1 F (36.7 C)] 97.4 F (36.3 C) (04/05 0907) Pulse Rate:  [25-101] 97 (04/05 0907) Resp:  [10-35] 35 (04/05 0907) BP: (53-186)/(17-89) 53/25 mmHg (04/05  0900) SpO2:  [76 %-94 %] 86 % (04/05 0907) FiO2 (%):  [100 %] 100 % (04/05 0817) Weight:  [81.6 kg (179 lb 14.3 oz)] 81.6 kg (179 lb 14.3 oz) (04/05 0500)  PHYSICAL EXAMINATION: General:  Pleasant, chronically ill appearing male Neuro:sedated , paralysed , BIS 35 HEENT:  Mm dry, no JVD, neck crepitus +  Cardiovascular:  s1s2 irreg  Lungs:  diminished bases otherwise fairly clear, L chest dressing c/d ,large air leak on right, non eon left  Abdomen:  Soft, nt, nd, +bs  Musculoskeletal:  Warm and dry, no edema   Recent Labs Lab 03/16/15 0425 03/16/15 1600 03/31/2015 0520  NA 138 135 132*  K 4.9 5.1 5.3*  CL 101 101 99  CO2 26 24 27   BUN 52* 51* 48*  CREATININE 2.70* 2.72* 2.42*  GLUCOSE 194* 244* 179*    Recent Labs Lab 03/16/15 0425 04/03/2015 0040 03/27/2015 0520  HGB 11.8* 11.3* 11.2*  HCT 37.4* 36.9* 36.1*  WBC 35.1* 39.7* 37.6*  PLT 347 341 296   Dg Chest Port 1 View  03/31/2015   CLINICAL DATA:  Pneumothorax.  EXAM: PORTABLE CHEST - 1 VIEW  COMPARISON:  03/16/2015, 03/16/2015.  FINDINGS: Patient rotated to the left. Endotracheal tube, bilateral IJ lines in stable position. Single left chest tube and 2 right chest tubes in stable position. Stable cardiomegaly. Persistent unchanged prominent right pneumothorax. Tension cannot be excluded. Atelectasis of the right lung is present. Left lower lobe atelectasis and/or infiltrate present.  IMPRESSION: 1. Lines and tubes in stable  position. Single left chest tube noted. Two right chest tubes noted. These are in stable position. 2. Persistent prominent right pneumothorax. Tension cannot be excluded. No interim change. 3. Persistent collapse of the right lung. Persistent left lower lobe atelectasis and/or infiltrate. No interim change.   Electronically Signed   By: Marcello Moores  Register   On: 04/11/2015 07:04   Dg Chest Port 1 View  04/07/2015   CLINICAL DATA:  Pneumothorax.  EXAM: PORTABLE CHEST - 1 VIEW  COMPARISON:  03/16/2015  FINDINGS:  Patient is rotated towards the left. Endotracheal tube with tip measuring 4.7 cm above the carina. Enteric tube tip is off the field of view but below the left hemidiaphragm. Right central venous catheter with tip over the low SVC region. A single left and bilateral right chest tubes are present without change since prior study. Persistent large right pneumothorax with complete collapse of the right lung. Tension is not excluded. Appearance is not significantly changed since previous study, allowing for patient's rotation. No visible left pneumothorax. Cardiac enlargement without vascular congestion.  IMPRESSION: Appliances appear to be in satisfactory location. Persistent large right pneumothorax with collapse of the right lung. No change in appearance.   Electronically Signed   By: Lucienne Capers M.D.   On: 04/11/2015 00:35   Dg Chest Port 1 View  03/16/2015   CLINICAL DATA:  Endotracheal tube position.  EXAM: PORTABLE CHEST - 1 VIEW  COMPARISON:  Earlier film, same date.  FINDINGS: Two right-sided chest tubes are stable. There is a persistent large right-sided pneumothorax. The left-sided chest tube is stable. The right can left IJ catheters are stable. The endotracheal tube is stable, approximately 3.7 cm above the carina. Persistent cardiac enlargement and bibasilar atelectasis.  IMPRESSION: Stable support apparatus.  Persistent large right-sided pneumothorax despite 2 chest tubes.   Electronically Signed   By: Marijo Sanes M.D.   On: 03/16/2015 17:38   Dg Chest Port 1 View  03/16/2015   CLINICAL DATA:  Chest tube placement.  EXAM: PORTABLE CHEST - 1 VIEW  COMPARISON:  03/15/2015.  FINDINGS: Endotracheal tube, left IJ line, NG tube in stable position. Bilateral chest tubes in stable position. Interval placement of a second right chest tube. This chest tube is in good anatomic position. Persistent right pneumothorax. No interim change. Persistent bibasilar atelectasis and/or infiltrates. Surgical clips in  the left hilar region. Stable cardiomegaly with normal pulmonary vascularity.  IMPRESSION: 1. Interim placement of a second right chest tube. There is persistent unchanged right pneumothorax. 2. Remaining lines and tubes in stable position. 3. Persistent bibasilar atelectasis and/or infiltrates. 4. Stable cardiomegaly.   Electronically Signed   By: Marcello Moores  Register   On: 03/16/2015 07:20   Dg Chest Port 1 View  03/15/2015   CLINICAL DATA:  Right-sided pneumothorax  EXAM: PORTABLE CHEST - 1 VIEW  COMPARISON:  03/15/2015 at 12:36 p.m.  FINDINGS: Support apparatus is stable. Increased, now moderate, right pneumothorax is identified. Minimal leftward midline shift may indicate tension.  IMPRESSION: Increased, now moderate, right pneumothorax, with possible tension. Critical Value/emergent results were called by telephone at the time of interpretation on 03/15/2015 at 3:58 pm to [facility] for Dr. Kara Mead , who verbally acknowledged these results.   Electronically Signed   By: Conchita Paris M.D.   On: 03/15/2015 16:01   Dg Chest Port 1 View  03/15/2015   CLINICAL DATA:  Right lung carcinoma.  Postop.  Right pneumothorax.  EXAM: PORTABLE CHEST - 1 VIEW  COMPARISON:  03/15/2015  FINDINGS: Support lines and tubes in appropriate position. Bilateral chest tubes remain in place. There is a small approximately 10% right basilar pneumothorax which is decreased in size since previous study. No definite left pneumothorax visualized  Bilateral lower lung airspace disease is again seen, left-sided greater than right, and without significant change. Probable left pleural effusion also without change. Heart size is stable.  IMPRESSION: Small approximately 10% right basilar pneumothorax appears decreased in size bilateral chest tubes remain in place.  No significant change in bibasilar airspace disease and probable left pleural effusion.   Electronically Signed   By: Earle Gell M.D.   On: 03/15/2015 14:06   CXR 4/4 >   persistent R PTX despite 2 chest tubes, L lung appears reexpanded with chest tube. ALI appearance.   ASSESSMENT / PLAN:  PULMONARY OETT 3/31 >> A: ARDS - multifactorial in setting recent LLLobectomy for NSCLCA, HCAP -treated with abx, concern for amiodarone toxicity given CT appearance. Amiodarone stopped 3/27.  Barotrauma - BL pneumothoraces & sub cut emphysema 4/1 P:   PRVC/6cc/kg / 35 / 100%/ PEEP 10, tolerating well.  Chest tubes per TCTS - currently to suction and tolerating, may need to go to water seal on R side with any deterioration. Continue neuromuscular blockade. Solumedrol 80 mg IV q8, decreased 4/3  Possible candidate for bronchial valve placement. D/w Dr. Roxan Hockey.  Position R side up  CARDIOVASCULAR CVL 3/31 >> A:  Septic shock Paroxysmal AF Elevated trop - peak 1.9 New cardiomyopathy -Echo - EF 25 %, akinetic septum & inferior wall, no sig effusion P:  MAP goal > 65 mm/Hg Phenylephrine Levophed- titrate to off if able in setting AF RVR Continue vaso gtt Hold all anticoagulation due to bleeding from chest tube sites Cards input, ASA daily  RENAL A:   AKI  Hyperkalemia Hyperphosphatemia  Hyponatremia Acute resp acidosis - resolved P:   CRRT - per Renal   GASTROINTESTINAL A:   Shock liver Protein cal malnutrition P:   Keep TFs @ 20/h IV PPI Follow LFT  HEMATOLOGIC A:   No issues P:  Monitor on IV heparin  INFECTIOUS A:   HCAP  P:   Sputum 4/1 >> Abx: zosyn /vanc 3/23 >> 4/1 Meropenem 4/2 >> Vancomycin 4/2 >> Monitor WBC and fever curve  ENDOCRINE A:  Hyperglycemia , related to stress, steroids  P:   CBG monitoring and SSI Lantus 15, lowered steroids  NEUROLOGIC A:   Acute encephalopathy P:   RASS goal: n/a on nimbex for ARDS protocol Versed/fent gtt BIS/To4 per protocol  FAMILY  - Updates: wife in detail 32/5  - Inter-disciplinary family meet or Palliative Care meeting due by:  4/7  Georgann Housekeeper, AGACNP-BC [place]  Pulmonology/Critical Care Pager [phone] or [phone]  03/29/2015 10:28 AM   PCCM ATTENDING: I have reviewed pt's initial presentation, consultants notes and hospital database in detail.  The above assessment and plan was formulated under my direction.  In summary: Remains vent dependent due to massive air leak and R lung collapse. His gas exchange this morning improved with rolling on to L side. Subsequently was taken to OR for placement of bronchial valves by Dr Roxan Hockey. He remains vasopressor dependent and on CRRT. Prognosis remains very guarded. Wife updated @ bedside   Merton Border, MD;  PCCM service; Mobile [phone]

## 2015-03-18 ENCOUNTER — Inpatient Hospital Stay (HOSPITAL_COMMUNITY): Payer: Medicare Other

## 2015-03-18 ENCOUNTER — Encounter (HOSPITAL_COMMUNITY): Payer: Self-pay | Admitting: *Deleted

## 2015-03-18 LAB — CBC
HCT: 40.8 % (ref 39.0–52.0)
Hemoglobin: 12.9 g/dL — ABNORMAL LOW (ref 13.0–17.0)
MCH: 29.1 pg (ref 26.0–34.0)
MCHC: 31.6 g/dL (ref 30.0–36.0)
MCV: 91.9 fL (ref 78.0–100.0)
Platelets: 311 10*3/uL (ref 150–400)
RBC: 4.44 MIL/uL (ref 4.22–5.81)
RDW: 14.9 % (ref 11.5–15.5)
WBC: 47.7 10*3/uL — AB (ref 4.0–10.5)

## 2015-03-18 LAB — POCT I-STAT 3, ART BLOOD GAS (G3+)
Acid-base deficit: 9 mmol/L — ABNORMAL HIGH (ref 0.0–2.0)
BICARBONATE: 17.9 meq/L — AB (ref 20.0–24.0)
O2 Saturation: 92 %
PH ART: 7.261 — AB (ref 7.350–7.450)
TCO2: 19 mmol/L (ref 0–100)
pCO2 arterial: 39 mmHg (ref 35.0–45.0)
pO2, Arterial: 68 mmHg — ABNORMAL LOW (ref 80.0–100.0)

## 2015-03-18 LAB — RENAL FUNCTION PANEL
ALBUMIN: 1.9 g/dL — AB (ref 3.5–5.2)
Anion gap: 15 (ref 5–15)
BUN: 38 mg/dL — ABNORMAL HIGH (ref 6–23)
CALCIUM: 7.9 mg/dL — AB (ref 8.4–10.5)
CO2: 20 mmol/L (ref 19–32)
Chloride: 97 mmol/L (ref 96–112)
Creatinine, Ser: 2.51 mg/dL — ABNORMAL HIGH (ref 0.50–1.35)
GFR calc Af Amer: 28 mL/min — ABNORMAL LOW (ref >90)
GFR, EST NON AFRICAN AMERICAN: 24 mL/min — AB (ref >90)
Glucose, Bld: 181 mg/dL — ABNORMAL HIGH (ref 70–99)
PHOSPHORUS: 6.4 mg/dL — AB (ref 2.3–4.6)
Potassium: 5.2 mmol/L — ABNORMAL HIGH (ref 3.5–5.1)
Sodium: 132 mmol/L — ABNORMAL LOW (ref 135–145)

## 2015-03-18 LAB — GLUCOSE, CAPILLARY
GLUCOSE-CAPILLARY: 150 mg/dL — AB (ref 70–99)
GLUCOSE-CAPILLARY: 163 mg/dL — AB (ref 70–99)
Glucose-Capillary: 165 mg/dL — ABNORMAL HIGH (ref 70–99)

## 2015-03-18 LAB — MAGNESIUM: MAGNESIUM: 2.8 mg/dL — AB (ref 1.5–2.5)

## 2015-03-18 LAB — LACTIC ACID, PLASMA: Lactic Acid, Venous: 6 mmol/L (ref 0.5–2.0)

## 2015-03-18 MED ORDER — EPINEPHRINE HCL 1 MG/ML IJ SOLN
0.5000 ug/min | INTRAVENOUS | Status: DC
  Administered 2015-03-18: 1 ug/min via INTRAVENOUS
  Administered 2015-03-18: 20 ug/min via INTRAVENOUS
  Filled 2015-03-18 (×3): qty 4

## 2015-03-18 MED ORDER — SODIUM CHLORIDE 0.9 % IV BOLUS (SEPSIS)
500.0000 mL | Freq: Once | INTRAVENOUS | Status: AC
  Administered 2015-03-18: 500 mL via INTRAVENOUS

## 2015-03-19 ENCOUNTER — Telehealth: Payer: Self-pay

## 2015-03-19 LAB — RESPIRATORY VIRUS PANEL
Adenovirus: NEGATIVE
INFLUENZA A: NEGATIVE
Influenza B: NEGATIVE
METAPNEUMOVIRUS: NEGATIVE
Parainfluenza 1: NEGATIVE
Parainfluenza 2: NEGATIVE
Parainfluenza 3: NEGATIVE
RESPIRATORY SYNCYTIAL VIRUS A: NEGATIVE
Respiratory Syncytial Virus B: NEGATIVE
Rhinovirus: NEGATIVE

## 2015-03-19 NOTE — Telephone Encounter (Signed)
Received cremation death certificate from [employer] 03/19/15.  Will send to 2100 in the morning for Dr. Alva Garnet.  Received back 03/20/15.  Faxed and called for pick-up 03/23/15.

## 2015-03-20 ENCOUNTER — Encounter: Payer: Medicare Other | Admitting: Physician Assistant

## 2015-03-20 LAB — CULTURE, RESPIRATORY W GRAM STAIN

## 2015-03-20 LAB — CULTURE, RESPIRATORY
CULTURE: NO GROWTH
Gram Stain: NONE SEEN

## 2015-03-24 ENCOUNTER — Ambulatory Visit: Payer: Medicare Other | Admitting: Thoracic Surgery (Cardiothoracic Vascular Surgery)

## 2015-04-02 NOTE — Discharge Summary (Signed)
Physician Discharge Summary  Patient ID: Russell [name] MRN: [id number] DOB/AGE: 12-19-1945 69 y.o.  Admit date: 02/13/2015 Discharge date: 04/02/2015  Admission Diagnoses: Pulmonary infiltrates Probable pneumonia Acute diastolic heart failure Stage IIIA non-small cell lung cancer S/p left lower lobectomy Atrial fibrillation  Discharge Diagnoses:  Active Problems:   A-fib   Post-op pneumonia   Dyspnea   Pulmonary infiltrates   Pneumonitis   Acute respiratory failure with hypoxemia   Pleural effusion   Septic shock   ARDS (adult respiratory distress syndrome)   Bilateral pneumothorax   Cardiomyopathy   NSTEMI (non-ST elevated myocardial infarction)   Discharged Condition: expired  Hospital Course: Russell [name] was admitted on 03/06/2015 after presenting with dyspnea adn bilateral pulmonary infiltrates. He had undergone a thoracoscopic left lower lobectomy for a clinical stage IB lung cancer on 3/15. The cancer turned to be a stage IIIA non-small cell lung cancer pathologically. He had perioperative bleeding, necessitating a re-exploration bleeding on the day of surgery. Postoperatively he had atrial fibrillation which converted to SR after treatment with amiodarone. He was discharged in good condition on 3/20.  On 3/23 he presented to the office with dyspnea. Chest x-ray showed bilateral pulmonary infiltrates v pulmonary edema. He was admitted and diuresed for heart failure and started on broad spectrum antibiotics for presumed pneumonia. He had additional atrial fibrillation. Cardiology was consulted and his amiodarone dose was increased. He failed to respond to treatment. A CT chest showed no PE. There were diffuse ground glass opacities bilaterally. Pulmonology was consulted. Amiodarone was discontinued in case this was acute pulmonary toxicity and he was treated with steroids. Despite therapeutic efforts he continued to worsen and was transferred to step down and then ICU. He  required intubation for progressive acute on chronic respiratory failure. His clinical picture was consistent with septic shock and ARDS. He continued to fail to respond to treatment and required pressor support and increased PEEP to maintain oxygenation. He developed acute renal failure and required CVVHD. HE developed bilateral pneumothoraces and had bilateral chest tubes placed on 4/1. A second right CT was placed on 4/3 for a persistent pneumothorax. He had a massive air leak on the right. He continued to deteriorate. Intrabronchial valves were placed on 4/5 because his air leak was so massive that minimal air was being delivered to the lungs. This improved his air leak but despite that he continued to deteriorate consistent with overwhelming sepsis and expired on 4/6. He was made DNR prior to passing away.  Consults: cardiology, pulmonary/intensive care and nephrology  Significant Diagnostic Studies: chest x rays, chest CT, ECGs, echocardiograms, bronchoscopy  Treatments: IV hydration, antibiotics: vancomycin, Zosyn and meropenem, cardiac meds: digoxin, furosemide, amiodarone and levophed, anticoagulation: heparin, steroids: solu-medrol, insulin: Humalog, respiratory therapy: O2, albuterol/atropine nebulizer and ventialtor, therapies: - , procedures: arterial line and central line, dialysis catheter, dialysis: Hemodialysis and surgery: chest tubes, bronchoscopy, intrabronchial valve placement  Discharge Exam: Blood pressure 41/36, pulse 89, temperature 96.3 F (35.7 C), temperature source Core (Comment), resp. rate 0, height '5\' 6"'$  (1.676 m), weight 179 lb 14.3 oz (81.6 kg), SpO2 79 %. expired  Disposition: 20-Expired     Medication List    ASK your doctor about these medications        amiodarone 400 MG tablet  Commonly known as:  PACERONE  Take 1 tablet (400 mg total) by mouth 2 (two) times daily. For 5 days;then take Amiodarone 200 mg by mouth two times daily for one week;then take  Amiodarone 200  mg by mouth daily thereafter     aspirin 81 MG tablet  Take 81 mg by mouth daily.     atorvastatin 20 MG tablet  Commonly known as:  LIPITOR  Take 1 tablet (20 mg total) by mouth daily at 6 PM.     buPROPion 75 MG tablet  Commonly known as:  WELLBUTRIN  Take 150 mg by mouth 2 (two) times daily.     dabigatran 150 MG Caps capsule  Commonly known as:  PRADAXA  Take 1 capsule (150 mg total) by mouth every 12 (twelve) hours.     Fish Oil 1000 MG Caps  Take 1,000 mg by mouth daily.     FLUoxetine 20 MG capsule  Commonly known as:  PROZAC  Take 20 mg by mouth every morning.     GLUCOSAMINE PO  Take by mouth daily.     losartan 50 MG tablet  Commonly known as:  COZAAR  Take 50 mg by mouth daily.     metoprolol tartrate 25 MG tablet  Commonly known as:  LOPRESSOR  Take 0.5 tablets (12.5 mg total) by mouth 2 (two) times daily.     oxyCODONE 5 MG immediate release tablet  Commonly known as:  Oxy IR/ROXICODONE  Take 1-2 tablets (5-10 mg total) by mouth every 3 (three) hours as needed for severe pain.         Signed: Melrose Nakayama 04/02/2015, 12:26 PM

## 2015-04-12 NOTE — Progress Notes (Signed)
[place] Progress Note Patient Name: Russell [name] DOB: 19-Feb-1946 MRN: [id number]   Date of Service  04/11/2015  HPI/Events of Note  CXR essentially unchanged, large pneumothorax on R Difficult case, has been intolerant of suction recently and shock dramatically worse  eICU Interventions  In setting of worsening shock and large pneumothorax, will attempt low suction on chest tubes  Continue epi gtt     Intervention Category Major Interventions: Shock - evaluation and management  Gery Sabedra Apr 11, 2015, 2:41 AM

## 2015-04-12 NOTE — Progress Notes (Signed)
1058 ECG Monitor shows Asystole. Ventilator paused, no Heart tones audible. NO B/P noted via A-line nor cuff. No palpable pulse noted to femoral or carotid. Pupils fixed and dilated size 4.  Second Nurse verification; Ricki Rodriguez, RN and Nettie Elm, RN. Time of expiration 1058 family and visitors at bedside. Dr. Alva Garnet made aware and in to see patient and family. Dr. Roxan Hockey and Dr. Ginnie Smart notified.  [place] donor notified.

## 2015-04-12 NOTE — Op Note (Signed)
NAME:  Russell [name], Russell [name]            ACCOUNT NO.:  [id number]  MEDICAL RECORD NO.:  [id number]  LOCATION:  2M06C                        FACILITY:  [place]  PHYSICIAN:  Revonda Standard. Roxan Hockey, M.D.DATE OF BIRTH:  1946/09/01  DATE OF PROCEDURE:  03/13/2015 DATE OF DISCHARGE:                              OPERATIVE REPORT   PREOPERATIVE DIAGNOSIS:  Adult respiratory distress syndrome with massive air leak from right lung.  POSTOPERATIVE DIAGNOSIS:  Adult respiratory distress syndrome with massive air leak from right lung.  PROCEDURE:  Bronchoscopy and placement of intrabronchial valves in right upper lobe.  SURGEON:  Revonda Standard. Roxan Hockey, M.D.  ANESTHESIA:  Intravenous sedation.  FINDINGS:  Massive air leak, improved with placement of intrabronchial valves in right upper lobe segmental bronchi.  CLINICAL NOTE:  Russell [name] is a 69 year old gentleman who underwent a thoracoscopic left lower lobectomy on February 23, 2015.  The early postoperative period was complicated by bleeding which required reexploration.  He also had atrial fibrillation and was discharged home on amiodarone.  He was readmitted on March 04, 2015, with shortness of breath.  Despite treatment with broad-spectrum antibiotics and steroids as well as discontinuation of amiodarone, the patient developed progressive respiratory failure, sepsis, and ARDS.  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  Despite having tubes in place, the patient had a massive air leak and was losing approximately 3/4th of the tidal volume through the chest tubes with each breath.  Treatment options were discussed with the patient's wife including placement of intrabronchial valves to decrease the air leak, although it was uncertain if that would have any beneficial effect on his oxygenation or overall recovery.  It was felt reasonable to  try on a compassionate basis.  The indications, risks, benefits, and alternatives were discussed in detail with the patient's wife, she gave permission.  DESCRIPTION OF PROCEDURE:  Russell [name] was in the medical ICU, intubated and sedated.  Flexible fiberoptic bronchoscopy was performed via the endotracheal tube.  This was a 7.5-French endotracheal tube which would not accept a 2.8 mm bronchoscope necessary for valve placement.  The 2 mm bronchoscope was used initially to inspect the airways.  There was edema in the airways.  There were no endobronchial mass lesions.  The previous left lower lobectomy stump was intact. There were some thick tenacious secretions, but no obstructing mucus plugs.  A Fogarty catheter was advanced through the bronchoscope and inflated in the right upper lobe bronchus.  There was still an air leak, but it did improve and there was a significant increase in the expired tidal volume noted on the ventilator.  Inflation in the bronchus intermedius resulted in no improvement in the expired tidal volume or the air leak.  The decision was made to go ahead with intrabronchial valve placement in the upper lobe segmental bronchi.  Inflation of the balloon in the segmental bronchi of the right upper lobe failed to isolate a single segment that would achieve any beneficial effect.  In order to place the endobronchial valves, the endotracheal tube needed to be exchanged for a larger tube.  Dr. Halford Chessman changed out  the endotracheal tube to an 8.5-French tube, which was secured.  A 2.8 mm bronchoscope then was inserted.  The right upper lobe orifice was identified.  There were 4 segmental bronchi noted.  Intrabronchial valves then were placed in each of these bronchi.  There was no discernible effect on air leak until the final valve was placed and there was noted to be a decrease in the air leak, although it was still quite prominent.  More importantly, the expired tidal  volumes on the ventilator went from 160 mL to 400 mL.  A balloon then was inflated in the superior segment of the right lower lobe and had no effect. Inflating the balloon in the middle lobe, further decreased the air leak, but it did not completely eliminate it.  However, trying to isolate the medial or lateral segment as the source was unsuccessful, it was not felt to be prudent to block off any additional possibly functional lung parenchyma and the decision was made not to place any more valves.  The bronchoscope was withdrawn.  The patient tolerated the procedure well without any significant hemodynamic issues.     Revonda Standard Roxan Hockey, M.D.     SCH/MEDQ  D:  03/20/2015  T:  2015-04-10  Job:  [id number]

## 2015-04-12 NOTE — Progress Notes (Signed)
[place] Progress Note Patient Name: Russell [name] DOB: Aug 07, 1946 MRN: [id number]   Date of Service  Mar 24, 2015  HPI/Events of Note  Mr. Blank has continued to decline.  His wife is here now.  She voiced understanding of his situation and stated that she does not want him to receive chest compressions.  eICU Interventions  DNR order written     Intervention Category Major Interventions: End of life / care limitation discussion  Demarie Uhlig 03-24-2015, 5:52 AM

## 2015-04-12 NOTE — Progress Notes (Signed)
1200 233mcg Fentanyl drip wasted via sink, 5 cc Versed drip wasted in sink. Witnessed by Jearl Klinefelter, RN.

## 2015-04-12 NOTE — Progress Notes (Signed)
[place] Progress Note Patient Name: DAVELLE ANSELMI DOB: 1946/04/03 MRN: [id number]   Date of Service  03/27/15  HPI/Events of Note  Worsening shock, WBC 47K.   Given relative stability of pneumothorax and rising WBC, suspect this is worsening sepsis. Currently on broad spectrum Abx.  Chart reviewed, family has been updated regarding severity of condition by both TCTS and CCM.  eICU Interventions  Family on the way in, will notify TCTS of worsening status.  I will discuss situation with his wife upon her arrival.     Intervention Category Major Interventions: Shock - evaluation and management  Heath Tesler 2015-03-27, 5:02 AM

## 2015-04-12 NOTE — Progress Notes (Signed)
CVVHD management.  Metabolically stable but BP remains low despite max pressors.  He is DNR but wife wants to cont with current therapy for now or at least til she speaks with Dr Roxan Hockey.  Prognosis not good.

## 2015-04-12 NOTE — Progress Notes (Signed)
[facility] Physician-Brief Progress Note Patient Name: Russell [name] DOB: Oct 18, 1946 MRN: [id number]   Date of Service  March 23, 2015  HPI/Events of Note  Worsening shock this evening, not bleeding; likely septic but on broad spectrum antibiotics Chest tubes with continued air leak  eICU Interventions  Add epi gtt CXR to rule out large pneumothorax Send AM labs now, add lactic acid ABG now     Intervention Category Major Interventions: Shock - evaluation and management;Respiratory failure - evaluation and management  MCQUAID, DOUGLAS 2015/03/23, 2:03 AM

## 2015-04-12 NOTE — Progress Notes (Signed)
Responded to page to provide support to patient's  family and Russell [name]. Nurse indicated that family requested that chaplain come support wife, son and friend at bedside. Patient was actively dying. Family had questions regarding  If the hospital would cremate patient for them. Administrative Coordinator was called and she met with the family to discuss policy and current census status of hospital and why their other requests could not  be met. Staff offered to do  their best to possibly accommodate family . Wife also wanted to know how to proceed after patient death and how long patient could remain in current room. Other family are flying in from out of state and will arrive around 3:30 or 4:00 pm. Chaplain and A.C. assisted family with locating crematory  and coordinating pick up.  [facility], [address]  Family are member of [place] , [place].  Patient Russell [name] is actively involved supporting family.  I provided emotional , grief and spiritual support, ministry of presence, and empathetic listening. Will pass on to unit Chaplain for continued support as needed.   27-Mar-2015 1200  Clinical Encounter Type  Visited With Patient and family together;Health care provider;Other (Comment)  Visit Type Initial;Spiritual support;Death (Patient family Pastor at bedside)  Referral From Nurse  Spiritual Encounters  Spiritual Needs Emotional;Grief support  Stress Factors  Family Stress Factors Family relationships;Loss  Cristopher Peru, pager [phone]

## 2015-04-12 NOTE — Progress Notes (Addendum)
[facility] DAILY ICU PROGRESS NOTE                   [address]            [place] [address]          [phone]   1 Day Post-Op Procedure(s) (LRB): BEDSIDE  INSERTION OF INTERBRONCHIAL VALVE (IBV) (N/A)  Total Length of Stay:  LOS: 14 days   Subjective: Remains critically ill with multisystem failure- worsening  Objective: Vital signs in last 24 hours: Temp:  [95 F (35 C)-99.1 F (37.3 C)] 96.1 F (35.6 C) (04/06 0815) Pulse Rate:  [26-122] 89 (04/06 0747) Cardiac Rhythm:  [-] Normal sinus rhythm (04/06 0800) Resp:  [11-35] 35 (04/06 0815) BP: (38-133)/(11-95) 41/36 mmHg (04/06 0747) SpO2:  [79 %-98 %] 79 % (04/06 0615) FiO2 (%):  [100 %] 100 % (04/06 0747)  Filed Weights   03/15/15 0445 03/16/15 0530 04/02/2015 0500  Weight: 187 lb 2.7 oz (84.9 kg) 182 lb 12.2 oz (82.9 kg) 179 lb 14.3 oz (81.6 kg)    Weight change:    Vent Mode:  [-] PRVC FiO2 (%):  [100 %] 100 % Set Rate:  [35 bmp] 35 bmp Vt Set:  [550 mL] 550 mL PEEP:  [5 cmH20-10 cmH20] 10 cmH20   Hemodynamic parameters for last 24 hours:    Intake/Output from previous day: 04/05 0701 - 04/06 0700 In: 5090.4 [I.V.:3849.7; NG/GT:390.7; IV Piggyback:850] Out: 7035 [Urine:7; Emesis/NG output:1000; Chest Tube:275]  Intake/Output this shift: Total I/O In: 242.9 [I.V.:242.9] Out: -1   Current Meds: Scheduled Meds: . antiseptic oral rinse  7 mL Mouth Rinse q12n4p  . antiseptic oral rinse  7 mL Mouth Rinse QID  . artificial tears  1 application Both Eyes 3 times per day  . aspirin  81 mg Per Tube Daily  . chlorhexidine  15 mL Mouth Rinse BID  . docusate  100 mg Oral BID  . feeding supplement (PRO-STAT SUGAR FREE 64)  30 mL Per Tube Daily  . insulin aspart  0-20 Units Subcutaneous 6 times per day  . insulin glargine  15 Units Subcutaneous Daily  . meropenem (MERREM) IV  1 g Intravenous Q12H  . methylPREDNISolone (SOLU-MEDROL) injection  80 mg Intravenous 3 times per day  . pantoprazole  (PROTONIX) IV  40 mg Intravenous Daily  . sodium chloride  3 mL Intravenous Q12H  . vancomycin  750 mg Intravenous Q24H   Continuous Infusions: . epinephrine 20 mcg/min (21-Mar-2015 0713)  . feeding supplement (VITAL HIGH PROTEIN) Stopped (2015-03-21 0420)  . fentaNYL infusion INTRAVENOUS 200 mcg/hr (03/15/2015 1825)  . heparin 10,000 units/ 20 mL infusion syringe    . midazolam (VERSED) infusion 3 mg/hr (04/02/2015 1715)  . norepinephrine (LEVOPHED) Adult infusion 50 mcg/min (Mar 21, 2015 0759)  . phenylephrine (NEO-SYNEPHRINE) Adult infusion 200 mcg/min (Mar 21, 2015 0444)  . dialysis replacement fluid (prismasate) 200 mL/hr at 04/05/2015 2024  . dialysis replacement fluid (prismasate) 400 mL/hr at 04/08/2015 2023  . dialysate (PRISMASATE) 1,000 mL/hr at 03-21-15 0759  . vasopressin (PITRESSIN) infusion - *FOR SHOCK* 0.03 Units/min (03/19/2015 2319)   PRN Meds:.sodium chloride, acetaminophen **OR** acetaminophen, bisacodyl, fentaNYL, heparin, heparin, midazolam, nitroGLYCERIN, senna-docusate, sodium chloride, sodium chloride   General appearance: toxic Heart: regular rate and rhythm Lungs: clear anteriorly Abdomen: soft, nondistended Extremities: no sig edema Wound: ok  Lab Results: CBC: Recent Labs  03/27/2015 0520 March 21, 2015 0220  WBC 37.6* 47.7*  HGB 11.2* 12.9*  HCT 36.1* 40.8  PLT  296 311   BMET:  Recent Labs  03/29/2015 1630 2015/04/13 0220  NA 134* 132*  K 5.5* 5.2*  CL 101 97  CO2 22 20  GLUCOSE 181* 181*  BUN 45* 38*  CREATININE 2.59* 2.51*  CALCIUM 7.4* 7.9*    ABG    Component Value Date/Time   PHART 7.261* 04-13-15 0223   PCO2ART 39.0 04/13/2015 0223   PO2ART 68.0* 2015-04-13 0223   HCO3 17.9* 2015/04/13 0223   TCO2 19 04/13/2015 0223   ACIDBASEDEF 9.0* 13-Apr-2015 0223   O2SAT 92.0 Apr 13, 2015 0223      PT/INR: No results for input(s): LABPROT, INR in the last 72 hours. Radiology: Dg Chest Port 1 View  03/26/2015   CLINICAL DATA:  Post bronchoscopy  EXAM: PORTABLE  CHEST - 1 VIEW  COMPARISON:  Portable exam 1545 hours compared to 0500 hours  FINDINGS: Tip of endotracheal tube projects in satisfactory position 5.0 cm above carina.  BILATERAL jugular central venous catheter tips project over SVC.  Nasogastric tube is coiled in stomach.  BILATERAL thoracostomy tubes, single on LEFT and stool on RIGHT.  Persistent collapse of RIGHT lung despite thoracostomy tubes.  No definite mediastinal shift.  Infiltrate again seen throughout RIGHT middle and RIGHT lower lobes as well as in LEFT lower lobe.  No LEFT side pneumothorax identified.  IMPRESSION: Persistent large RIGHT pneumothorax despite pair of RIGHT thoracostomy tubes with persistent collapse of RIGHT lung but without mediastinal shift.  LEFT thoracostomy tube without pneumothorax.  BILATERAL pulmonary infiltrates greater on RIGHT.  Findings called to patient's nurse Lanette Hampshire on [place] on 03/26/2015 at 1613 hours.   Electronically Signed   By: Lavonia Dana M.D.   On: 03/25/2015 16:15   Dg Chest Port 1 View  03/16/2015   CLINICAL DATA:  Pneumothorax.  EXAM: PORTABLE CHEST - 1 VIEW  COMPARISON:  04/05/2015, 03/16/2015.  FINDINGS: Patient rotated to the left. Endotracheal tube, bilateral IJ lines in stable position. Single left chest tube and 2 right chest tubes in stable position. Stable cardiomegaly. Persistent unchanged prominent right pneumothorax. Tension cannot be excluded. Atelectasis of the right lung is present. Left lower lobe atelectasis and/or infiltrate present.  IMPRESSION: 1. Lines and tubes in stable position. Single left chest tube noted. Two right chest tubes noted. These are in stable position. 2. Persistent prominent right pneumothorax. Tension cannot be excluded. No interim change. 3. Persistent collapse of the right lung. Persistent left lower lobe atelectasis and/or infiltrate. No interim change.   Electronically Signed   By: Marcello Moores  Register   On: 03/27/2015 07:04   Dg Chest Port 1 View  04/05/2015    CLINICAL DATA:  Pneumothorax.  EXAM: PORTABLE CHEST - 1 VIEW  COMPARISON:  03/16/2015  FINDINGS: Patient is rotated towards the left. Endotracheal tube with tip measuring 4.7 cm above the carina. Enteric tube tip is off the field of view but below the left hemidiaphragm. Right central venous catheter with tip over the low SVC region. A single left and bilateral right chest tubes are present without change since prior study. Persistent large right pneumothorax with complete collapse of the right lung. Tension is not excluded. Appearance is not significantly changed since previous study, allowing for patient's rotation. No visible left pneumothorax. Cardiac enlargement without vascular congestion.  IMPRESSION: Appliances appear to be in satisfactory location. Persistent large right pneumothorax with collapse of the right lung. No change in appearance.   Electronically Signed   By: Lucienne Capers M.D.   On:  03/20/2015 00:35   Dg Abd Portable 1v  04/01/2015   CLINICAL DATA:  Orogastric tube placement.  EXAM: PORTABLE ABDOMEN - 1 VIEW  COMPARISON:  March 12, 2015.  FINDINGS: Distal tip of feeding tube appears to be in expected position of distal stomach. No bowel dilatation or obstruction is noted.  IMPRESSION: Distal tip of feeding tube appears to be in expected position of distal stomach.   Electronically Signed   By: Marijo Conception, M.D.   On: 03/19/2015 16:12     Assessment/Plan: S/P Procedure(s) (LRB): BEDSIDE  INSERTION OF INTERBRONCHIAL VALVE (IBV) (N/A)  1 family requests chaplain consult- I put in order 2 prognosis poor on maximal medical therapy- continue for now    GOLD,WAYNE E 2015/03/28 8:41 AM Patient seen and examined, agree with above He had further deterioration overnight. At this point he is hypotensive and hypoxic despite maximal support. There is no reasonable hope for recovery/ survival at this point Discussed with Dr. [place] Bells and Mrs Hornbaker- he is now DNR with no further  escalation of care.  Wife wishes to withdraw support once family arrives from out of town She does not wish to have an autopsy performed  Remo Lipps C. Roxan Hockey, MD [facility] [phone]

## 2015-04-12 DEATH — deceased

## 2015-04-29 LAB — AFB CULTURE WITH SMEAR (NOT AT ARMC): ACID FAST SMEAR: NONE SEEN

## 2016-10-26 IMAGING — CT NM PET TUM IMG INITIAL (PI) SKULL BASE T - THIGH
1 of 8 series · 1 of 25 positions shown · non-contrast
Comparison: CT chest 01/29/2015 and CT abdomen pelvis 01/23/2015.

CLINICAL DATA: Initial treatment strategy for lung mass.

EXAM:
NUCLEAR MEDICINE PET SKULL BASE TO THIGH
TECHNIQUE: 9.5 mCi F-18 FDG was injected intravenously. Full-ring PET imaging
was performed from the skull base to thigh after the radiotracer. CT
data was obtained and used for attenuation correction and anatomic
localization.
FASTING BLOOD GLUCOSE:  Value: One hundred three mg/dl

[Series 4: ct sk_thigh 5.0 b31f · axial · 5.0mm · 0.98mm/px · 1 of 217 slices shown]
[im 217/217  brain]
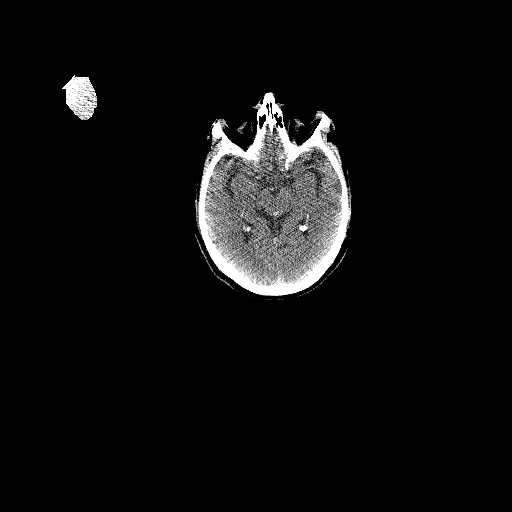

[1 of 25 positions shown; findings below may reference images not displayed]

FINDINGS: NECK

No hypermetabolic lymph nodes in the neck. CT images show no acute
findings.

CHEST

A [DATE] x 4.0 cm spiculated mass in the superior segment left lower
lobe has an SUV max of 15.3. No hypermetabolic mediastinal, hilar or
axillary lymph nodes. CT images show no acute findings. Coronary
artery calcification. Heart is mildly enlarged. No pericardial
effusion. Centrilobular emphysema.

ABDOMEN/PELVIS

No abnormal hypermetabolism in the liver, adrenal glands, spleen or
pancreas. No hypermetabolic lymph nodes. There is focal
hypermetabolism in the low central portion of the prostate. CT
images show a 1.5 cm low-attenuation lesion in the left hepatic
lobe, unchanged from 04/12/2010, favoring a cyst. Cholecystectomy.
Adrenal glands and right kidney are unremarkable. Left renal stones.
A 9 mm stone is again seen in the distal left ureter, at the left
ureterovesical junction. 10 mm stone is seen dependently in the
bladder, stable. Spleen, pancreas, stomach and bowel are grossly
unremarkable. No free fluid. Atherosclerotic calcification of the
arterial vasculature. Infrarenal aortic aneurysm measures 4.3 cm.

SKELETON

No abnormal osseous hypermetabolism. Degenerative changes are seen
in the spine.
IMPRESSION: 1. Hypermetabolic spiculated left lower lobe mass without evidence
of metastatic disease. Finding is most consistent with primary
bronchogenic carcinoma, likely T2a N0 M0 or stage IB disease.
2. Focal hypermetabolism in the low central prostate is nonspecific.
Consider correlation with PSA, as clinically indicated.
3. Coronary artery calcification.
4. Left renal stones. Stones are again seen in the distal left
ureter and bladder.
5. Infrarenal aortic aneurysm, as on 01/23/2015.
